# Patient Record
Sex: Female | Born: 1935 | Race: Black or African American | Hispanic: No | Smoking: Former smoker
Health system: Southern US, Community
[De-identification: ages and names within clinical notes are randomized; demographics above are authoritative.]

## PROBLEM LIST (undated history)

## (undated) DIAGNOSIS — F0280 Dementia in other diseases classified elsewhere without behavioral disturbance: Principal | ICD-10-CM

## (undated) DIAGNOSIS — G309 Alzheimer's disease, unspecified: Principal | ICD-10-CM

## (undated) DIAGNOSIS — F0390 Unspecified dementia without behavioral disturbance: Secondary | ICD-10-CM

## (undated) DIAGNOSIS — E7800 Pure hypercholesterolemia, unspecified: Secondary | ICD-10-CM

## (undated) DIAGNOSIS — I2510 Atherosclerotic heart disease of native coronary artery without angina pectoris: Secondary | ICD-10-CM

## (undated) DIAGNOSIS — I10 Essential (primary) hypertension: Secondary | ICD-10-CM

## (undated) HISTORY — DX: Pure hypercholesterolemia, unspecified: E78.00

## (undated) HISTORY — DX: Alzheimer's disease, unspecified: G30.9

## (undated) HISTORY — PX: CHOLECYSTECTOMY: SHX55

## (undated) HISTORY — DX: Dementia in other diseases classified elsewhere without behavioral disturbance: F02.80

---

## 2001-05-15 ENCOUNTER — Ambulatory Visit (HOSPITAL_COMMUNITY): Admission: RE | Admit: 2001-05-15 | Discharge: 2001-05-15 | Payer: Self-pay | Admitting: Family Medicine

## 2001-05-15 ENCOUNTER — Encounter: Payer: Self-pay | Admitting: Family Medicine

## 2001-08-28 ENCOUNTER — Encounter: Payer: Self-pay | Admitting: Family Medicine

## 2001-08-28 ENCOUNTER — Ambulatory Visit (HOSPITAL_COMMUNITY): Admission: RE | Admit: 2001-08-28 | Discharge: 2001-08-28 | Payer: Self-pay | Admitting: Family Medicine

## 2001-09-06 ENCOUNTER — Ambulatory Visit (HOSPITAL_COMMUNITY): Admission: RE | Admit: 2001-09-06 | Discharge: 2001-09-06 | Payer: Self-pay | Admitting: Family Medicine

## 2001-09-06 ENCOUNTER — Encounter: Payer: Self-pay | Admitting: Family Medicine

## 2005-07-05 ENCOUNTER — Emergency Department (HOSPITAL_COMMUNITY): Admission: EM | Admit: 2005-07-05 | Discharge: 2005-07-06 | Payer: Self-pay | Admitting: Emergency Medicine

## 2005-07-06 ENCOUNTER — Ambulatory Visit: Payer: Self-pay | Admitting: Orthopedic Surgery

## 2005-07-15 ENCOUNTER — Ambulatory Visit (HOSPITAL_COMMUNITY): Admission: RE | Admit: 2005-07-15 | Discharge: 2005-07-15 | Payer: Self-pay | Admitting: Orthopedic Surgery

## 2005-07-15 ENCOUNTER — Ambulatory Visit: Payer: Self-pay | Admitting: Orthopedic Surgery

## 2005-07-20 ENCOUNTER — Ambulatory Visit: Payer: Self-pay | Admitting: Orthopedic Surgery

## 2005-07-24 ENCOUNTER — Inpatient Hospital Stay (HOSPITAL_COMMUNITY): Admission: RE | Admit: 2005-07-24 | Discharge: 2005-07-25 | Payer: Self-pay | Admitting: Orthopedic Surgery

## 2006-07-26 ENCOUNTER — Ambulatory Visit (HOSPITAL_COMMUNITY): Admission: RE | Admit: 2006-07-26 | Discharge: 2006-07-26 | Payer: Self-pay | Admitting: Family Medicine

## 2010-09-04 ENCOUNTER — Emergency Department (HOSPITAL_COMMUNITY)
Admission: EM | Admit: 2010-09-04 | Discharge: 2010-09-04 | Disposition: A | Discharge status: Home / Self Care | Payer: Medicare Other | Attending: Emergency Medicine | Admitting: Emergency Medicine

## 2010-09-04 DIAGNOSIS — Z79899 Other long term (current) drug therapy: Secondary | ICD-10-CM | POA: Insufficient documentation

## 2010-09-04 DIAGNOSIS — R509 Fever, unspecified: Secondary | ICD-10-CM | POA: Insufficient documentation

## 2010-09-04 DIAGNOSIS — R109 Unspecified abdominal pain: Secondary | ICD-10-CM | POA: Insufficient documentation

## 2010-09-04 DIAGNOSIS — E789 Disorder of lipoprotein metabolism, unspecified: Secondary | ICD-10-CM | POA: Insufficient documentation

## 2010-09-04 DIAGNOSIS — I10 Essential (primary) hypertension: Secondary | ICD-10-CM | POA: Insufficient documentation

## 2010-09-04 DIAGNOSIS — Z7982 Long term (current) use of aspirin: Secondary | ICD-10-CM | POA: Insufficient documentation

## 2010-09-04 LAB — BASIC METABOLIC PANEL
CO2: 27 mEq/L (ref 19–32)
Calcium: 9.5 mg/dL (ref 8.4–10.5)
Creatinine, Ser: 1.04 mg/dL (ref 0.4–1.2)
GFR calc Af Amer: >60 mL/min (ref >60)
Glucose, Bld: 142 mg/dL — ABNORMAL HIGH (ref 70–99)

## 2010-09-04 LAB — CBC
Hemoglobin: 13.1 g/dL (ref 12.0–15.0)
MCH: 31.3 pg (ref 26.0–34.0)
MCHC: 34.7 g/dL (ref 30.0–36.0)

## 2010-09-04 LAB — DIFFERENTIAL
Basophils Relative: 0 % (ref 0–1)
Eosinophils Relative: 0 % (ref 0–5)
Monocytes Absolute: 0.6 10*3/uL (ref 0.1–1.0)
Monocytes Relative: 6 % (ref 3–12)
Neutro Abs: 7 10*3/uL (ref 1.7–7.7)

## 2010-09-04 LAB — URINALYSIS, ROUTINE W REFLEX MICROSCOPIC
Leukocytes, UA: NEGATIVE
Nitrite: NEGATIVE
Protein, ur: NEGATIVE mg/dL
Urobilinogen, UA: 0.2 mg/dL (ref 0.0–1.0)

## 2010-09-04 LAB — URINE MICROSCOPIC-ADD ON

## 2010-11-24 ENCOUNTER — Other Ambulatory Visit: Payer: Self-pay | Admitting: Neurology

## 2010-11-24 ENCOUNTER — Other Ambulatory Visit (HOSPITAL_COMMUNITY): Payer: Self-pay | Admitting: "Endocrinology

## 2010-11-24 DIAGNOSIS — R7989 Other specified abnormal findings of blood chemistry: Secondary | ICD-10-CM

## 2010-11-24 DIAGNOSIS — F0390 Unspecified dementia without behavioral disturbance: Secondary | ICD-10-CM

## 2010-11-24 DIAGNOSIS — R4182 Altered mental status, unspecified: Secondary | ICD-10-CM

## 2010-11-25 ENCOUNTER — Encounter (HOSPITAL_COMMUNITY)
Admission: RE | Admit: 2010-11-25 | Discharge: 2010-11-25 | Disposition: A | Discharge status: Home / Self Care | Payer: Medicare Other | Source: Ambulatory Visit | Attending: "Endocrinology | Admitting: "Endocrinology

## 2010-11-25 ENCOUNTER — Encounter (HOSPITAL_COMMUNITY): Payer: Self-pay

## 2010-11-25 DIAGNOSIS — R7989 Other specified abnormal findings of blood chemistry: Secondary | ICD-10-CM

## 2010-11-25 DIAGNOSIS — E349 Endocrine disorder, unspecified: Secondary | ICD-10-CM | POA: Insufficient documentation

## 2010-11-25 HISTORY — DX: Essential (primary) hypertension: I10

## 2010-11-25 MED ORDER — SODIUM IODIDE I 131 CAPSULE
10.0000 | Freq: Once | INTRAVENOUS | Status: AC | PRN
  Administered 2010-11-25: 9 via ORAL

## 2010-11-26 ENCOUNTER — Encounter (HOSPITAL_COMMUNITY)
Admission: RE | Admit: 2010-11-26 | Discharge: 2010-11-26 | Disposition: A | Discharge status: Home / Self Care | Payer: Medicare Other | Source: Ambulatory Visit | Attending: "Endocrinology | Admitting: "Endocrinology

## 2010-11-26 MED ORDER — SODIUM PERTECHNETATE TC 99M INJECTION
10.0000 | Freq: Once | INTRAVENOUS | Status: AC | PRN
  Administered 2010-11-26: 9.6 via INTRAVENOUS

## 2010-12-06 ENCOUNTER — Ambulatory Visit (HOSPITAL_COMMUNITY)
Admission: RE | Admit: 2010-12-06 | Discharge: 2010-12-06 | Disposition: A | Discharge status: Home / Self Care | Payer: Medicare Other | Source: Ambulatory Visit | Attending: Neurology | Admitting: Neurology

## 2010-12-06 DIAGNOSIS — R5383 Other fatigue: Secondary | ICD-10-CM | POA: Insufficient documentation

## 2010-12-06 DIAGNOSIS — R4182 Altered mental status, unspecified: Secondary | ICD-10-CM

## 2010-12-06 DIAGNOSIS — R51 Headache: Secondary | ICD-10-CM | POA: Insufficient documentation

## 2010-12-06 DIAGNOSIS — R5381 Other malaise: Secondary | ICD-10-CM | POA: Insufficient documentation

## 2010-12-06 DIAGNOSIS — F0390 Unspecified dementia without behavioral disturbance: Secondary | ICD-10-CM

## 2010-12-11 ENCOUNTER — Emergency Department (HOSPITAL_COMMUNITY): Payer: Medicare Other

## 2010-12-11 ENCOUNTER — Observation Stay (HOSPITAL_COMMUNITY)
Admission: EM | Admit: 2010-12-11 | Discharge: 2010-12-12 | DRG: 645 | Disposition: A | Discharge status: Home / Self Care | Payer: Medicare Other | Attending: Internal Medicine | Admitting: Internal Medicine

## 2010-12-11 DIAGNOSIS — E785 Hyperlipidemia, unspecified: Secondary | ICD-10-CM | POA: Diagnosis present

## 2010-12-11 DIAGNOSIS — E0590 Thyrotoxicosis, unspecified without thyrotoxic crisis or storm: Principal | ICD-10-CM | POA: Diagnosis present

## 2010-12-11 DIAGNOSIS — Z79899 Other long term (current) drug therapy: Secondary | ICD-10-CM | POA: Insufficient documentation

## 2010-12-11 DIAGNOSIS — F0390 Unspecified dementia without behavioral disturbance: Secondary | ICD-10-CM | POA: Diagnosis present

## 2010-12-11 DIAGNOSIS — Z91199 Patient's noncompliance with other medical treatment and regimen due to unspecified reason: Secondary | ICD-10-CM

## 2010-12-11 DIAGNOSIS — Z9119 Patient's noncompliance with other medical treatment and regimen: Secondary | ICD-10-CM

## 2010-12-11 DIAGNOSIS — I498 Other specified cardiac arrhythmias: Secondary | ICD-10-CM | POA: Diagnosis present

## 2010-12-11 DIAGNOSIS — F411 Generalized anxiety disorder: Secondary | ICD-10-CM | POA: Diagnosis present

## 2010-12-11 DIAGNOSIS — E860 Dehydration: Secondary | ICD-10-CM | POA: Diagnosis present

## 2010-12-11 DIAGNOSIS — I10 Essential (primary) hypertension: Secondary | ICD-10-CM | POA: Diagnosis present

## 2010-12-11 LAB — DIFFERENTIAL
Basophils Absolute: 0 10*3/uL (ref 0.0–0.1)
Basophils Relative: 0 % (ref 0–1)
Eosinophils Relative: 1 % (ref 0–5)
Monocytes Absolute: 0.5 10*3/uL (ref 0.1–1.0)
Neutro Abs: 4.9 10*3/uL (ref 1.7–7.7)

## 2010-12-11 LAB — URINALYSIS, ROUTINE W REFLEX MICROSCOPIC
Ketones, ur: NEGATIVE mg/dL
Leukocytes, UA: NEGATIVE
Nitrite: NEGATIVE
Specific Gravity, Urine: <1.005 — ABNORMAL LOW (ref 1.005–1.030)
pH: 6 (ref 5.0–8.0)

## 2010-12-11 LAB — CBC
MCHC: 33.9 g/dL (ref 30.0–36.0)
RDW: 12.1 % (ref 11.5–15.5)

## 2010-12-11 LAB — URINE MICROSCOPIC-ADD ON

## 2010-12-11 LAB — COMPREHENSIVE METABOLIC PANEL
CO2: 27 mEq/L (ref 19–32)
Calcium: 10.6 mg/dL — ABNORMAL HIGH (ref 8.4–10.5)
Creatinine, Ser: 1.17 mg/dL (ref 0.4–1.2)
GFR calc non Af Amer: 45 mL/min — ABNORMAL LOW (ref >60)
Glucose, Bld: 154 mg/dL — ABNORMAL HIGH (ref 70–99)

## 2010-12-11 LAB — CK TOTAL AND CKMB (NOT AT ARMC)
CK, MB: 3.3 ng/mL (ref 0.3–4.0)
Total CK: 134 U/L (ref 7–177)

## 2010-12-11 LAB — CARDIAC PANEL(CRET KIN+CKTOT+MB+TROPI): Relative Index: 2.4 (ref 0.0–2.5)

## 2010-12-12 LAB — CBC
HCT: 36.9 % (ref 36.0–46.0)
Platelets: 220 10*3/uL (ref 150–400)
RDW: 12.1 % (ref 11.5–15.5)
WBC: 7.2 10*3/uL (ref 4.0–10.5)

## 2010-12-12 LAB — COMPREHENSIVE METABOLIC PANEL
ALT: 9 U/L (ref 0–35)
Albumin: 3.5 g/dL (ref 3.5–5.2)
Alkaline Phosphatase: 58 U/L (ref 39–117)
Glucose, Bld: 160 mg/dL — ABNORMAL HIGH (ref 70–99)
Potassium: 3.8 mEq/L (ref 3.5–5.1)
Sodium: 139 mEq/L (ref 135–145)
Total Protein: 7 g/dL (ref 6.0–8.3)

## 2010-12-12 LAB — CARDIAC PANEL(CRET KIN+CKTOT+MB+TROPI)
CK, MB: 3.1 ng/mL (ref 0.3–4.0)
CK, MB: 2.9 ng/mL (ref 0.3–4.0)
Relative Index: 2.7 — ABNORMAL HIGH (ref 0.0–2.5)
Relative Index: 2.4 (ref 0.0–2.5)
Total CK: 116 U/L (ref 7–177)

## 2010-12-12 LAB — DIFFERENTIAL
Basophils Absolute: 0 10*3/uL (ref 0.0–0.1)
Basophils Relative: 0 % (ref 0–1)
Eosinophils Absolute: 0 10*3/uL (ref 0.0–0.7)
Eosinophils Relative: 0 % (ref 0–5)
Lymphocytes Relative: 27 % (ref 12–46)

## 2010-12-15 NOTE — H&P (Signed)
NAMECARLOYN, LAHUE            ACCOUNT NO.:  [id number]  MEDICAL RECORD NO.:  [id number]           PATIENT TYPE:  I  LOCATION:  A303                          FACILITY:  [facility]  PHYSICIAN:  Talmage Nap, MD  DATE OF BIRTH:  May 14, 1930  DATE OF ADMISSION:  12/11/2010 DATE OF DISCHARGE:  LH                             HISTORY & PHYSICAL   PRIMARY CARE PHYSICIAN:  Madelin Rear. Sherwood Gambler, MD at [facility].  UROLOGIST:  Ky Barban, MD  History obtainable from the patient and the patient's spouse.  CHIEF COMPLAINT:  Hotness, especially on the forehead of unspecified duration.  HISTORY OF PRESENT ILLNESS:  The patient is a 77 year old African- American female with history of hypertension, anxiety disorder, questionable hyperparathyroidism, presenting to the emergency room with complaints of hardness in the forehead of unspecified duration and this has been said to be on and off.  The hotness on forehead is said to be associated with headaches, but there is no history of neck stiffness. There is no history of blurred vision.  No nausea.  No vomiting.  No fever.  No chills.  No rigor.  No chest pain or shortness of breath. Symptoms were said to have persisted and got progressively worse in the past 48 hours.  Hence the patient presented to the emergency room to be evaluated.  PAST MEDICAL HISTORY:  Positive for anxiety disorder, hyperlipidemia, hypertension, questionable hyperparathyroidism.  PAST SURGICAL HISTORY:  Open cholecystectomy.  MEDICATIONS:  Preadmission meds include, 1. Benicar, dose unknown. 2. Donepezil 5 mg p.o. at bedtime. 3. Methimazole 5 mg, frequency unknown. 4. Propranolol 20 mg p.o. b.i.d. 5. Zolpidem 5 mg p.r.n.  ALLERGIES:  No known allergies.  SOCIAL HISTORY:  Negative for alcohol or tobacco use.  The patient is retired.  FAMILY HISTORY:  York Spaniel to be positive for hypertension.  REVIEW OF SYSTEMS:  The patient complained of hotness in  the head with headaches.  Denies any history of neck stiffness.  No nausea or vomiting.  No fever.  No chills.  No rigor.  No chest pain or shortness of breath.  No cough.  No abdominal discomfort.  No diarrhea or hematochezia.  No dysuria or hematuria.  No swelling of the lower extremities.  No intolerance to heat or cold.  No neuropsychiatric disorder.  PHYSICAL EXAMINATION:  GENERAL:  Elderly lady, not in any acute respiratory distress, suboptimal hydration. VITAL SIGNS:  Blood pressure is 156/70, pulse is 65, respiratory rate is 16, temperature is 98.6. HEENT:  Pupils are reactive to light and extraocular muscles are intact. NECK:  No jugular venous distention.  No carotid bruit.  No lymphadenopathy. CHEST:  Clear to auscultation. HEART:  Heart sounds are 1 and 2 distant and bradycardic. ABDOMEN:  Soft and nontender.  Liver, spleen, kidney are not palpable. Bowel sounds are positive. EXTREMITIES:  No pedal edema. NEUROLOGIC:  Nonfocal. MUSCULOSKELETAL SYSTEM:  Shows arthritic changes in the knees and feet. NEUROPSYCHIATRIC:  Evaluation is unremarkable. SKIN:  Showed decreased turgor.  LABORATORY DATA:  Urinalysis showed the urine, wbc 3-6 and rbc 3-6 and negative urine nitrite and leukocyte esterase.  Chemistry shows sodium of  134, potassium of 3.6, chloride of 97 with a bicarb of 27, glucose is 154, BUN is 18, creatinine is 1.17.  LFT normal.  Hematological indices showed WBC of 7.8, hemoglobin of 12.5, hematocrit of 36.9, MCV of 90.2, platelet count of 263.  Normal differentials.  First set of cardiac markers troponin-I less than 0.30.  EKG showed a sinus bradycardia with a rate of 51 with nonspecific T-wave abnormalities in anterolateral leads.  IMPRESSION: 1. Hotness (forehead), questionable secondary to thyroid disorder. 2. Asymptomatic bradycardia. 3. Dehydration. 4. Hypertension. 5. Anxiety disorder. 6. Hyperlipidemia.  PLAN:  To admit the patient to telemetry  for continuous monitoring of the slow heart rate.  She will be slowly rehydrated with half-normal saline IV to go at rate of 65 mL an hour.  Blood pressure will be controlled with Benicar 20 mg p.o. daily, propranolol and methimazole have been hold for now.  She will be on Protonix 40 mg p.o. daily for GI prophylaxis and Lovenox 40 mg subcu q. 24 for DVT prophylaxis.  Further labs to be ordered on this patient will include TSH, T3, T4, cardiac enzymes q. 6 x3.  CBC, CMP, and magnesium will be repeated in a.m.  The patient will be followed and evaluated on daily basis.     Talmage Nap, MD    CN/MEDQ  D:  12/11/2010  T:  12/11/2010  Job:  [id number]  Electronically Signed by Talmage Nap  on 12/15/2010 11:41:21 AM

## 2010-12-23 NOTE — Discharge Summary (Signed)
[name], [name]            ACCOUNT NO.:  [id number]  MEDICAL RECORD NO.:  [id number]           PATIENT TYPE:  I  LOCATION:  A303                          FACILITY:  [facility]  PHYSICIAN:  Chamberlain Steinborn L. Lendell Caprice, MDDATE OF BIRTH:  24-Aug-1929  DATE OF ADMISSION:  12/11/2010 DATE OF DISCHARGE:  05/27/2012LH                              DISCHARGE SUMMARY   DISCHARGE DIAGNOSES: 1. Resolved bradycardia. 2. Thyrotoxicosis. 3. Noncompliance. 4. Reported history of dementia. 5. Anxiety. 6. Hypertension. 7. Hyperlipidemia. 8. Dehydration.  DISCHARGE MEDICATIONS: 1. She is encouraged to take her methimazole 5 mg a day which her     family reports she has not been taking. 2. Stop propranolol (though I doubt she has been taking this). 3. Zolpidem 5 mg nightly as needed for sleep. 4. Benicar HCT 40/25 mg a day. 5. Donepezil 5 mg a day. 6. Alprazolam 0.5 mg p.o. q.6 hours p.r.n. anxiety.  CONDITION:  Stable.  ACTIVITY:  Ad lib.  FOLLOWUP:  With Dr. Fransico Him, next week.  CONSULTATIONS:  None.  PROCEDURES:  None.  DIET:  Should be heart-healthy.  LABORATORY DATA:  TSH, free T4 pending.  Cardiac enzymes negative. Complete metabolic panel significant for glucose of 154 on admission, otherwise unremarkable CBC normal.  Urinalysis showed specific gravity less than 1.005, small blood.  Otherwise essentially negative.  DIAGNOSTICS:  Chest x-ray negative.  EKG showed sinus bradycardia with a rate of 51.  HISTORY AND HOSPITAL COURSE:  Please see H and P for details.  [name] is a 77 year old black female who apparently was diagnosed with hyperthyroidism earlier this more month.  Family tells me it was being worked up by Dr. Fransico Him.  She had thyroid uptake scan earlier this month which showed no hyperfunctioning or hypofunctioning nodules.  The patient presented with feeling hot.  The family reports she has a history of "dementia."  During the hospitalization, she took out her IV and  to off her telemetry leads and was wandering.  I suspect her heat intolerance is related to the noncompliance with her methimazole.  Some of her behavioral problems could be related as well.  At this point, she is more difficult to redirect and what she would be at home and she is medically stabilized, so the rest of her workup can be followed up as an outpatient.  I suspect this is simply secondary to noncompliance and I have asked the family members to assist with her medication compliance. I have told her to stop her propranolol for now due to the mild bradycardia.  This will need to be readdressed by Dr. Fransico Him, and I have asked her to schedule an appointment next week.  Apparently, the patient's husband and son help with her medical issues.  She has had no further bradycardia.  She is ruled out for MI.  She feels well otherwise and wants to go home.  Total time on the day of discharge is greater than 30 minutes.     Koltin Wehmeyer L. Lendell Caprice, MD     CLS/MEDQ  D:  12/12/2010  T:  12/13/2010  Job:  [id number]  cc:   Madelin Rear. Sherwood Gambler, MD  Fax: [phone]  Dr. Darliss Cheney  Electronically Signed by Crista Curb MD on 12/23/2010 07:25:47 AM

## 2011-04-11 ENCOUNTER — Other Ambulatory Visit (HOSPITAL_COMMUNITY): Payer: Self-pay | Admitting: Internal Medicine

## 2011-04-11 DIAGNOSIS — Z139 Encounter for screening, unspecified: Secondary | ICD-10-CM

## 2011-04-14 ENCOUNTER — Ambulatory Visit (HOSPITAL_COMMUNITY)
Admission: RE | Admit: 2011-04-14 | Discharge: 2011-04-14 | Disposition: A | Discharge status: Home / Self Care | Payer: Medicare Other | Source: Ambulatory Visit | Attending: Internal Medicine | Admitting: Internal Medicine

## 2011-04-14 DIAGNOSIS — Z139 Encounter for screening, unspecified: Secondary | ICD-10-CM

## 2011-04-14 DIAGNOSIS — Z13820 Encounter for screening for osteoporosis: Secondary | ICD-10-CM | POA: Insufficient documentation

## 2011-04-14 DIAGNOSIS — Z1231 Encounter for screening mammogram for malignant neoplasm of breast: Secondary | ICD-10-CM | POA: Insufficient documentation

## 2011-04-14 DIAGNOSIS — Z780 Asymptomatic menopausal state: Secondary | ICD-10-CM | POA: Insufficient documentation

## 2011-04-14 DIAGNOSIS — M818 Other osteoporosis without current pathological fracture: Secondary | ICD-10-CM | POA: Insufficient documentation

## 2011-06-02 ENCOUNTER — Other Ambulatory Visit (HOSPITAL_COMMUNITY): Payer: Self-pay | Admitting: Internal Medicine

## 2011-06-02 DIAGNOSIS — Z139 Encounter for screening, unspecified: Secondary | ICD-10-CM

## 2011-06-27 ENCOUNTER — Ambulatory Visit: Payer: Medicare Other | Admitting: Gastroenterology

## 2011-06-27 ENCOUNTER — Telehealth: Payer: Self-pay | Admitting: Gastroenterology

## 2011-06-27 NOTE — Telephone Encounter (Signed)
Pt was a no show

## 2011-06-27 NOTE — Telephone Encounter (Signed)
Let referring MD know.

## 2011-08-17 ENCOUNTER — Encounter (INDEPENDENT_AMBULATORY_CARE_PROVIDER_SITE_OTHER): Payer: Self-pay | Admitting: *Deleted

## 2011-08-30 ENCOUNTER — Encounter (INDEPENDENT_AMBULATORY_CARE_PROVIDER_SITE_OTHER): Payer: Self-pay | Admitting: Internal Medicine

## 2011-08-30 ENCOUNTER — Telehealth (INDEPENDENT_AMBULATORY_CARE_PROVIDER_SITE_OTHER): Payer: Self-pay | Admitting: *Deleted

## 2011-08-30 ENCOUNTER — Other Ambulatory Visit (INDEPENDENT_AMBULATORY_CARE_PROVIDER_SITE_OTHER): Payer: Self-pay | Admitting: *Deleted

## 2011-08-30 ENCOUNTER — Ambulatory Visit (INDEPENDENT_AMBULATORY_CARE_PROVIDER_SITE_OTHER): Payer: Medicare Other | Admitting: Internal Medicine

## 2011-08-30 DIAGNOSIS — Z1211 Encounter for screening for malignant neoplasm of colon: Secondary | ICD-10-CM

## 2011-08-30 DIAGNOSIS — E7800 Pure hypercholesterolemia, unspecified: Secondary | ICD-10-CM | POA: Insufficient documentation

## 2011-08-30 DIAGNOSIS — I10 Essential (primary) hypertension: Secondary | ICD-10-CM | POA: Insufficient documentation

## 2011-08-30 DIAGNOSIS — E119 Type 2 diabetes mellitus without complications: Secondary | ICD-10-CM | POA: Insufficient documentation

## 2011-08-30 MED ORDER — PEG-KCL-NACL-NASULF-NA ASC-C 100 G PO SOLR: 1.0000 | Freq: Once | ORAL | Status: DC

## 2011-08-30 NOTE — Telephone Encounter (Signed)
Patient needs movi prep 

## 2011-08-30 NOTE — Patient Instructions (Addendum)
Will schedule a colonoscopy with Dr. Rehman 

## 2011-08-30 NOTE — Progress Notes (Signed)
Subjective:     Patient ID: [name], female   DOB: May 06, 1930, 78 y.o.   MRN: [id number]  HPI [name] is a referral from [facility] because her stomach growls.  She says her abdomen is very loud.  She denies any pain. Appetite is good. She says she has lost weight intentionally. She occasionally has burping.  She has a BM about every 2 days. Stools are normal in size. Sometimes her stools are pencil like.  No melena or bright red rectal bleeding. No pain with her BMs.   Her husband states her last colonoscopy was greater than 10 yrs ago by Dr. Katrinka Blazing and was normal.  Review of Systems see hpi  Current Outpatient Prescriptions  Medication Sig Dispense Refill  . ALPRAZolam (XANAX) 0.5 MG tablet Take 0.5 mg by mouth at bedtime as needed.      Marland Kitchen aspirin 81 MG tablet Take 81 mg by mouth daily.      Marland Kitchen donepezil (ARICEPT) 5 MG tablet Take 5 mg by mouth at bedtime as needed.      . lactulose (CHRONULAC) 10 GM/15ML solution Take 20 g by mouth 3 (three) times daily. As needed      . olmesartan-hydrochlorothiazide (BENICAR HCT) 40-25 MG per tablet Take 1 tablet by mouth daily.      . propranolol (INDERAL) 20 MG tablet Take 20 mg by mouth 3 (three) times daily.      . simvastatin (ZOCOR) 20 MG tablet Take 20 mg by mouth every evening.      . zolpidem (AMBIEN) 5 MG tablet Take 5 mg by mouth at bedtime as needed.       Medications:  Methimazole 5mg  daily Past Medical History  Diagnosis Date  . Hypertension   . High cholesterol   . Diabetes mellitus    Past Surgical History  Procedure Date  . Cholecystectomy    Family Status  Relation Status Death Age  . Mother Deceased     natural  . Father Deceased     unknown  . Sister Deceased   . Brother Other     Patient really cannot give health status on her brothers   History   Social History  . Marital Status: Married    Spouse Name: N/A    Number of Children: N/A  . Years of Education: N/A   Occupational History  . Not on  file.   Social History Main Topics  . Smoking status: Former Games developer  . Smokeless tobacco: Not on file  . Alcohol Use: No  . Drug Use: No  . Sexually Active: Not on file   Other Topics Concern  . Not on file   Social History Narrative  . No narrative on file   No Known Allergies   Objective:   Physical Exam  Filed Vitals:   08/30/11 1035  Height: 5\' 9"  (1.753 m)  Weight: 169 lb 4.8 oz (76.794 kg)   Alert and oriented. Skin warm and dry. Oral mucosa is moist.   . Sclera anicteric, conjunctivae is pink. Thyroid not enlarged. No cervical lymphadenopathy. Lungs clear. Heart regular rate and rhythm.  Abdomen is soft. Bowel sounds are positive. No hepatomegaly. No abdominal masses felt. No tenderness.  No edema to lower extremities. Patient is alert and oriented.      Assessment:       Normal Exam. Colonoscopy greater than 10 yrs ago.  Plan:    Will schedule a colonoscopy with Dr. Karilyn Cota.    The  risks and benefits such as perforation, bleeding, and infection were reviewed with the patient and is agreeable.

## 2011-09-26 ENCOUNTER — Telehealth (INDEPENDENT_AMBULATORY_CARE_PROVIDER_SITE_OTHER): Payer: Self-pay | Admitting: *Deleted

## 2011-09-26 ENCOUNTER — Encounter (HOSPITAL_COMMUNITY): Payer: Self-pay | Admitting: Pharmacy Technician

## 2011-09-26 NOTE — Telephone Encounter (Signed)
Patient states pharmacy didn't have prep kit, please re-send

## 2011-09-27 MED ORDER — PEG-KCL-NACL-NASULF-NA ASC-C 100 G PO SOLR: 1.0000 | Freq: Once | ORAL | Status: DC

## 2011-09-27 MED ORDER — SODIUM CHLORIDE 0.45 % IV SOLN
Freq: Once | INTRAVENOUS | Status: AC
  Administered 2011-09-28: 1000 mL via INTRAVENOUS

## 2011-09-28 ENCOUNTER — Ambulatory Visit (HOSPITAL_COMMUNITY)
Admission: RE | Admit: 2011-09-28 | Discharge: 2011-09-28 | Disposition: A | Discharge status: Home / Self Care | Payer: Medicare Other | Source: Ambulatory Visit | Attending: Internal Medicine | Admitting: Internal Medicine

## 2011-09-28 ENCOUNTER — Encounter (HOSPITAL_COMMUNITY)
Admission: RE | Disposition: A | Discharge status: Home / Self Care | Payer: Self-pay | Source: Ambulatory Visit | Attending: Internal Medicine

## 2011-09-28 ENCOUNTER — Encounter (HOSPITAL_COMMUNITY): Payer: Self-pay | Admitting: *Deleted

## 2011-09-28 DIAGNOSIS — D128 Benign neoplasm of rectum: Secondary | ICD-10-CM

## 2011-09-28 DIAGNOSIS — Z1211 Encounter for screening for malignant neoplasm of colon: Secondary | ICD-10-CM

## 2011-09-28 DIAGNOSIS — Z7982 Long term (current) use of aspirin: Secondary | ICD-10-CM | POA: Insufficient documentation

## 2011-09-28 DIAGNOSIS — Z79899 Other long term (current) drug therapy: Secondary | ICD-10-CM | POA: Insufficient documentation

## 2011-09-28 DIAGNOSIS — I10 Essential (primary) hypertension: Secondary | ICD-10-CM | POA: Insufficient documentation

## 2011-09-28 DIAGNOSIS — D129 Benign neoplasm of anus and anal canal: Secondary | ICD-10-CM

## 2011-09-28 DIAGNOSIS — E119 Type 2 diabetes mellitus without complications: Secondary | ICD-10-CM | POA: Insufficient documentation

## 2011-09-28 DIAGNOSIS — E7800 Pure hypercholesterolemia, unspecified: Secondary | ICD-10-CM | POA: Insufficient documentation

## 2011-09-28 DIAGNOSIS — K644 Residual hemorrhoidal skin tags: Secondary | ICD-10-CM

## 2011-09-28 DIAGNOSIS — K5730 Diverticulosis of large intestine without perforation or abscess without bleeding: Secondary | ICD-10-CM

## 2011-09-28 HISTORY — PX: COLONOSCOPY: SHX5424

## 2011-09-28 SURGERY — COLONOSCOPY
Anesthesia: Moderate Sedation

## 2011-09-28 MED ORDER — NYSTATIN-TRIAMCINOLONE 100000-0.1 UNIT/GM-% EX OINT: TOPICAL_OINTMENT | Freq: Two times a day (BID) | CUTANEOUS | Status: DC

## 2011-09-28 MED ORDER — MEPERIDINE HCL 50 MG/ML IJ SOLN
INTRAMUSCULAR | Status: AC
  Filled 2011-09-28: qty 1

## 2011-09-28 MED ORDER — MIDAZOLAM HCL 5 MG/5ML IJ SOLN
INTRAMUSCULAR | Status: DC | PRN
  Administered 2011-09-28 (×2): 2 mg via INTRAVENOUS
  Administered 2011-09-28: 1 mg via INTRAVENOUS

## 2011-09-28 MED ORDER — STERILE WATER FOR IRRIGATION IR SOLN
Status: DC | PRN
  Administered 2011-09-28: 11:00:00

## 2011-09-28 MED ORDER — MIDAZOLAM HCL 5 MG/5ML IJ SOLN
INTRAMUSCULAR | Status: AC
  Filled 2011-09-28: qty 10

## 2011-09-28 MED ORDER — MEPERIDINE HCL 50 MG/ML IJ SOLN
INTRAMUSCULAR | Status: DC | PRN
  Administered 2011-09-28 (×2): 25 mg via INTRAVENOUS

## 2011-09-28 NOTE — Discharge Instructions (Signed)
Resume usual medications and high fiber diet. Mycolog-II cream to be applied to anal canal twice daily for 10 days and then on as-needed basis. No driving for 24 hours. Physician will contact you with biopsy results.  High Fiber Diet A high fiber diet changes your normal diet to include more whole grains, legumes, fruits, and vegetables. Changes in the diet involve replacing refined carbohydrates with unrefined foods. The calorie level of the diet is essentially unchanged. The Dietary Reference Intake (recommended amount) for adult males is 38 g per day. For adult females, it is 25 g per day. Pregnant and lactating women should consume 28 g of fiber per day. Fiber is the intact part of a plant that is not broken down during digestion. Functional fiber is fiber that has been isolated from the plant to provide a beneficial effect in the body. PURPOSE  Increase stool bulk.   Ease and regulate bowel movements.   Lower cholesterol.  INDICATIONS THAT YOU NEED MORE FIBER  Constipation and hemorrhoids.   Uncomplicated diverticulosis (intestine condition) and irritable bowel syndrome.   Weight management.   As a protective measure against hardening of the arteries (atherosclerosis), diabetes, and cancer.  NOTE OF CAUTION If you have a digestive or bowel problem, ask your caregiver for advice before adding high fiber foods to your diet. Some of the following medical problems are such that a high fiber diet should not be used without consulting your caregiver:  Acute diverticulitis (intestine infection).   Partial small bowel obstructions.   Complicated diverticular disease involving bleeding, rupture (perforation), or abscess (boil, furuncle).   Presence of autonomic neuropathy (nerve damage) or gastric paresis (stomach cannot empty itself).  GUIDELINES FOR INCREASING FIBER  Start adding fiber to the diet slowly. A gradual increase of about 5 more grams (2 slices of whole-wheat bread, 2  servings of most fruits or vegetables, or 1 bowl of high fiber cereal) per day is best. Too rapid an increase in fiber may result in constipation, flatulence, and bloating.   Drink enough water and fluids to keep your urine clear or pale yellow. Water, juice, or caffeine-free drinks are recommended. Not drinking enough fluid may cause constipation.   Eat a variety of high fiber foods rather than one type of fiber.   Try to increase your intake of fiber through using high fiber foods rather than fiber pills or supplements that contain small amounts of fiber.   The goal is to change the types of food eaten. Do not supplement your present diet with high fiber foods, but replace foods in your present diet.  INCLUDE A VARIETY OF FIBER SOURCES  Replace refined and processed grains with whole grains, canned fruits with fresh fruits, and incorporate other fiber sources. White rice, white breads, and most bakery goods contain little or no fiber.   Brown whole-grain rice, buckwheat oats, and many fruits and vegetables are all good sources of fiber. These include: broccoli, Brussels sprouts, cabbage, cauliflower, beets, sweet potatoes, white potatoes (skin on), carrots, tomatoes, eggplant, squash, berries, fresh fruits, and dried fruits.   Cereals appear to be the richest source of fiber. Cereal fiber is found in whole grains and bran. Bran is the fiber-rich outer coat of cereal grain, which is largely removed in refining. In whole-grain cereals, the bran remains. In breakfast cereals, the largest amount of fiber is found in those with "bran" in their names. The fiber content is sometimes indicated on the label.   You may need to include  additional fruits and vegetables each day.   In baking, for 1 cup white flour, you may use the following substitutions:   1 cup whole-wheat flour minus 2 tbs.    cup white flour plus  cup whole-wheat flour.  Document Released: 07/04/2005 Document Revised: 06/23/2011  Document Reviewed: 05/12/2009 [facility] Patient Information 2012 [place], [place].   Colonoscopy Care After Read the instructions outlined below and refer to this sheet in the next few weeks. These discharge instructions provide you with general information on caring for yourself after you leave the hospital. Your doctor may also give you specific instructions. While your treatment has been planned according to the most current medical practices available, unavoidable complications occasionally occur. If you have any problems or questions after discharge, call your doctor. HOME CARE INSTRUCTIONS ACTIVITY:  You may resume your regular activity, but move at a slower pace for the next 24 hours.   Take frequent rest periods for the next 24 hours.   Walking will help get rid of the air and reduce the bloated feeling in your belly (abdomen).   No driving for 24 hours (because of the medicine (anesthesia) used during the test).   You may shower.   Do not sign any important legal documents or operate any machinery for 24 hours (because of the anesthesia used during the test).  NUTRITION:  Drink plenty of fluids.   You may resume your normal diet as instructed by your doctor.   Begin with a light meal and progress to your normal diet. Heavy or fried foods are harder to digest and may make you feel sick to your stomach (nauseated).   Avoid alcoholic beverages for 24 hours or as instructed.  MEDICATIONS:  You may resume your normal medications unless your doctor tells you otherwise.  WHAT TO EXPECT TODAY:  Some feelings of bloating in the abdomen.   Passage of more gas than usual.   Spotting of blood in your stool or on the toilet paper.  IF YOU HAD POLYPS REMOVED DURING THE COLONOSCOPY:  No aspirin products for 7 days or as instructed.   No alcohol for 7 days or as instructed.   Eat a soft diet for the next 24 hours.  FINDING OUT THE RESULTS OF YOUR TEST Not all test results are  available during your visit. If your test results are not back during the visit, make an appointment with your caregiver to find out the results. Do not assume everything is normal if you have not heard from your caregiver or the medical facility. It is important for you to follow up on all of your test results.  SEEK IMMEDIATE MEDICAL CARE IF:  You have more than a spotting of blood in your stool.   Your belly is swollen (abdominal distention).   You are nauseated or vomiting.   You have a fever.   You have abdominal pain or discomfort that is severe or gets worse throughout the day.  Document Released: 02/16/2004 Document Revised: 06/23/2011 Document Reviewed: 02/14/2008 [facility] Patient Information 2012 [place], [place].

## 2011-09-28 NOTE — H&P (Signed)
[name] is an 78 y.o. female.   Chief Complaint: patient is here for colonoscopy. HPI: patient is 78 year old female who is in for screening colonoscopy.Last  xam was over 10 years ago. She complains of abdominal rumbling and constipation. She denies rectal bleeding melena anorexia or weight loss. Family history is negative for colorectal carcinoma. Please see office note from 08/30/2011 for this medications.  Past Medical History  Diagnosis Date  . Hypertension   . High cholesterol   . Diabetes mellitus     Past Surgical History  Procedure Date  . Cholecystectomy     History reviewed. No pertinent family history. Social History:  reports that she has quit smoking. She does not have any smokeless tobacco history on file. She reports that she does not drink alcohol or use illicit drugs.  Allergies: No Known Allergies  Medications Prior to Admission  Medication Dose Route Frequency Provider Last Rate Last Dose  . 0.45 % sodium chloride infusion   Intravenous Once Malissa Hippo, MD 20 mL/hr at 09/28/11 1111 1,000 mL at 09/28/11 1111  . meperidine (DEMEROL) 50 MG/ML injection           . midazolam (VERSED) 5 MG/5ML injection           . simethicone susp in sterile water 1000 mL irrigation    PRN Malissa Hippo, MD       No current outpatient prescriptions on file as of 09/28/2011.    No results found for this or any previous visit (from the past 48 hour(s)). No results found.  Review of Systems  Constitutional: Negative for weight loss.  Gastrointestinal: Negative for abdominal pain, diarrhea, constipation, blood in stool and melena.    Blood pressure 164/84, pulse 105, temperature 97.9 F (36.6 C), temperature source Oral, resp. rate 20, height 5\' 4"  (1.626 m), weight 167 lb (75.751 kg), SpO2 100.00%. Physical Exam  Constitutional: She appears well-developed and well-nourished.  HENT:  Mouth/Throat: Oropharynx is clear and moist.  Eyes: Conjunctivae are normal.  No scleral icterus.  Neck: No thyromegaly present.  Cardiovascular: Normal rate, regular rhythm and normal heart sounds.   No murmur heard. Respiratory: Effort normal and breath sounds normal.  GI: Soft. She exhibits no distension. There is no tenderness.  Musculoskeletal: She exhibits no edema.  Lymphadenopathy:    She has no cervical adenopathy.  Neurological: She is alert.  Skin: Skin is warm and dry.     Assessment/Plan Average risk screening colonoscopy.  Truxton Stupka U 09/28/2011, 11:26 AM

## 2011-09-28 NOTE — Op Note (Addendum)
COLONOSCOPY PROCEDURE REPORT  PATIENT:  [name]  MR#:  [id number] Birthdate:  05/19/1930, 78 y.o., female Endoscopist:  Dr. Malissa Hippo, MD Referred By:  Dr. Madelin Rear. Sherwood Gambler, MD Procedure Date: 09/28/2011  Procedure:   Colonoscopy  Indications: patient is 78 year old African female who is undergoing colonoscopy for screening purposes. He complains of abdominal rumbling and has chronic constipation for which she is on lactulose.  Informed Consent:  Procedure and risks were reviewed with the patient and informed consent was obtained.  Medications:  Demerol 50 mg IV Versed 5 mg IV  Description of procedure:  After a digital rectal exam was performed, that colonoscope was advanced from the anus through the rectum and colon to the area of the cecum, ileocecal valve and appendiceal orifice. The cecum was deeply intubated. These structures were well-seen and photographed for the record. From the level of the cecum and ileocecal valve, the scope was slowly and cautiously withdrawn. The mucosal surfaces were carefully surveyed utilizing scope tip to flexion to facilitate fold flattening as needed. The scope was pulled down into the rectum where a thorough exam including retroflexion was performed.  Findings:   Prep excellent. Few small diverticula and sigmoid colon. 3-4 mm polyp ablated via cold biopsy from rectum. Small hemorrhoids below the dentate line.  Therapeutic/Diagnostic Maneuvers Performed:  See above  Complications:  none  Cecal Withdrawal Time:  11 minutes  Impression:  Examination performed to cecum. Few small diverticula at sigmoid colon. Small rectal polyp ablated via cold biopsy. External hemorrhoids.  Recommendations:  Standard instructions given. Mycolog-II cream. Apply to anal canal twice daily for 10 days and thereafter when necessary. I will contact patient with results of biopsy.  Pasty Manninen U  09/28/2011 12:01 PM  CC: Dr. Cassell Smiles.,  MD, MD & Dr. Bonnetta Barry ref. provider found

## 2011-09-30 ENCOUNTER — Encounter (HOSPITAL_COMMUNITY): Payer: Self-pay | Admitting: Internal Medicine

## 2011-10-10 ENCOUNTER — Encounter (INDEPENDENT_AMBULATORY_CARE_PROVIDER_SITE_OTHER): Payer: Self-pay | Admitting: *Deleted

## 2011-12-07 NOTE — Patient Instructions (Addendum)
Your procedure is scheduled on: 12/13/2011  Report to [facility] at  800      AM.  Call this number if you have problems the morning of surgery: [phone]   Do not eat food or drink liquids :After Midnight.      Take these medicines the morning of surgery with A SIP OF WATER:xanax,aricept,benicar,propranolol   Do not wear jewelry, make-up or nail polish.  Do not wear lotions, powders, or perfumes. You may wear deodorant.  Do not shave 48 hours prior to surgery.  Do not bring valuables to the hospital.  Contacts, dentures or bridgework may not be worn into surgery.  Leave suitcase in the car. After surgery it may be brought to your room.  For patients admitted to the hospital, checkout time is 11:00 AM the day of discharge.   Patients discharged the day of surgery will not be allowed to drive home.  :     Please read over the following fact sheets that you were given: Coughing and Deep Breathing, Surgical Site Infection Prevention, Anesthesia Post-op Instructions and Care and Recovery After Surgery    Cataract A cataract is a clouding of the lens of the eye. When a lens becomes cloudy, vision is reduced based on the degree and nature of the clouding. Many cataracts reduce vision to some degree. Some cataracts make people more near-sighted as they develop. Other cataracts increase glare. Cataracts that are ignored and become worse can sometimes look white. The white color can be seen through the pupil. CAUSES   Aging. However, cataracts may occur at any age, even in newborns.   Certain drugs.   Trauma to the eye.   Certain diseases such as diabetes.   Specific eye diseases such as chronic inflammation inside the eye or a sudden attack of a rare form of glaucoma.   Inherited or acquired medical problems.  SYMPTOMS   Gradual, progressive drop in vision in the affected eye.   Severe, rapid visual loss. This most often happens when trauma is the cause.  DIAGNOSIS  To detect a  cataract, an eye doctor examines the lens. Cataracts are best diagnosed with an exam of the eyes with the pupils enlarged (dilated) by drops.  TREATMENT  For an early cataract, vision may improve by using different eyeglasses or stronger lighting. If that does not help your vision, surgery is the only effective treatment. A cataract needs to be surgically removed when vision loss interferes with your everyday activities, such as driving, reading, or watching TV. A cataract may also have to be removed if it prevents examination or treatment of another eye problem. Surgery removes the cloudy lens and usually replaces it with a substitute lens (intraocular lens, IOL).  At a time when both you and your doctor agree, the cataract will be surgically removed. If you have cataracts in both eyes, only one is usually removed at a time. This allows the operated eye to heal and be out of danger from any possible problems after surgery (such as infection or poor wound healing). In rare cases, a cataract may be doing damage to your eye. In these cases, your caregiver may advise surgical removal right away. The vast majority of people who have cataract surgery have better vision afterward. HOME CARE INSTRUCTIONS  If you are not planning surgery, you may be asked to do the following:  Use different eyeglasses.   Use stronger or brighter lighting.   Ask your eye doctor about reducing your  medicine dose or changing medicines if it is thought that a medicine caused your cataract. Changing medicines does not make the cataract go away on its own.   Become familiar with your surroundings. Poor vision can lead to injury. Avoid bumping into things on the affected side. You are at a higher risk for tripping or falling.   Exercise extreme care when driving or operating machinery.   Wear sunglasses if you are sensitive to bright light or experiencing problems with glare.  SEEK IMMEDIATE MEDICAL CARE IF:   You have a  worsening or sudden vision loss.   You notice redness, swelling, or increasing pain in the eye.   You have a fever.  Document Released: 07/04/2005 Document Revised: 06/23/2011 Document Reviewed: 02/25/2011 [facility] Patient Information 2012 [place], [place].PATIENT INSTRUCTIONS POST-ANESTHESIA  IMMEDIATELY FOLLOWING SURGERY:  Do not drive or operate machinery for the first twenty four hours after surgery.  Do not make any important decisions for twenty four hours after surgery or while taking narcotic pain medications or sedatives.  If you develop intractable nausea and vomiting or a severe headache please notify your doctor immediately.  FOLLOW-UP:  Please make an appointment with your surgeon as instructed. You do not need to follow up with anesthesia unless specifically instructed to do so.  WOUND CARE INSTRUCTIONS (if applicable):  Keep a dry clean dressing on the anesthesia/puncture wound site if there is drainage.  Once the wound has quit draining you may leave it open to air.  Generally you should leave the bandage intact for twenty four hours unless there is drainage.  If the epidural site drains for more than 36-48 hours please call the anesthesia department.  QUESTIONS?:  Please feel free to call your physician or the hospital operator if you have any questions, and they will be happy to assist you.     [facility] Anesthesia Department [address] [place] [phone]

## 2011-12-08 ENCOUNTER — Encounter (HOSPITAL_COMMUNITY)
Admission: RE | Admit: 2011-12-08 | Discharge: 2011-12-08 | Disposition: A | Discharge status: Home / Self Care | Payer: Medicare Other | Source: Ambulatory Visit | Attending: Ophthalmology | Admitting: Ophthalmology

## 2011-12-08 ENCOUNTER — Other Ambulatory Visit: Payer: Self-pay

## 2011-12-08 ENCOUNTER — Encounter (HOSPITAL_COMMUNITY): Payer: Self-pay

## 2011-12-08 LAB — BASIC METABOLIC PANEL
CO2: 28 mEq/L (ref 19–32)
Chloride: 103 mEq/L (ref 96–112)
Glucose, Bld: 126 mg/dL — ABNORMAL HIGH (ref 70–99)
Sodium: 141 mEq/L (ref 135–145)

## 2011-12-08 LAB — HEMOGLOBIN AND HEMATOCRIT, BLOOD
HCT: 37.1 % (ref 36.0–46.0)
Hemoglobin: 12.7 g/dL (ref 12.0–15.0)

## 2011-12-13 ENCOUNTER — Ambulatory Visit (HOSPITAL_COMMUNITY)
Admission: RE | Admit: 2011-12-13 | Discharge: 2011-12-13 | Disposition: A | Discharge status: Home / Self Care | Payer: Medicare Other | Source: Ambulatory Visit | Attending: Ophthalmology | Admitting: Ophthalmology

## 2011-12-13 ENCOUNTER — Encounter (HOSPITAL_COMMUNITY): Payer: Self-pay | Admitting: *Deleted

## 2011-12-13 ENCOUNTER — Encounter (HOSPITAL_COMMUNITY): Payer: Self-pay | Admitting: Anesthesiology

## 2011-12-13 ENCOUNTER — Encounter (HOSPITAL_COMMUNITY)
Admission: RE | Disposition: A | Discharge status: Home / Self Care | Payer: Self-pay | Source: Ambulatory Visit | Attending: Ophthalmology

## 2011-12-13 ENCOUNTER — Ambulatory Visit (HOSPITAL_COMMUNITY): Payer: Medicare Other | Admitting: Anesthesiology

## 2011-12-13 DIAGNOSIS — I10 Essential (primary) hypertension: Secondary | ICD-10-CM | POA: Insufficient documentation

## 2011-12-13 DIAGNOSIS — Z01812 Encounter for preprocedural laboratory examination: Secondary | ICD-10-CM | POA: Insufficient documentation

## 2011-12-13 DIAGNOSIS — H2510 Age-related nuclear cataract, unspecified eye: Secondary | ICD-10-CM | POA: Insufficient documentation

## 2011-12-13 DIAGNOSIS — Z79899 Other long term (current) drug therapy: Secondary | ICD-10-CM | POA: Insufficient documentation

## 2011-12-13 DIAGNOSIS — E119 Type 2 diabetes mellitus without complications: Secondary | ICD-10-CM | POA: Insufficient documentation

## 2011-12-13 DIAGNOSIS — Z01810 Encounter for preprocedural cardiovascular examination: Secondary | ICD-10-CM | POA: Insufficient documentation

## 2011-12-13 DIAGNOSIS — G4733 Obstructive sleep apnea (adult) (pediatric): Secondary | ICD-10-CM | POA: Insufficient documentation

## 2011-12-13 HISTORY — PX: CATARACT EXTRACTION W/PHACO: SHX586

## 2011-12-13 SURGERY — PHACOEMULSIFICATION, CATARACT, WITH IOL INSERTION
Anesthesia: Monitor Anesthesia Care | Site: Eye | Laterality: Right | Wound class: Clean

## 2011-12-13 MED ORDER — LIDOCAINE HCL 3.5 % OP GEL: 1.0000 "application " | Freq: Once | OPHTHALMIC | Status: DC

## 2011-12-13 MED ORDER — BSS IO SOLN
INTRAOCULAR | Status: DC | PRN
  Administered 2011-12-13: 15 mL via INTRAOCULAR

## 2011-12-13 MED ORDER — PHENYLEPHRINE HCL 2.5 % OP SOLN
1.0000 [drp] | OPHTHALMIC | Status: AC
  Administered 2011-12-13 (×3): 1 [drp] via OPHTHALMIC

## 2011-12-13 MED ORDER — CYCLOPENTOLATE-PHENYLEPHRINE 0.2-1 % OP SOLN
1.0000 [drp] | OPHTHALMIC | Status: AC
  Administered 2011-12-13 (×3): 1 [drp] via OPHTHALMIC

## 2011-12-13 MED ORDER — PROVISC 10 MG/ML IO SOLN
INTRAOCULAR | Status: DC | PRN
  Administered 2011-12-13: 8.5 mg via INTRAOCULAR

## 2011-12-13 MED ORDER — MIDAZOLAM HCL 2 MG/2ML IJ SOLN
INTRAMUSCULAR | Status: AC
  Administered 2011-12-13: 2 mg via INTRAVENOUS
  Filled 2011-12-13: qty 2

## 2011-12-13 MED ORDER — FLURBIPROFEN SODIUM 0.03 % OP SOLN
OPHTHALMIC | Status: AC
  Administered 2011-12-13: 1 [drp] via OPHTHALMIC
  Filled 2011-12-13: qty 2.5

## 2011-12-13 MED ORDER — LACTATED RINGERS IV SOLN
INTRAVENOUS | Status: DC
  Administered 2011-12-13: 09:00:00 via INTRAVENOUS

## 2011-12-13 MED ORDER — CYCLOPENTOLATE-PHENYLEPHRINE 0.2-1 % OP SOLN
OPHTHALMIC | Status: AC
  Administered 2011-12-13: 1 [drp] via OPHTHALMIC
  Filled 2011-12-13: qty 2

## 2011-12-13 MED ORDER — TETRACAINE 0.5 % OP SOLN OPTIME - NO CHARGE
OPHTHALMIC | Status: DC | PRN
  Administered 2011-12-13: 2 [drp] via OPHTHALMIC

## 2011-12-13 MED ORDER — TETRACAINE HCL 0.5 % OP SOLN
1.0000 [drp] | OPHTHALMIC | Status: AC
  Administered 2011-12-13 (×3): 1 [drp] via OPHTHALMIC

## 2011-12-13 MED ORDER — FLURBIPROFEN SODIUM 0.03 % OP SOLN
1.0000 [drp] | OPHTHALMIC | Status: DC | PRN
Start: 2011-12-13 — End: 2011-12-13
  Administered 2011-12-13 (×3): 1 [drp] via OPHTHALMIC

## 2011-12-13 MED ORDER — MIDAZOLAM HCL 2 MG/2ML IJ SOLN
1.0000 mg | INTRAMUSCULAR | Status: DC | PRN
  Administered 2011-12-13: 2 mg via INTRAVENOUS

## 2011-12-13 MED ORDER — PHENYLEPHRINE HCL 2.5 % OP SOLN
OPHTHALMIC | Status: AC
  Administered 2011-12-13: 1 [drp] via OPHTHALMIC
  Filled 2011-12-13: qty 2

## 2011-12-13 MED ORDER — EPINEPHRINE HCL 1 MG/ML IJ SOLN
INTRAOCULAR | Status: DC | PRN
  Administered 2011-12-13: 09:00:00

## 2011-12-13 MED ORDER — TETRACAINE HCL 0.5 % OP SOLN
OPHTHALMIC | Status: AC
  Administered 2011-12-13: 1 [drp] via OPHTHALMIC
  Filled 2011-12-13: qty 2

## 2011-12-13 SURGICAL SUPPLY — 23 items
CAPSULAR TENSION RING-AMO (OPHTHALMIC RELATED) IMPLANT
CLOTH BEACON ORANGE TIMEOUT ST (SAFETY) ×1 IMPLANT
EYE SHIELD UNIVERSAL CLEAR (GAUZE/BANDAGES/DRESSINGS) ×2 IMPLANT
GLOVE BIO SURGEON STRL SZ 6.5 (GLOVE) ×1 IMPLANT
GLOVE ECLIPSE 6.5 STRL STRAW (GLOVE) ×1 IMPLANT
GLOVE ECLIPSE 7.0 STRL STRAW (GLOVE) IMPLANT
GLOVE EXAM NITRILE LRG STRL (GLOVE) IMPLANT
GLOVE EXAM NITRILE MD LF STRL (GLOVE) ×1 IMPLANT
GLOVE SKINSENSE NS SZ6.5 (GLOVE)
GLOVE SKINSENSE STRL SZ6.5 (GLOVE) IMPLANT
HEALON 5 0.6 ML (INTRAOCULAR LENS) IMPLANT
KIT VITRECTOMY (OPHTHALMIC RELATED) IMPLANT
PAD ARMBOARD 7.5X6 YLW CONV (MISCELLANEOUS) ×1 IMPLANT
PROC W NO LENS (INTRAOCULAR LENS)
PROC W SPEC LENS (INTRAOCULAR LENS)
PROCESS W NO LENS (INTRAOCULAR LENS) IMPLANT
PROCESS W SPEC LENS (INTRAOCULAR LENS) IMPLANT
RING MALYGIN (MISCELLANEOUS) IMPLANT
SIGHTPATH CAT PROC W REG LENS (Ophthalmic Related) ×2 IMPLANT
TAPE SURG TRANSPORE 1 IN (GAUZE/BANDAGES/DRESSINGS) IMPLANT
TAPE SURGICAL TRANSPORE 1 IN (GAUZE/BANDAGES/DRESSINGS) ×1
VISCOELASTIC ADDITIONAL (OPHTHALMIC RELATED) IMPLANT
WATER STERILE IRR 250ML POUR (IV SOLUTION) ×1 IMPLANT

## 2011-12-13 NOTE — H&P (Signed)
The patient was re examined and there is no change in the patients condition since the original H and P. 

## 2011-12-13 NOTE — Anesthesia Postprocedure Evaluation (Signed)
  Anesthesia Post-op Note  Patient: [name]  Procedure(s) Performed: Procedure(s) (LRB): CATARACT EXTRACTION PHACO AND INTRAOCULAR LENS PLACEMENT (IOC) (Right)  Patient Location: PACU and Short Stay  Anesthesia Type: MAC  Level of Consciousness: awake, alert  and oriented  Airway and Oxygen Therapy: Patient Spontanous Breathing  Post-op Pain: none  Post-op Assessment: Post-op Vital signs reviewed, Patient's Cardiovascular Status Stable, Respiratory Function Stable, Patent Airway and No signs of Nausea or vomiting  Post-op Vital Signs: Reviewed and stable  Complications: No apparent anesthesia complications

## 2011-12-13 NOTE — Anesthesia Preprocedure Evaluation (Signed)
Anesthesia Evaluation  Patient identified by MRN, date of birth, ID band Patient awake    Reviewed: Allergy & Precautions, H&P , NPO status , Patient's Chart, lab work & pertinent test results  Airway Mallampati: II      Dental  (+) Edentulous Upper and Edentulous Lower   Pulmonary sleep apnea ,  breath sounds clear to auscultation        Cardiovascular hypertension, Pt. on medications Rhythm:Regular     Neuro/Psych    GI/Hepatic   Endo/Other  Diabetes mellitus-, Well Controlled, Type 2  Renal/GU      Musculoskeletal   Abdominal   Peds  Hematology   Anesthesia Other Findings   Reproductive/Obstetrics                           Anesthesia Physical Anesthesia Plan  ASA: III  Anesthesia Plan: MAC   Post-op Pain Management:    Induction: Intravenous  Airway Management Planned: Nasal Cannula  Additional Equipment:   Intra-op Plan:   Post-operative Plan:   Informed Consent: I have reviewed the patients History and Physical, chart, labs and discussed the procedure including the risks, benefits and alternatives for the proposed anesthesia with the patient or authorized representative who has indicated his/her understanding and acceptance.     Plan Discussed with:   Anesthesia Plan Comments:         Anesthesia Quick Evaluation  

## 2011-12-13 NOTE — Transfer of Care (Signed)
Immediate Anesthesia Transfer of Care Note  Patient: [name]  Procedure(s) Performed: Procedure(s) (LRB): CATARACT EXTRACTION PHACO AND INTRAOCULAR LENS PLACEMENT (IOC) (Right)  Patient Location: PACU and Short Stay  Anesthesia Type: MAC  Level of Consciousness: awake, alert  and oriented  Airway & Oxygen Therapy: Patient Spontanous Breathing  Post-op Assessment: Report given to PACU RN  Post vital signs: Reviewed and stable  Complications: No apparent anesthesia complications

## 2011-12-13 NOTE — Discharge Instructions (Signed)
RAYNAH GOMES  12/13/2011           [facility] Instructions [address] [place] [address]      1. Avoid closing eyes tightly. One often closes the eye tightly when laughing, talking, sneezing, coughing or if they feel irritated. At these times, you should be careful not to close your eyes tightly.  2. Instill eye drops as instructed. To instill drops in your eye, open it, look up and have someone gently pull the lower lid down and instill a couple of drops inside the lower lid.  3. Do not touch upper lid.  4. Take Advil or Tylenol for pain.  5. You may use either eye for near work, such as reading or sewing and you may watch television.  6. You may have your hair done at the beauty parlor at any time.  7. Wear dark glasses with or without your own glasses if you are in bright light.  8. Call our office at [phone] or [phone] if you have sharp pain in your eye or unusual symptoms.  9. Do not be concerned because vision in the operative eye is not good. It will not be good, no matter how successful the operation, until you get a special lens for it. Your old glasses will not be suited to the new eye that was operated on and you will not be ready for a new lens for about a month.  10. Follow up at the [facility] office today @ 2:30-3:00pm.    I have received a copy of the above instructions and will follow them.

## 2011-12-13 NOTE — Op Note (Signed)
Patient brought to the operating room and prepped and draped in the usual manner.  Lid speculum inserted in right eye.  Stab incision made at the twelve o'clock position.  Provisc instilled in the anterior chamber.   A 2.4 mm. Stab incision was made temporally.  An anterior capsulotomy was done with a bent 25 gauge needle.  The nucleus was hydrodissected.  The Phaco tip was inserted in the anterior chamber and the nucleus was emulsified.  CDE was 12.67.  The cortical material was then removed with the I and A tip.  Posterior capsule was the polished.  The anterior chamber was deepened with Provisc.  A 22.5 Diopter Rayner 570C IOL was then inserted in the capsular bag.  Provisc was then removed with the I and A tip.  The wound was then hydrated.  Patient sent to the Recovery Room in good condition with follow up in my office.

## 2011-12-15 ENCOUNTER — Encounter (HOSPITAL_COMMUNITY): Payer: Self-pay | Admitting: Ophthalmology

## 2012-02-16 ENCOUNTER — Encounter (HOSPITAL_COMMUNITY): Payer: Self-pay

## 2012-02-21 ENCOUNTER — Encounter (HOSPITAL_COMMUNITY): Payer: Self-pay

## 2012-02-21 ENCOUNTER — Encounter (HOSPITAL_COMMUNITY): Admission: RE | Admit: 2012-02-21 | Discharge: 2012-02-21 | Payer: Medicare Other | Source: Ambulatory Visit

## 2012-02-21 MED ORDER — ONDANSETRON HCL 4 MG/2ML IJ SOLN: 4.0000 mg | Freq: Once | INTRAMUSCULAR | Status: DC | PRN

## 2012-02-21 MED ORDER — FENTANYL CITRATE 0.05 MG/ML IJ SOLN: 25.0000 ug | INTRAMUSCULAR | Status: DC | PRN

## 2012-02-21 NOTE — Pre-Procedure Instructions (Signed)
Dr Jayme Cloud aware of labs from 12/08/2011. Does not want labs repeated.

## 2012-02-27 MED ORDER — LIDOCAINE HCL 3.5 % OP GEL: 1.0000 "application " | Freq: Once | OPHTHALMIC | Status: DC

## 2012-02-27 MED ORDER — FLURBIPROFEN SODIUM 0.03 % OP SOLN
OPHTHALMIC | Status: AC
  Filled 2012-02-27: qty 2.5

## 2012-02-27 MED ORDER — CYCLOPENTOLATE HCL 1 % OP SOLN
OPHTHALMIC | Status: AC
  Filled 2012-02-27: qty 2

## 2012-02-27 MED ORDER — PHENYLEPHRINE HCL 2.5 % OP SOLN
OPHTHALMIC | Status: AC
  Filled 2012-02-27: qty 2

## 2012-02-27 MED ORDER — TETRACAINE HCL 0.5 % OP SOLN
OPHTHALMIC | Status: AC
  Filled 2012-02-27: qty 2

## 2012-02-28 ENCOUNTER — Encounter (HOSPITAL_COMMUNITY)
Admission: RE | Disposition: A | Discharge status: Home / Self Care | Payer: Self-pay | Source: Ambulatory Visit | Attending: Ophthalmology

## 2012-02-28 ENCOUNTER — Encounter (HOSPITAL_COMMUNITY): Payer: Self-pay | Admitting: Anesthesiology

## 2012-02-28 ENCOUNTER — Encounter (HOSPITAL_COMMUNITY): Payer: Self-pay | Admitting: Pharmacy Technician

## 2012-02-28 ENCOUNTER — Encounter (HOSPITAL_COMMUNITY): Payer: Self-pay | Admitting: *Deleted

## 2012-02-28 ENCOUNTER — Ambulatory Visit (HOSPITAL_COMMUNITY)
Admission: RE | Admit: 2012-02-28 | Discharge: 2012-02-28 | Disposition: A | Discharge status: Home / Self Care | Payer: Medicare Other | Source: Ambulatory Visit | Attending: Ophthalmology | Admitting: Ophthalmology

## 2012-02-28 DIAGNOSIS — I10 Essential (primary) hypertension: Secondary | ICD-10-CM | POA: Insufficient documentation

## 2012-02-28 DIAGNOSIS — H2510 Age-related nuclear cataract, unspecified eye: Secondary | ICD-10-CM | POA: Insufficient documentation

## 2012-02-28 DIAGNOSIS — E119 Type 2 diabetes mellitus without complications: Secondary | ICD-10-CM | POA: Insufficient documentation

## 2012-02-28 DIAGNOSIS — Z01812 Encounter for preprocedural laboratory examination: Secondary | ICD-10-CM | POA: Insufficient documentation

## 2012-02-28 LAB — GLUCOSE, CAPILLARY: Glucose-Capillary: 106 mg/dL — ABNORMAL HIGH (ref 70–99)

## 2012-02-28 SURGERY — PHACOEMULSIFICATION, CATARACT, WITH IOL INSERTION
Anesthesia: Monitor Anesthesia Care

## 2012-02-28 MED ORDER — MIDAZOLAM HCL 2 MG/2ML IJ SOLN: 1.0000 mg | INTRAMUSCULAR | Status: DC | PRN

## 2012-02-28 MED ORDER — CYCLOPENTOLATE HCL 1 % OP SOLN
1.0000 [drp] | OPHTHALMIC | Status: AC
  Administered 2012-02-28 (×3): 1 [drp] via OPHTHALMIC

## 2012-02-28 MED ORDER — LACTATED RINGERS IV SOLN
INTRAVENOUS | Status: DC
Start: 2012-02-28 — End: 2012-02-28
  Administered 2012-02-28: 1000 mL via INTRAVENOUS

## 2012-02-28 MED ORDER — TETRACAINE HCL 0.5 % OP SOLN
1.0000 [drp] | OPHTHALMIC | Status: AC
  Administered 2012-02-28 (×3): 1 [drp] via OPHTHALMIC

## 2012-02-28 MED ORDER — MIDAZOLAM HCL 2 MG/2ML IJ SOLN
INTRAMUSCULAR | Status: AC
  Filled 2012-02-28: qty 2

## 2012-02-28 MED ORDER — EPINEPHRINE HCL 1 MG/ML IJ SOLN
INTRAMUSCULAR | Status: AC
  Filled 2012-02-28: qty 1

## 2012-02-28 MED ORDER — FLURBIPROFEN SODIUM 0.03 % OP SOLN
1.0000 [drp] | OPHTHALMIC | Status: AC
  Administered 2012-02-28 (×3): 1 [drp] via OPHTHALMIC

## 2012-02-28 MED ORDER — PHENYLEPHRINE HCL 2.5 % OP SOLN
1.0000 [drp] | OPHTHALMIC | Status: AC
  Administered 2012-02-28 (×3): 1 [drp] via OPHTHALMIC

## 2012-02-28 SURGICAL SUPPLY — 20 items
CAPSULAR TENSION RING-AMO (OPHTHALMIC RELATED) IMPLANT
CLOTH BEACON ORANGE TIMEOUT ST (SAFETY) IMPLANT
EYE SHIELD UNIVERSAL CLEAR (GAUZE/BANDAGES/DRESSINGS) IMPLANT
GLOVE BIO SURGEON STRL SZ 6.5 (GLOVE) IMPLANT
GLOVE ECLIPSE 6.5 STRL STRAW (GLOVE) IMPLANT
GLOVE ECLIPSE 7.0 STRL STRAW (GLOVE) IMPLANT
GLOVE EXAM NITRILE LRG STRL (GLOVE) IMPLANT
GLOVE EXAM NITRILE MD LF STRL (GLOVE) IMPLANT
GLOVE SKINSENSE NS SZ6.5 (GLOVE)
GLOVE SKINSENSE STRL SZ6.5 (GLOVE) IMPLANT
HEALON 5 0.6 ML (INTRAOCULAR LENS) IMPLANT
KIT VITRECTOMY (OPHTHALMIC RELATED) IMPLANT
PAD ARMBOARD 7.5X6 YLW CONV (MISCELLANEOUS) IMPLANT
PROC W NO LENS (INTRAOCULAR LENS)
PROC W SPEC LENS (INTRAOCULAR LENS)
PROCESS W NO LENS (INTRAOCULAR LENS) IMPLANT
PROCESS W SPEC LENS (INTRAOCULAR LENS) IMPLANT
RING MALYGIN (MISCELLANEOUS) IMPLANT
VISCOELASTIC ADDITIONAL (OPHTHALMIC RELATED) IMPLANT
WATER STERILE IRR 250ML POUR (IV SOLUTION) IMPLANT

## 2012-02-28 NOTE — H&P (Signed)
The patient was re examined and there is no change in the patients condition since the original H and P. 

## 2012-02-28 NOTE — Anesthesia Preprocedure Evaluation (Deleted)
Anesthesia Evaluation  Patient identified by MRN, date of birth, ID band Patient awake    Reviewed: Allergy & Precautions, H&P , NPO status , Patient's Chart, lab work & pertinent test results  Airway Mallampati: II      Dental  (+) Edentulous Upper and Edentulous Lower   Pulmonary sleep apnea ,  breath sounds clear to auscultation        Cardiovascular hypertension, Pt. on medications Rhythm:Regular     Neuro/Psych    GI/Hepatic   Endo/Other  Diabetes mellitus-, Well Controlled, Type 2  Renal/GU      Musculoskeletal   Abdominal   Peds  Hematology   Anesthesia Other Findings   Reproductive/Obstetrics                           Anesthesia Physical Anesthesia Plan  ASA: III  Anesthesia Plan: MAC   Post-op Pain Management:    Induction: Intravenous  Airway Management Planned: Nasal Cannula  Additional Equipment:   Intra-op Plan:   Post-operative Plan:   Informed Consent: I have reviewed the patients History and Physical, chart, labs and discussed the procedure including the risks, benefits and alternatives for the proposed anesthesia with the patient or authorized representative who has indicated his/her understanding and acceptance.     Plan Discussed with:   Anesthesia Plan Comments:         Anesthesia Quick Evaluation  

## 2012-02-28 NOTE — Brief Op Note (Signed)
Surgery canceled due to surgical malfunction with the surgical microscope   Dr Nile Riggs in to discuss this with pt   Pt to be rescheduled next Tuesday here at [facility]

## 2012-02-29 MED ORDER — ONDANSETRON HCL 4 MG/2ML IJ SOLN: 4.0000 mg | Freq: Once | INTRAMUSCULAR | Status: AC | PRN

## 2012-02-29 MED ORDER — FENTANYL CITRATE 0.05 MG/ML IJ SOLN: 25.0000 ug | INTRAMUSCULAR | Status: DC | PRN

## 2012-03-01 ENCOUNTER — Encounter (HOSPITAL_COMMUNITY)
Admission: RE | Admit: 2012-03-01 | Discharge: 2012-03-01 | Payer: Medicare Other | Source: Ambulatory Visit | Attending: Ophthalmology | Admitting: Ophthalmology

## 2012-03-06 ENCOUNTER — Ambulatory Visit (HOSPITAL_COMMUNITY)
Admission: RE | Admit: 2012-03-06 | Discharge: 2012-03-06 | Disposition: A | Discharge status: Home / Self Care | Payer: Medicare Other | Source: Ambulatory Visit | Attending: Ophthalmology | Admitting: Ophthalmology

## 2012-03-06 ENCOUNTER — Encounter (HOSPITAL_COMMUNITY): Payer: Self-pay | Admitting: Anesthesiology

## 2012-03-06 ENCOUNTER — Ambulatory Visit (HOSPITAL_COMMUNITY): Payer: Medicare Other | Admitting: Anesthesiology

## 2012-03-06 ENCOUNTER — Encounter (HOSPITAL_COMMUNITY): Payer: Self-pay | Admitting: *Deleted

## 2012-03-06 ENCOUNTER — Encounter (HOSPITAL_COMMUNITY)
Admission: RE | Disposition: A | Discharge status: Home / Self Care | Payer: Self-pay | Source: Ambulatory Visit | Attending: Ophthalmology

## 2012-03-06 DIAGNOSIS — I10 Essential (primary) hypertension: Secondary | ICD-10-CM | POA: Insufficient documentation

## 2012-03-06 DIAGNOSIS — Z01812 Encounter for preprocedural laboratory examination: Secondary | ICD-10-CM | POA: Insufficient documentation

## 2012-03-06 DIAGNOSIS — H2510 Age-related nuclear cataract, unspecified eye: Secondary | ICD-10-CM | POA: Insufficient documentation

## 2012-03-06 DIAGNOSIS — E119 Type 2 diabetes mellitus without complications: Secondary | ICD-10-CM | POA: Insufficient documentation

## 2012-03-06 DIAGNOSIS — Z79899 Other long term (current) drug therapy: Secondary | ICD-10-CM | POA: Insufficient documentation

## 2012-03-06 HISTORY — PX: CATARACT EXTRACTION W/PHACO: SHX586

## 2012-03-06 SURGERY — PHACOEMULSIFICATION, CATARACT, WITH IOL INSERTION
Anesthesia: Monitor Anesthesia Care | Site: Eye | Laterality: Left | Wound class: Clean

## 2012-03-06 MED ORDER — EPINEPHRINE HCL 1 MG/ML IJ SOLN
INTRAMUSCULAR | Status: AC
  Filled 2012-03-06: qty 1

## 2012-03-06 MED ORDER — FENTANYL CITRATE 0.05 MG/ML IJ SOLN: 25.0000 ug | INTRAMUSCULAR | Status: DC | PRN

## 2012-03-06 MED ORDER — PHENYLEPHRINE HCL 2.5 % OP SOLN
1.0000 [drp] | OPHTHALMIC | Status: AC
  Administered 2012-03-06 (×3): 1 [drp] via OPHTHALMIC

## 2012-03-06 MED ORDER — MIDAZOLAM HCL 2 MG/2ML IJ SOLN
1.0000 mg | INTRAMUSCULAR | Status: DC | PRN
  Administered 2012-03-06: 2 mg via INTRAVENOUS

## 2012-03-06 MED ORDER — FLURBIPROFEN SODIUM 0.03 % OP SOLN
1.0000 [drp] | OPHTHALMIC | Status: AC
  Administered 2012-03-06 (×3): 1 [drp] via OPHTHALMIC

## 2012-03-06 MED ORDER — BSS IO SOLN
INTRAOCULAR | Status: DC | PRN
  Administered 2012-03-06: 15 mL via INTRAOCULAR

## 2012-03-06 MED ORDER — MIDAZOLAM HCL 2 MG/2ML IJ SOLN
INTRAMUSCULAR | Status: AC
  Filled 2012-03-06: qty 2

## 2012-03-06 MED ORDER — CYCLOPENTOLATE HCL 1 % OP SOLN
1.0000 [drp] | OPHTHALMIC | Status: AC
  Administered 2012-03-06 (×3): 1 [drp] via OPHTHALMIC

## 2012-03-06 MED ORDER — ONDANSETRON HCL 4 MG/2ML IJ SOLN: 4.0000 mg | Freq: Once | INTRAMUSCULAR | Status: AC | PRN

## 2012-03-06 MED ORDER — EPINEPHRINE HCL 1 MG/ML IJ SOLN
INTRAOCULAR | Status: DC | PRN
  Administered 2012-03-06: 08:00:00

## 2012-03-06 MED ORDER — PROVISC 10 MG/ML IO SOLN
INTRAOCULAR | Status: DC | PRN
  Administered 2012-03-06: 8.5 mg via INTRAOCULAR

## 2012-03-06 MED ORDER — TETRACAINE HCL 0.5 % OP SOLN
1.0000 [drp] | OPHTHALMIC | Status: AC
  Administered 2012-03-06 (×3): 1 [drp] via OPHTHALMIC

## 2012-03-06 MED ORDER — LACTATED RINGERS IV SOLN
INTRAVENOUS | Status: DC
  Administered 2012-03-06: 08:00:00 via INTRAVENOUS

## 2012-03-06 SURGICAL SUPPLY — 23 items
CAPSULAR TENSION RING-AMO (OPHTHALMIC RELATED) IMPLANT
CLOTH BEACON ORANGE TIMEOUT ST (SAFETY) ×1 IMPLANT
EYE SHIELD UNIVERSAL CLEAR (GAUZE/BANDAGES/DRESSINGS) ×2 IMPLANT
GLOVE BIO SURGEON STRL SZ 6.5 (GLOVE) ×1 IMPLANT
GLOVE ECLIPSE 6.5 STRL STRAW (GLOVE) IMPLANT
GLOVE ECLIPSE 7.0 STRL STRAW (GLOVE) IMPLANT
GLOVE EXAM NITRILE LRG STRL (GLOVE) IMPLANT
GLOVE EXAM NITRILE MD LF STRL (GLOVE) ×1 IMPLANT
GLOVE SKINSENSE NS SZ6.5 (GLOVE)
GLOVE SKINSENSE STRL SZ6.5 (GLOVE) IMPLANT
HEALON 5 0.6 ML (INTRAOCULAR LENS) IMPLANT
KIT VITRECTOMY (OPHTHALMIC RELATED) IMPLANT
PAD ARMBOARD 7.5X6 YLW CONV (MISCELLANEOUS) ×1 IMPLANT
PROC W NO LENS (INTRAOCULAR LENS)
PROC W SPEC LENS (INTRAOCULAR LENS)
PROCESS W NO LENS (INTRAOCULAR LENS) IMPLANT
PROCESS W SPEC LENS (INTRAOCULAR LENS) IMPLANT
RING MALYGIN (MISCELLANEOUS) IMPLANT
SIGHTPATH CAT PROC W REG LENS (Ophthalmic Related) ×2 IMPLANT
TAPE SURG TRANSPORE 1 IN (GAUZE/BANDAGES/DRESSINGS) IMPLANT
TAPE SURGICAL TRANSPORE 1 IN (GAUZE/BANDAGES/DRESSINGS) ×1
VISCOELASTIC ADDITIONAL (OPHTHALMIC RELATED) IMPLANT
WATER STERILE IRR 250ML POUR (IV SOLUTION) ×1 IMPLANT

## 2012-03-06 NOTE — Anesthesia Procedure Notes (Signed)
Procedure Name: MAC Date/Time: 03/06/2012 8:05 AM Performed by: Franco Nones Pre-anesthesia Checklist: Patient identified, Emergency Drugs available, Suction available, Timeout performed and Patient being monitored Patient Re-evaluated:Patient Re-evaluated prior to inductionOxygen Delivery Method: Nasal Cannula

## 2012-03-06 NOTE — Anesthesia Postprocedure Evaluation (Signed)
  Anesthesia Post-op Note  Patient: [name]  Procedure(s) Performed: Procedure(s) (LRB): CATARACT EXTRACTION PHACO AND INTRAOCULAR LENS PLACEMENT (IOC) (Left)  Patient Location:  Short Stay  Anesthesia Type: MAC  Level of Consciousness: awake  Airway and Oxygen Therapy: Patient Spontanous Breathing  Post-op Pain: none  Post-op Assessment: Post-op Vital signs reviewed, Patient's Cardiovascular Status Stable, Respiratory Function Stable, Patent Airway, No signs of Nausea or vomiting and Pain level controlled  Post-op Vital Signs: Reviewed and stable  Complications: No apparent anesthesia complications

## 2012-03-06 NOTE — Op Note (Signed)
Patient brought to the operating room and prepped and draped in the usual manner.  Lid speculum inserted in left eye.  Stab incision made at the twelve o'clock position.  Provisc instilled in the anterior chamber.   A 2.4 mm. Stab incision was made temporally.  An anterior capsulotomy was done with a bent 25 gauge needle.  The nucleus was hydrodissected.  The Phaco tip was inserted in the anterior chamber and the nucleus was emulsified.  CDE was 13.12.  The cortical material was then removed with the I and A tip.  Posterior capsule was the polished.  The anterior chamber was deepened with Provisc.  A 24.0 Diopter Rayner 570C IOL was then inserted in the capsular bag.  Provisc was then removed with the I and A tip.  The wound was then hydrated.  Patient sent to the Recovery Room in good condition with follow up in my office.

## 2012-03-06 NOTE — H&P (Signed)
The patient was re examined and there is no change in the patients condition since the original H and P. 

## 2012-03-06 NOTE — Transfer of Care (Signed)
Immediate Anesthesia Transfer of Care Note  Patient: [name]  Procedure(s) Performed: Procedure(s) (LRB): CATARACT EXTRACTION PHACO AND INTRAOCULAR LENS PLACEMENT (IOC) (Left)  Patient Location: Shortstay  Anesthesia Type: MAC  Level of Consciousness: awake  Airway & Oxygen Therapy: Patient Spontanous Breathing   Post-op Assessment: Report given to PACU RN, Post -op Vital signs reviewed and stable and Patient moving all extremities  Post vital signs: Reviewed and stable  Complications: No apparent anesthesia complications

## 2012-03-06 NOTE — Anesthesia Preprocedure Evaluation (Signed)
Anesthesia Evaluation  Patient identified by MRN, date of birth, ID band Patient awake    Reviewed: Allergy & Precautions, H&P , NPO status , Patient's Chart, lab work & pertinent test results  Airway Mallampati: II      Dental  (+) Edentulous Upper and Edentulous Lower   Pulmonary sleep apnea ,  breath sounds clear to auscultation        Cardiovascular hypertension, Pt. on medications Rhythm:Regular     Neuro/Psych    GI/Hepatic   Endo/Other  Diabetes mellitus-, Well Controlled, Type 2  Renal/GU      Musculoskeletal   Abdominal   Peds  Hematology   Anesthesia Other Findings   Reproductive/Obstetrics                           Anesthesia Physical Anesthesia Plan  ASA: III  Anesthesia Plan: MAC   Post-op Pain Management:    Induction: Intravenous  Airway Management Planned: Nasal Cannula  Additional Equipment:   Intra-op Plan:   Post-operative Plan:   Informed Consent: I have reviewed the patients History and Physical, chart, labs and discussed the procedure including the risks, benefits and alternatives for the proposed anesthesia with the patient or authorized representative who has indicated his/her understanding and acceptance.     Plan Discussed with:   Anesthesia Plan Comments:         Anesthesia Quick Evaluation

## 2012-03-09 ENCOUNTER — Encounter (HOSPITAL_COMMUNITY): Payer: Self-pay | Admitting: Ophthalmology

## 2012-05-24 ENCOUNTER — Encounter (HOSPITAL_COMMUNITY): Payer: Self-pay | Admitting: *Deleted

## 2012-05-24 ENCOUNTER — Observation Stay (HOSPITAL_COMMUNITY)
Admission: EM | Admit: 2012-05-24 | Discharge: 2012-05-25 | Disposition: A | Discharge status: Home / Self Care | Payer: Medicare Other | Attending: Internal Medicine | Admitting: Internal Medicine

## 2012-05-24 ENCOUNTER — Emergency Department (HOSPITAL_COMMUNITY): Payer: Medicare Other

## 2012-05-24 DIAGNOSIS — G4730 Sleep apnea, unspecified: Secondary | ICD-10-CM | POA: Insufficient documentation

## 2012-05-24 DIAGNOSIS — R079 Chest pain, unspecified: Principal | ICD-10-CM | POA: Insufficient documentation

## 2012-05-24 DIAGNOSIS — E7800 Pure hypercholesterolemia, unspecified: Secondary | ICD-10-CM | POA: Insufficient documentation

## 2012-05-24 DIAGNOSIS — E119 Type 2 diabetes mellitus without complications: Secondary | ICD-10-CM | POA: Insufficient documentation

## 2012-05-24 DIAGNOSIS — I10 Essential (primary) hypertension: Secondary | ICD-10-CM | POA: Insufficient documentation

## 2012-05-24 LAB — CBC WITH DIFFERENTIAL/PLATELET
Eosinophils Absolute: 0 10*3/uL (ref 0.0–0.7)
Hemoglobin: 13.1 g/dL (ref 12.0–15.0)
Lymphocytes Relative: 27 % (ref 12–46)
Lymphs Abs: 2.1 10*3/uL (ref 0.7–4.0)
MCH: 30.6 pg (ref 26.0–34.0)
Monocytes Relative: 7 % (ref 3–12)
Neutro Abs: 5.1 10*3/uL (ref 1.7–7.7)
Neutrophils Relative %: 66 % (ref 43–77)
Platelets: 175 10*3/uL (ref 150–400)
RBC: 4.28 MIL/uL (ref 3.87–5.11)
WBC: 7.7 10*3/uL (ref 4.0–10.5)

## 2012-05-24 LAB — BASIC METABOLIC PANEL
BUN: 19 mg/dL (ref 6–23)
Chloride: 102 mEq/L (ref 96–112)
GFR calc non Af Amer: 46 mL/min — ABNORMAL LOW (ref >90)
Glucose, Bld: 93 mg/dL (ref 70–99)
Potassium: 3.2 mEq/L — ABNORMAL LOW (ref 3.5–5.1)
Sodium: 141 mEq/L (ref 135–145)

## 2012-05-24 LAB — TROPONIN I
Troponin I: <0.3 ng/mL (ref <0.30)
Troponin I: <0.3 ng/mL (ref <0.30)

## 2012-05-24 LAB — GLUCOSE, CAPILLARY

## 2012-05-24 MED ORDER — INSULIN ASPART 100 UNIT/ML ~~LOC~~ SOLN: 0.0000 [IU] | Freq: Three times a day (TID) | SUBCUTANEOUS | Status: DC

## 2012-05-24 MED ORDER — HYDROCHLOROTHIAZIDE 25 MG PO TABS
25.0000 mg | ORAL_TABLET | Freq: Every day | ORAL | Status: DC
  Administered 2012-05-24 – 2012-05-25 (×2): 25 mg via ORAL
  Filled 2012-05-24 (×2): qty 1

## 2012-05-24 MED ORDER — ALPRAZOLAM 0.5 MG PO TABS
0.5000 mg | ORAL_TABLET | Freq: Two times a day (BID) | ORAL | Status: DC | PRN
  Administered 2012-05-24: 0.5 mg via ORAL
  Filled 2012-05-24: qty 1

## 2012-05-24 MED ORDER — MEMANTINE HCL 10 MG PO TABS
10.0000 mg | ORAL_TABLET | Freq: Two times a day (BID) | ORAL | Status: DC
  Administered 2012-05-24 – 2012-05-25 (×2): 10 mg via ORAL
  Filled 2012-05-24 (×2): qty 1

## 2012-05-24 MED ORDER — SIMVASTATIN 20 MG PO TABS
20.0000 mg | ORAL_TABLET | Freq: Every evening | ORAL | Status: DC
  Administered 2012-05-24: 20 mg via ORAL
  Filled 2012-05-24: qty 1

## 2012-05-24 MED ORDER — OLMESARTAN MEDOXOMIL-HCTZ 40-25 MG PO TABS: 1.0000 | ORAL_TABLET | Freq: Every day | ORAL | Status: DC

## 2012-05-24 MED ORDER — ZOLPIDEM TARTRATE 5 MG PO TABS: 10.0000 mg | ORAL_TABLET | Freq: Every evening | ORAL | Status: DC | PRN

## 2012-05-24 MED ORDER — ASPIRIN EC 81 MG PO TBEC
81.0000 mg | DELAYED_RELEASE_TABLET | Freq: Every day | ORAL | Status: DC
  Administered 2012-05-24 – 2012-05-25 (×2): 81 mg via ORAL
  Filled 2012-05-24 (×2): qty 1

## 2012-05-24 MED ORDER — ONDANSETRON HCL 4 MG PO TABS: 4.0000 mg | ORAL_TABLET | Freq: Four times a day (QID) | ORAL | Status: DC | PRN

## 2012-05-24 MED ORDER — ZOLPIDEM TARTRATE 5 MG PO TABS: 5.0000 mg | ORAL_TABLET | Freq: Every evening | ORAL | Status: DC | PRN

## 2012-05-24 MED ORDER — HEPARIN SODIUM (PORCINE) 5000 UNIT/ML IJ SOLN
5000.0000 [IU] | Freq: Three times a day (TID) | INTRAMUSCULAR | Status: DC
  Administered 2012-05-24 – 2012-05-25 (×2): 5000 [IU] via SUBCUTANEOUS
  Filled 2012-05-24 (×2): qty 1

## 2012-05-24 MED ORDER — SODIUM CHLORIDE 0.9 % IJ SOLN
3.0000 mL | Freq: Two times a day (BID) | INTRAMUSCULAR | Status: DC
  Administered 2012-05-24 – 2012-05-25 (×2): 3 mL via INTRAVENOUS
  Filled 2012-05-24 (×2): qty 3

## 2012-05-24 MED ORDER — OLMESARTAN MEDOXOMIL 20 MG PO TABS
40.0000 mg | ORAL_TABLET | Freq: Every day | ORAL | Status: DC
  Administered 2012-05-24 – 2012-05-25 (×2): 40 mg via ORAL
  Filled 2012-05-24 (×5): qty 2

## 2012-05-24 MED ORDER — INSULIN ASPART 100 UNIT/ML ~~LOC~~ SOLN: 0.0000 [IU] | Freq: Every day | SUBCUTANEOUS | Status: DC

## 2012-05-24 MED ORDER — ONDANSETRON HCL 4 MG/2ML IJ SOLN: 4.0000 mg | Freq: Four times a day (QID) | INTRAMUSCULAR | Status: DC | PRN

## 2012-05-24 MED ORDER — DONEPEZIL HCL 5 MG PO TABS
10.0000 mg | ORAL_TABLET | Freq: Every day | ORAL | Status: DC
  Administered 2012-05-24: 10 mg via ORAL
  Filled 2012-05-24: qty 2

## 2012-05-24 NOTE — H&P (Signed)
[facility] History and Physical  SAGEN VOILS ZOX:[id number] DOB: 1929/10/15 DOA: 05/24/2012  PCP: Cassell Smiles., MD    Chief Complaint: Chest tightness  HPI: [name] is a 78 y.o. female presents with the above symptoms which actually been present for approximately year on and off. Typically she gets chest tightness late at night or early in the mornings and they may last evening hours so. However, the chest tightness is are not associated with dyspnea, sweating or nausea. She does not have any palpitations. She went to see her primary care physician this morning and mentioned that this history, whereupon he sent her to the emergency room. She is diabetic, hypertensive. She is a nonsmoker. She is now being admitted for further investigation of this chest tightness.   Review of Systems:  Apart from history of present illness, other systems negative.   Past Medical History  Diagnosis Date  . Hypertension   . High cholesterol   . Diabetes mellitus   . Sleep apnea    Past Surgical History  Procedure Date  . Cholecystectomy   . Colonoscopy 09/28/2011    Procedure: COLONOSCOPY;  Surgeon: Malissa Hippo, MD;  Location: [facility];  Service: Endoscopy;  Laterality: N/A;  1200  . Cataract extraction w/phaco 12/13/2011    Procedure: CATARACT EXTRACTION PHACO AND INTRAOCULAR LENS PLACEMENT (IOC);  Surgeon: Loraine Leriche T. Nile Riggs, MD;  Location: [facility] ORS;  Service: Ophthalmology;  Laterality: Right;  CDE 12.67  . Cataract extraction w/phaco 03/06/2012    Procedure: CATARACT EXTRACTION PHACO AND INTRAOCULAR LENS PLACEMENT (IOC);  Surgeon: Loraine Leriche T. Nile Riggs, MD;  Location: [facility] ORS;  Service: Ophthalmology;  Laterality: Left;  CDE:13.12   Social History:   she is married and lives with her husband. She does not smoke cigarettes. She does not think alcohol. She is fairly independent.    No Known Allergies  History reviewed. No pertinent family history. Not relevant.    Prior to  Admission medications   Medication Sig Start Date End Date Taking? Authorizing Provider  ALPRAZolam Prudy Feeler) 0.5 MG tablet Take 0.5 mg by mouth 2 (two) times daily as needed. For anxiety   Yes Historical Provider, MD  aspirin EC 81 MG tablet Take 81 mg by mouth daily.   Yes Historical Provider, MD  donepezil (ARICEPT) 10 MG tablet Take 10 mg by mouth at bedtime.   Yes Historical Provider, MD  memantine (NAMENDA) 10 MG tablet Take 10 mg by mouth 2 (two) times daily.   Yes Historical Provider, MD  olmesartan-hydrochlorothiazide (BENICAR HCT) 40-25 MG per tablet Take 1 tablet by mouth daily.    Yes Historical Provider, MD  simvastatin (ZOCOR) 20 MG tablet Take 20 mg by mouth every evening.   Yes Historical Provider, MD  zolpidem (AMBIEN) 10 MG tablet Take 10 mg by mouth at bedtime as needed.   Yes Historical Provider, MD   Physical Exam: Filed Vitals:   05/24/12 1206 05/24/12 1357 05/24/12 1436  BP: 154/65 127/61 133/52  Pulse: 81 72 68  Temp: 98.8 F (37.1 C)    TempSrc: Oral    Resp: 18 18 16   Weight: 75.297 kg (166 lb)    SpO2: 97% 97% 98%     General:   she looks systemically well and not obviously in pain.   Eyes:  jaundice. No pallor.   ENT: Within normal limits.   Neck: No lymphadenopathy. No thyromegaly.  Cardiovascular: Heart sounds are present and normal without gallop rhythm. No murmurs.  Respiratory:  lung fields are clear.   Abdomen: Soft, nontender, no hepatosplenomegaly.  Skin:  no rashes.   Musculoskeletal: No joint abnormalities except for age-related osteoarthritis.   Psychiatric:  appropriate affect.   Neurologic: Alert and orientated without any focal neurological signs.   Labs on Admission:  Basic Metabolic Panel:  Lab 05/24/12 1610  NA 141  K 3.2*  CL 102  CO2 26  GLUCOSE 93  BUN 19  CREATININE 1.11*  CALCIUM 9.7  MG --  PHOS --      CBC:  Lab 05/24/12 1310  WBC 7.7  NEUTROABS 5.1  HGB 13.1  HCT 38.7  MCV 90.4  PLT 175    Cardiac Enzymes:  Lab 05/24/12 1310  CKTOTAL --  CKMB --  CKMBINDEX --  TROPONINI <0.30         Radiological Exams on Admission: Dg Chest 2 View  05/24/2012  *RADIOLOGY REPORT*  Clinical Data: Chest pain  CHEST - 2 VIEW  Comparison: 12/11/10  Findings:  Cardiomediastinal silhouette is stable.  Mild hyperinflation again noted.  No acute infiltrate or pleural effusion.  No pulmonary edema.  Stable osteopenia and degenerative changes thoracic spine.  IMPRESSION: No active disease.  Hyperinflation again noted.   Original Report Authenticated By: Natasha Mead, M.D.     EKG: Independently reviewed. Normal sinus rhythm, no ST T-wave changes  acute. Nonspecific T-wave changes.   Assessment/Plan   1.  Chest tightness/pain of unclear etiology. 2. Hypertension. 3. Type 2 diabetes mellitus.   Plan: 1. Admit to telemetry. 2. Serial cardiac enzymes. 3. Cardiology consultation. She may need outpatient versus inpatient stress test.   Code Status:  full code.    Family Communication:  discuss plan with patient at the bedside.   Disposition Plan:  home when medically stable.    Time spent: 30 minutes.   Wilson Singer [facility] Pager [phone].   If 7PM-7AM, please contact night-coverage [email or web address] Password [email or web address] 05/24/2012, 2:51 PM

## 2012-05-24 NOTE — ED Provider Notes (Signed)
History   This chart was scribed for Virginia Octave, MD by [name] . The patient was seen in room APA04/APA04. Patient's care was started at 1231.   CSN: [id number]  Arrival date & time 05/24/12  1155   First MD Initiated Contact with Patient 05/24/12 1231      Chief Complaint  Patient presents with  . Chest Pain    The history is provided by the patient. No language interpreter was used.  [name] is a 78 y.o. female who presents to the Emergency Department complaining of intermittent, chest pain described as tightness for several "years". She states it last a few minutes at a time and occurs with waking up. She states she just informed her PCP of the chest pain and was sent to ED for further evaluation. She denies any SOB, diaphoresis, nausea, emesis, abdominal pain or changes in appetite. She denies any h/o cardiac stents, MI, or stress test. She has a h/o HTN, DM and high cholesterol. She is a former smoker.   PCP: Dr. Sherwood Gambler  Past Medical History  Diagnosis Date  . Hypertension   . High cholesterol   . Diabetes mellitus   . Sleep apnea     Past Surgical History  Procedure Date  . Cholecystectomy   . Colonoscopy 09/28/2011    Procedure: COLONOSCOPY;  Surgeon: Malissa Hippo, MD;  Location: [facility];  Service: Endoscopy;  Laterality: N/A;  1200  . Cataract extraction w/phaco 12/13/2011    Procedure: CATARACT EXTRACTION PHACO AND INTRAOCULAR LENS PLACEMENT (IOC);  Surgeon: Loraine Leriche T. Nile Riggs, MD;  Location: [facility];  Service: Ophthalmology;  Laterality: Right;  CDE 12.67  . Cataract extraction w/phaco 03/06/2012    Procedure: CATARACT EXTRACTION PHACO AND INTRAOCULAR LENS PLACEMENT (IOC);  Surgeon: Loraine Leriche T. Nile Riggs, MD;  Location: [facility];  Service: Ophthalmology;  Laterality: Left;  CDE:13.12    History reviewed. No pertinent family history.  History  Substance Use Topics  . Smoking status: Former Smoker -- 0.2 packs/day for 10 years    Quit date:  07/18/2007  . Smokeless tobacco: Not on file  . Alcohol Use: No    OB History    Grav Para Term Preterm Abortions TAB SAB Ect Mult Living                  Review of Systems A complete 10 system review of systems was obtained and all systems are negative except as noted in the HPI and PMH.   Allergies  Review of patient's allergies indicates no known allergies.  Home Medications   Current Outpatient Rx  Name  Route  Sig  Dispense  Refill  . ALPRAZOLAM 0.5 MG PO TABS   Oral   Take 0.5 mg by mouth 2 (two) times daily as needed. For anxiety         . ASPIRIN EC 81 MG PO TBEC   Oral   Take 81 mg by mouth daily.         . DONEPEZIL HCL 10 MG PO TABS   Oral   Take 10 mg by mouth at bedtime.         Marland Kitchen METHIMAZOLE 5 MG PO TABS   Oral   Take 5 mg by mouth every morning.         Marland Kitchen OLMESARTAN MEDOXOMIL-HCTZ 40-25 MG PO TABS   Oral   Take 1 tablet by mouth daily.          Marland Kitchen SIMVASTATIN  20 MG PO TABS   Oral   Take 20 mg by mouth every evening.         Marland Kitchen ZOLPIDEM TARTRATE 5 MG PO TABS   Oral   Take 5 mg by mouth at bedtime. For sleep           BP 154/65  Pulse 81  Temp 98.8 F (37.1 C) (Oral)  Resp 18  Wt 166 lb (75.297 kg)  SpO2 97%  Physical Exam  Nursing note and vitals reviewed. Constitutional: She is oriented to person, place, and time. She appears well-developed and well-nourished. No distress.  HENT:  Head: Normocephalic and atraumatic.  Right Ear: External ear normal.  Left Ear: External ear normal.  Nose: Nose normal.  Mouth/Throat: Oropharynx is clear and moist. No oropharyngeal exudate.  Eyes: Conjunctivae normal and EOM are normal. Pupils are equal, round, and reactive to light.  Neck: Normal range of motion. Neck supple. No tracheal deviation present.  Cardiovascular: Normal rate, regular rhythm and normal heart sounds.   No murmur heard. Pulmonary/Chest: Effort normal and breath sounds normal. No respiratory distress. She has no  wheezes. She has no rales.  Abdominal: Soft. Bowel sounds are normal. She exhibits no distension. There is no tenderness.  Musculoskeletal: Normal range of motion. She exhibits no edema.       No calf asymmetry or tenderness.   Neurological: She is alert and oriented to person, place, and time. No cranial nerve deficit.  Skin: Skin is warm and dry.  Psychiatric: She has a normal mood and affect. Her behavior is normal.    ED Course  Procedures (including critical care time)  DIAGNOSTIC STUDIES: Oxygen Saturation is 97% on room air, adequate by my interpretation.    COORDINATION OF CARE:  12:40-Discussed planned course of treatment with the patient including a chest x-ray and blood work, who is agreeable at this time. Will consult with Dr. Sherwood Gambler.    Labs Reviewed  BASIC METABOLIC PANEL - Abnormal; Notable for the following:    Potassium 3.2 (*)     Creatinine, Ser 1.11 (*)     GFR calc non Af Amer 46 (*)     GFR calc Af Amer 53 (*)     All other components within normal limits  CBC WITH DIFFERENTIAL  TROPONIN I   Dg Chest 2 View  05/24/2012  *RADIOLOGY REPORT*  Clinical Data: Chest pain  CHEST - 2 VIEW  Comparison: 12/11/10  Findings:  Cardiomediastinal silhouette is stable.  Mild hyperinflation again noted.  No acute infiltrate or pleural effusion.  No pulmonary edema.  Stable osteopenia and degenerative changes thoracic spine.  IMPRESSION: No active disease.  Hyperinflation again noted.   Original Report Authenticated By: Natasha Mead, M.D.      No diagnosis found.    MDM  History limited by poor historian. Patient reports chest tightness in the substernal with shortness of breath that she's had for years. Denies any cardiac history is never had a stress test. Sent by her doctor today.  Troponin negative.  Given risk factors, age, EKG changes, will admit for rule out.   Date: 05/24/2012  Rate: 79  Rhythm: normal sinus rhythm and premature ventricular contractions (PVC)   QRS Axis: normal  Intervals: normal  ST/T Wave abnormalities: normal  Conduction Disutrbances:none  Narrative Interpretation: Inferior lateral T wave inversions that progressed  Old EKG Reviewed: changes noted   I personally performed the services described in this documentation, which was scribed in my presence.  The recorded information has been reviewed and considered.       Virginia Octave, MD 05/24/12 [phone]

## 2012-05-24 NOTE — ED Notes (Signed)
Chest "tightness: for a long time",with sob,  Sent here by her md.

## 2012-05-25 ENCOUNTER — Encounter (HOSPITAL_COMMUNITY): Payer: Self-pay | Admitting: Internal Medicine

## 2012-05-25 DIAGNOSIS — I379 Nonrheumatic pulmonary valve disorder, unspecified: Secondary | ICD-10-CM

## 2012-05-25 LAB — CBC
HCT: 32.9 % — ABNORMAL LOW (ref 36.0–46.0)
MCHC: 34 g/dL (ref 30.0–36.0)
MCV: 88.9 fL (ref 78.0–100.0)
RDW: 12.4 % (ref 11.5–15.5)

## 2012-05-25 LAB — COMPREHENSIVE METABOLIC PANEL
ALT: 8 U/L (ref 0–35)
Alkaline Phosphatase: 61 U/L (ref 39–117)
Chloride: 102 mEq/L (ref 96–112)
GFR calc Af Amer: 53 mL/min — ABNORMAL LOW (ref >90)
Glucose, Bld: 101 mg/dL — ABNORMAL HIGH (ref 70–99)
Potassium: 3.5 mEq/L (ref 3.5–5.1)
Sodium: 138 mEq/L (ref 135–145)
Total Protein: 6.5 g/dL (ref 6.0–8.3)

## 2012-05-25 LAB — GLUCOSE, CAPILLARY

## 2012-05-25 MED ORDER — POTASSIUM CHLORIDE CRYS ER 20 MEQ PO TBCR
20.0000 meq | EXTENDED_RELEASE_TABLET | Freq: Once | ORAL | Status: AC
  Administered 2012-05-25: 20 meq via ORAL
  Filled 2012-05-25: qty 1

## 2012-05-25 MED ORDER — HYDROCHLOROTHIAZIDE 12.5 MG PO CAPS: 12.5000 mg | ORAL_CAPSULE | Freq: Every day | ORAL | Status: DC

## 2012-05-25 NOTE — Discharge Summary (Signed)
Physician Discharge Summary  Patient ID: [name] MRN: [id number] DOB/AGE: [birth year] 76 y.o.  Admit date: 05/24/2012 Discharge date: 05/25/2012  Discharge Diagnoses:  Principal Problem:  *Chest pain Active Problems:  Hypertension  High cholesterol    Medication List     As of 05/25/2012  1:02 PM    TAKE these medications         ALPRAZolam 0.5 MG tablet   Commonly known as: XANAX   Take 0.5 mg by mouth 2 (two) times daily as needed. For anxiety      aspirin EC 81 MG tablet   Take 81 mg by mouth daily.      donepezil 10 MG tablet   Commonly known as: ARICEPT   Take 10 mg by mouth at bedtime.      memantine 10 MG tablet   Commonly known as: NAMENDA   Take 10 mg by mouth 2 (two) times daily.      olmesartan-hydrochlorothiazide 40-25 MG per tablet   Commonly known as: BENICAR HCT   Take 1 tablet by mouth daily.      simvastatin 20 MG tablet   Commonly known as: ZOCOR   Take 20 mg by mouth every evening.      zolpidem 10 MG tablet   Commonly known as: AMBIEN   Take 10 mg by mouth at bedtime as needed.            Discharge Orders    Future Orders Please Complete By Expires   Diet - low sodium heart healthy      Activity as tolerated - No restrictions         Follow-up Information    Follow up with Nona Dell, MD. In 2 weeks.   Contact information:   [address] [place] [address] [phone]         Disposition: 01-Home or Self Care  Discharged Condition:   Consults: Treatment Team:  Kathlen Brunswick, MD  Labs:   Results for orders placed during the hospital encounter of 05/24/12 (from the past 48 hour(s))  CBC WITH DIFFERENTIAL     Status: Normal   Collection Time   05/24/12  1:10 PM      Component Value Range Comment   WBC 7.7  4.0 - 10.5 K/uL    RBC 4.28  3.87 - 5.11 MIL/uL    Hemoglobin 13.1  12.0 - 15.0 g/dL    HCT 41.3  24.4 - 01.0 %    MCV 90.4  78.0 - 100.0 fL    MCH 30.6  26.0 - 34.0 pg    MCHC 33.9   30.0 - 36.0 g/dL    RDW 27.2  53.6 - 64.4 %    Platelets 175  150 - 400 K/uL    Neutrophils Relative 66  43 - 77 %    Neutro Abs 5.1  1.7 - 7.7 K/uL    Lymphocytes Relative 27  12 - 46 %    Lymphs Abs 2.1  0.7 - 4.0 K/uL    Monocytes Relative 7  3 - 12 %    Monocytes Absolute 0.5  0.1 - 1.0 K/uL    Eosinophils Relative 0  0 - 5 %    Eosinophils Absolute 0.0  0.0 - 0.7 K/uL    Basophils Relative 0  0 - 1 %    Basophils Absolute 0.0  0.0 - 0.1 K/uL   BASIC METABOLIC PANEL     Status: Abnormal   Collection  Time   05/24/12  1:10 PM      Component Value Range Comment   Sodium 141  135 - 145 mEq/L    Potassium 3.2 (*) 3.5 - 5.1 mEq/L    Chloride 102  96 - 112 mEq/L    CO2 26  19 - 32 mEq/L    Glucose, Bld 93  70 - 99 mg/dL    BUN 19  6 - 23 mg/dL    Creatinine, Ser 9.81 (*) 0.50 - 1.10 mg/dL    Calcium 9.7  8.4 - 19.1 mg/dL    GFR calc non Af Amer 46 (*) >90 mL/min    GFR calc Af Amer 53 (*) >90 mL/min   TROPONIN I     Status: Normal   Collection Time   05/24/12  1:10 PM      Component Value Range Comment   Troponin I <0.30  <0.30 ng/mL   TROPONIN I     Status: Normal   Collection Time   05/24/12  3:36 PM      Component Value Range Comment   Troponin I <0.30  <0.30 ng/mL   TSH     Status: Normal   Collection Time   05/24/12  3:36 PM      Component Value Range Comment   TSH 0.369  0.350 - 4.500 uIU/mL   GLUCOSE, CAPILLARY     Status: Abnormal   Collection Time   05/24/12  5:05 PM      Component Value Range Comment   Glucose-Capillary 68 (*) 70 - 99 mg/dL    Comment 1 Documented in Chart      Comment 2 Notify RN     TROPONIN I     Status: Normal   Collection Time   05/24/12  8:42 PM      Component Value Range Comment   Troponin I <0.30  <0.30 ng/mL   GLUCOSE, CAPILLARY     Status: Abnormal   Collection Time   05/24/12  8:46 PM      Component Value Range Comment   Glucose-Capillary 120 (*) 70 - 99 mg/dL   TROPONIN I     Status: Normal   Collection Time   05/25/12  2:53 AM        Component Value Range Comment   Troponin I <0.30  <0.30 ng/mL   COMPREHENSIVE METABOLIC PANEL     Status: Abnormal   Collection Time   05/25/12  2:55 AM      Component Value Range Comment   Sodium 138  135 - 145 mEq/L    Potassium 3.5  3.5 - 5.1 mEq/L    Chloride 102  96 - 112 mEq/L    CO2 26  19 - 32 mEq/L    Glucose, Bld 101 (*) 70 - 99 mg/dL    BUN 20  6 - 23 mg/dL    Creatinine, Ser 4.78  0.50 - 1.10 mg/dL    Calcium 9.4  8.4 - 29.5 mg/dL    Total Protein 6.5  6.0 - 8.3 g/dL    Albumin 3.3 (*) 3.5 - 5.2 g/dL    AST 18  0 - 37 U/L    ALT 8  0 - 35 U/L    Alkaline Phosphatase 61  39 - 117 U/L    Total Bilirubin 0.4  0.3 - 1.2 mg/dL    GFR calc non Af Amer 46 (*) >90 mL/min    GFR calc Af Amer 53 (*) >90 mL/min  CBC     Status: Abnormal   Collection Time   05/25/12  2:55 AM      Component Value Range Comment   WBC 8.1  4.0 - 10.5 K/uL    RBC 3.70 (*) 3.87 - 5.11 MIL/uL    Hemoglobin 11.2 (*) 12.0 - 15.0 g/dL    HCT 96.0 (*) 45.4 - 46.0 %    MCV 88.9  78.0 - 100.0 fL    MCH 30.3  26.0 - 34.0 pg    MCHC 34.0  30.0 - 36.0 g/dL    RDW 09.8  11.9 - 14.7 %    Platelets 168  150 - 400 K/uL   GLUCOSE, CAPILLARY     Status: Abnormal   Collection Time   05/25/12  7:34 AM      Component Value Range Comment   Glucose-Capillary 107 (*) 70 - 99 mg/dL    Comment 1 Notify RN      Comment 2 Documented in Chart     GLUCOSE, CAPILLARY     Status: Normal   Collection Time   05/25/12 11:27 AM      Component Value Range Comment   Glucose-Capillary 98  70 - 99 mg/dL    Comment 1 Notify RN      Comment 2 Documented in Chart      Diagnostics:  Dg Chest 2 View  05/24/2012  *RADIOLOGY REPORT*  Clinical Data: Chest pain  CHEST - 2 VIEW  Comparison: 12/11/10  Findings:  Cardiomediastinal silhouette is stable.  Mild hyperinflation again noted.  No acute infiltrate or pleural effusion.  No pulmonary edema.  Stable osteopenia and degenerative changes thoracic spine.  IMPRESSION: No active  disease.  Hyperinflation again noted.   Original Report Authenticated By: Natasha Mead, M.D.    Echocardiogram: Left ventricle: The cavity size was normal. There was mild focal basal hypertrophy of the septum. Systolic function was vigorous. The estimated ejection fraction was in the range of 70% to 75%. Wall motion was normal; there were no regional wall motion abnormalities. Doppler parameters are consistent with abnormal left ventricular relaxation (grade 1 diastolic dysfunction). - Aortic valve: Mildly calcified annulus. Trileaflet. Trivial regurgitation. - Mitral valve: Calcified annulus. Mild regurgitation. - Left atrium: The atrium was mildly dilated. - Tricuspid valve: Trivial regurgitation. - Pulmonic valve: Mild regurgitation. - Pulmonary arteries: PA peak pressure: 30mm Hg (S). - Pericardium, extracardiac: There was no pericardial effusion.  EKG:  Sinus rhythm with frequent Premature ventricular complexes Otherwise normal ECG  Full Code   Hospital Course: See H&P for complete admission details. The patient is an 78 year old black female who presents with vague chest pain. She is a difficult historian. She was admitted to telemetry overnight. Ruled out for MI. Cardiology was consulted and recommended a 2-D echocardiogram. It showed no wall motion abnormalities. Ejection fraction normal. Cardiology will evaluate the patient as an outpatient to decide whether outpatient stress testing is warranted. She has had no further chest pain. Her family members wonder whether anxiety may have caused her symptoms. She is been cleared by cardiology for discharge.  Discharge Exam:  Blood pressure 126/54, pulse 74, temperature 97.9 F (36.6 C), temperature source Oral, resp. rate 18, height 5\' 2"  (1.575 m), weight 75.297 kg (166 lb), SpO2 96.00%.  General: Comfortable. Cooperative. Forgetful. Lungs clear to auscultation bilaterally without wheeze rhonchi or rales Cardiovascular regular rate  rhythm without murmurs gallops rubs Abdomen soft nontender nondistended Extremities No clubbing, cyanosis or edema  Signed: Giovannie Scerbo L 05/25/2012,  1:02 PM

## 2012-05-25 NOTE — Progress Notes (Signed)
Inpatient Diabetes Program Recommendations  AACE/ADA: New Consensus Statement on Inpatient Glycemic Control (2013)  Target Ranges:  Prepandial:   less than 140 mg/dL      Peak postprandial:   less than 180 mg/dL (1-2 hours)      Critically ill patients:  140 - 180 mg/dL    Inpatient Diabetes Program Recommendations HgbA1C: Please consider ordering an A1C since patient has a history of DM but not on any medication regimen at home.  Note: Will continue to follow. Thanks, Orlando Penner, RN, BSN, CCRN Diabetes Coordinator Inpatient Diabetes Program [phone]

## 2012-05-25 NOTE — Consult Note (Signed)
CARDIOLOGY CONSULT NOTE  Patient ID: [name] MRN: [id number] DOB/AGE: [birth year] 78 y.o.  Admit date: 05/24/2012 Referring Physician: [facility] Primary PhysicianFUSCO,LAWRENCE J., MD Consulting Cardiologist: Dr. Diona Clayton Reason for Consultation:Recurrent chest pian with CVRF Principal Problem:  *Chest pain Active Problems:  Hypertension  Diabetes mellitus  HPI: [name] is an 78 year old patient who has had no prior cardiac history, who presented to the ER after being seen by her primary care physician Dr. Sherwood Clayton. She had been complaining of intermittent chest discomfort in his office. The patient describes the pain in vague terms, "a funny feeling in my chest, uncomfortable, it just does not feel right." This has been intermittent for the last several months without associated shortness of breath dizziness nausea vomiting or diaphoresis. She has noted no change in her energy level. EKG was completed and Dr. Sharyon Clayton office and found to be abnormal with T-wave inversions noted.     The patient has a history of hypertension, hypercholesterolemia, and diabetes. On arrival to the emergency the patient's blood pressure was 154/65 heart rate 81 respirations 18 she was afebrile. Troponin was found to be negative, but potassium was low at 3.2 creatinine 1.11. GFR 53. She was not found to be anemic, neg, UTI, with chest x-ray revealing no active disease, with hyperinflation noted. She is on, Benicar HCTZ  40./25 mg daily at home. There is no potassium replacement.      She is now without discomfort, but states since eating breakfast she feels a fullness in her chest.  She is otherwise stable.       Review of systems complete and found to be negative unless listed above   Past Medical History  Diagnosis Date  . Hypertension   . High cholesterol   . Diabetes mellitus   . Sleep apnea     History reviewed. No pertinent family history.  History   Social History  . Marital Status:  Married    Spouse Name: N/A    Number of Children: N/A  . Years of Education: N/A   Occupational History  . Not on file.   Social History Main Topics  . Smoking status: Former Smoker -- 0.2 packs/day for 10 years    Quit date: 07/18/2007  . Smokeless tobacco: Not on file  . Alcohol Use: No  . Drug Use: No  . Sexually Active: Not on file   Other Topics Concern  . Not on file   Social History Narrative  . No narrative on file    Past Surgical History  Procedure Date  . Cholecystectomy   . Colonoscopy 09/28/2011    Procedure: COLONOSCOPY;  Surgeon: Virginia Hippo, MD;  Location: [facility];  Service: Endoscopy;  Laterality: N/A;  1200  . Cataract extraction w/phaco 12/13/2011    Procedure: CATARACT EXTRACTION PHACO AND INTRAOCULAR LENS PLACEMENT (IOC);  Surgeon: Virginia Leriche T. Nile Riggs, MD;  Location: [facility];  Service: Ophthalmology;  Laterality: Right;  CDE 12.67  . Cataract extraction w/phaco 03/06/2012    Procedure: CATARACT EXTRACTION PHACO AND INTRAOCULAR LENS PLACEMENT (IOC);  Surgeon: Virginia Leriche T. Nile Riggs, MD;  Location: [facility];  Service: Ophthalmology;  Laterality: Left;  CDE:13.12     Prescriptions prior to admission  Medication Sig Dispense Refill  . ALPRAZolam (XANAX) 0.5 MG tablet Take 0.5 mg by mouth 2 (two) times daily as needed. For anxiety      . aspirin EC 81 MG tablet Take 81 mg by mouth daily.      Marland Kitchen donepezil (  ARICEPT) 10 MG tablet Take 10 mg by mouth at bedtime.      . memantine (NAMENDA) 10 MG tablet Take 10 mg by mouth 2 (two) times daily.      Marland Kitchen olmesartan-hydrochlorothiazide (BENICAR HCT) 40-25 MG per tablet Take 1 tablet by mouth daily.       . simvastatin (ZOCOR) 20 MG tablet Take 20 mg by mouth every evening.      . zolpidem (AMBIEN) 10 MG tablet Take 10 mg by mouth at bedtime as needed.        Physical Exam: Blood pressure 96/56, pulse 68, temperature 97.9 F (36.6 C), temperature source Oral, resp. rate 16, height 5\' 2"  (1.575 m), weight 166 lb (75.297  kg), SpO2 98.00%.   General: Well developed, well nourished, in no acute distress, some memory deficiency. Head: Eyes PERRLA, No xanthomas.   Normal cephalic and atramatic  Lungs: Clear bilaterally to auscultation and percussion. Heart: HRRR with occasional irregular extra systole. S1 S2, without MRG.  Pulses are 2+ & equal.            No carotid bruit. No JVD.  No abdominal bruits. No femoral bruits. Abdomen: Bowel sounds are positive, abdomen soft and non-tender without masses or                  Hernia's noted. Msk:  Kyphosis normal, normal gait. Normal strength and tone for age. Extremities: No clubbing, cyanosis or edema.  DP +1 Neuro: Alert and oriented X 3. Psych:  Good affect, responds appropriately   Labs:   Lab Results  Component Value Date   WBC 8.1 05/25/2012   HGB 11.2* 05/25/2012   HCT 32.9* 05/25/2012   MCV 88.9 05/25/2012   PLT 168 05/25/2012    Lab 05/25/12 0255  NA 138  K 3.5  CL 102  CO2 26  BUN 20  CREATININE 1.10  CALCIUM 9.4  PROT 6.5  BILITOT 0.4  ALKPHOS 61  ALT 8  AST 18  GLUCOSE 101*   Lab Results  Component Value Date   CKTOTAL 120 12/12/2010   CKMB 2.9 12/12/2010   TROPONINI <0.30 05/25/2012        Radiology: Dg Chest 2 View  05/24/2012  *RADIOLOGY REPORT*  Clinical Data: Chest pain  CHEST - 2 VIEW  Comparison: 12/11/10  Findings:  Cardiomediastinal silhouette is stable.  Mild hyperinflation again noted.  No acute infiltrate or pleural effusion.  No pulmonary edema.  Stable osteopenia and degenerative changes thoracic spine.  IMPRESSION: No active disease.  Hyperinflation again noted.   Original Report Authenticated By: [name], M.D.    EKG:NSR with PVC's, T-wave inversion in the anterior leads, with nonspecific changes laterally. Improved from initial EKG on admission with significant T-wave inversion noted.  ASSESSMENT AND PLAN:   1. Atypical Chest Pain: The patient presents with vague symptoms of chest discomfort, but no radiation, it  does not occur with or without exertion, without associated shortness of breath dizziness or diaphoresis. It is noted that she was mildly hypokalemic on admission. She has had some improvement of this since her stay with increased potassium from 3.2-3.5. We will give her one dose of by mouth potassium today. Echocardiogram has been ordered. Troponins are negative. If echo is without abnormalities and her discomfort improves she should be able to go home with a followup appointment with cardiology as an outpatient. Can further discuss need to have a stress test completed at that time.  2. Hypertension: She has long-standing  history of hypertension. She is on an ARB with HCTZ at home. HCTZ could be contributing to her hypokalemia. Creatinine is 1.11. Would consider adding potassium replacement.  3. Diabetes: Followed by PCP. Consider Hgb A1C if appropriate.  4. Hypercholesterolemia: Currently on a statin.  She is followed by PCP for ongoing assessment.  5. Mild Dementia: She has some short term memory deficits with the need to repeat explanation of tests to her during initial interview. She is on Aricept. Watch HR on this as can contribute to bradycardia.  Signed: Bettey Mare. Lyman Bishop NP [facility] 05/25/2012, 8:40 AM Co-Sign MD  Attending note:  Patient seen and examined. Reviewed records. She reports a vague history of recurrent chest discomfort. She describes a sharp sensation on "both sides" of her chest, occurs sporadically without known precipitant. She states that she takes "a pill" at home and it makes her symptoms better eventually. Her medication list is reviewed above, includes Xanax, no analgesics, otherwise antihypertensives and a statin. She states that she feels better today.  ECGs reviewed showing sinus rhythm with occasional PVCs, also nonspecific ST-T wave changes, most notable in the anterolateral leads recently. Cardiac markers are reassuring with completely normal  troponin I levels. Chest x-ray shows hyperinflation with no acute findings.  On examination she is comfortable this morning, afebrile, blood pressure 128/54, heart rate 74 and sinus rhythm. Lungs are clear. Cardiac exam with regular rate and rhythm, occasional ectopic beat, soft systolic murmur without rub  Echocardiogram has been ordered to assess LV function and exclude wall motion abnormalities. Her chest pain is atypical with nonspecific abnormal ECG and associated cardiac risk factors. If LV function is normal, she can likely be considered for outpatient stress testing, specifically a Lexiscan Myoview, and an office followup for review.  Jonelle Sidle, M.D., F.A.C.C.

## 2012-05-25 NOTE — Progress Notes (Signed)
*  PRELIMINARY RESULTS* Echocardiogram 2D Echocardiogram has been performed.  Conrad Shamrock 05/25/2012, 10:40 AM

## 2012-05-25 NOTE — Progress Notes (Signed)
05/25/12 1401 Patient being discharged home today. Reviewed discharge instructions with patient, grand-daughter at bedside. Given copy of instructions, medication list (marked with which medications she needs this evening and which she has already taken today), and f/u appointment set up with cardiology. Both verbalized understanding. IV site d/c'd and within normal limits. Denies pain at this time. Pt in stable condition awaiting w/c for discharge. Earnstine Regal, RN

## 2012-05-25 NOTE — Progress Notes (Signed)
UR Chart Review Completed  

## 2012-06-08 ENCOUNTER — Encounter: Payer: Medicare Other | Admitting: Adult Health

## 2013-02-26 ENCOUNTER — Other Ambulatory Visit: Payer: Self-pay | Admitting: Neurology

## 2013-02-26 ENCOUNTER — Ambulatory Visit (HOSPITAL_COMMUNITY)
Admission: RE | Admit: 2013-02-26 | Discharge: 2013-02-26 | Disposition: A | Discharge status: Home / Self Care | Payer: Medicare Other | Source: Ambulatory Visit | Attending: Neurology | Admitting: Neurology

## 2013-02-26 DIAGNOSIS — M79609 Pain in unspecified limb: Secondary | ICD-10-CM

## 2013-02-26 DIAGNOSIS — M899 Disorder of bone, unspecified: Secondary | ICD-10-CM | POA: Insufficient documentation

## 2013-02-26 DIAGNOSIS — R296 Repeated falls: Secondary | ICD-10-CM | POA: Insufficient documentation

## 2013-10-07 ENCOUNTER — Emergency Department (HOSPITAL_COMMUNITY)
Admission: EM | Admit: 2013-10-07 | Discharge: 2013-10-07 | Disposition: A | Discharge status: Home / Self Care | Payer: Medicare Other | Attending: Emergency Medicine | Admitting: Emergency Medicine

## 2013-10-07 ENCOUNTER — Encounter (HOSPITAL_COMMUNITY): Payer: Self-pay | Admitting: Emergency Medicine

## 2013-10-07 ENCOUNTER — Emergency Department (HOSPITAL_COMMUNITY): Payer: Medicare Other

## 2013-10-07 DIAGNOSIS — F0390 Unspecified dementia without behavioral disturbance: Secondary | ICD-10-CM | POA: Insufficient documentation

## 2013-10-07 DIAGNOSIS — R443 Hallucinations, unspecified: Secondary | ICD-10-CM | POA: Insufficient documentation

## 2013-10-07 DIAGNOSIS — I10 Essential (primary) hypertension: Secondary | ICD-10-CM | POA: Insufficient documentation

## 2013-10-07 DIAGNOSIS — Z7982 Long term (current) use of aspirin: Secondary | ICD-10-CM | POA: Insufficient documentation

## 2013-10-07 DIAGNOSIS — Z79899 Other long term (current) drug therapy: Secondary | ICD-10-CM | POA: Insufficient documentation

## 2013-10-07 DIAGNOSIS — I2510 Atherosclerotic heart disease of native coronary artery without angina pectoris: Secondary | ICD-10-CM | POA: Insufficient documentation

## 2013-10-07 DIAGNOSIS — E7800 Pure hypercholesterolemia, unspecified: Secondary | ICD-10-CM | POA: Insufficient documentation

## 2013-10-07 DIAGNOSIS — E876 Hypokalemia: Secondary | ICD-10-CM | POA: Insufficient documentation

## 2013-10-07 DIAGNOSIS — Z87891 Personal history of nicotine dependence: Secondary | ICD-10-CM | POA: Insufficient documentation

## 2013-10-07 DIAGNOSIS — E860 Dehydration: Secondary | ICD-10-CM | POA: Insufficient documentation

## 2013-10-07 HISTORY — DX: Unspecified dementia, unspecified severity, without behavioral disturbance, psychotic disturbance, mood disturbance, and anxiety: F03.90

## 2013-10-07 HISTORY — DX: Atherosclerotic heart disease of native coronary artery without angina pectoris: I25.10

## 2013-10-07 LAB — CBC WITH DIFFERENTIAL/PLATELET
Basophils Absolute: 0 10*3/uL (ref 0.0–0.1)
Basophils Relative: 0 % (ref 0–1)
Eosinophils Absolute: 0.1 10*3/uL (ref 0.0–0.7)
Eosinophils Relative: 1 % (ref 0–5)
HEMATOCRIT: 36.3 % (ref 36.0–46.0)
HEMOGLOBIN: 12.4 g/dL (ref 12.0–15.0)
Lymphocytes Relative: 35 % (ref 12–46)
Lymphs Abs: 3 10*3/uL (ref 0.7–4.0)
MCH: 31 pg (ref 26.0–34.0)
MCHC: 34.2 g/dL (ref 30.0–36.0)
MCV: 90.8 fL (ref 78.0–100.0)
MONO ABS: 0.7 10*3/uL (ref 0.1–1.0)
Monocytes Relative: 8 % (ref 3–12)
NEUTROS ABS: 4.8 10*3/uL (ref 1.7–7.7)
Neutrophils Relative %: 56 % (ref 43–77)
Platelets: 229 10*3/uL (ref 150–400)
RBC: 4 MIL/uL (ref 3.87–5.11)
RDW: 12.8 % (ref 11.5–15.5)
WBC: 8.6 10*3/uL (ref 4.0–10.5)

## 2013-10-07 LAB — BASIC METABOLIC PANEL
BUN: 27 mg/dL — ABNORMAL HIGH (ref 6–23)
CO2: 31 mEq/L (ref 19–32)
CREATININE: 1.36 mg/dL — AB (ref 0.50–1.10)
Calcium: 10.1 mg/dL (ref 8.4–10.5)
Chloride: 96 mEq/L (ref 96–112)
GFR calc Af Amer: 41 mL/min — ABNORMAL LOW (ref >90)
GFR calc non Af Amer: 35 mL/min — ABNORMAL LOW (ref >90)
Glucose, Bld: 104 mg/dL — ABNORMAL HIGH (ref 70–99)
POTASSIUM: 3.3 meq/L — AB (ref 3.7–5.3)
Sodium: 139 mEq/L (ref 137–147)

## 2013-10-07 LAB — URINALYSIS, ROUTINE W REFLEX MICROSCOPIC
BILIRUBIN URINE: NEGATIVE
Glucose, UA: NEGATIVE mg/dL
Ketones, ur: NEGATIVE mg/dL
LEUKOCYTES UA: NEGATIVE
Nitrite: NEGATIVE
PH: 6 (ref 5.0–8.0)
Protein, ur: NEGATIVE mg/dL
Specific Gravity, Urine: <1.005 — ABNORMAL LOW (ref 1.005–1.030)
Urobilinogen, UA: 0.2 mg/dL (ref 0.0–1.0)

## 2013-10-07 LAB — URINE MICROSCOPIC-ADD ON

## 2013-10-07 MED ORDER — POTASSIUM CHLORIDE CRYS ER 20 MEQ PO TBCR
40.0000 meq | EXTENDED_RELEASE_TABLET | Freq: Once | ORAL | Status: AC
  Administered 2013-10-07: 40 meq via ORAL
  Filled 2013-10-07: qty 2

## 2013-10-07 NOTE — ED Notes (Signed)
Patient ambulated in hall without difficulty.

## 2013-10-07 NOTE — ED Provider Notes (Signed)
CSN: [id number]     Arrival date & time 10/07/13  0201 History   First MD Initiated Contact with Patient 10/07/13 0209     Chief Complaint  Patient presents with  . Altered Mental Status      Patient is a 80 y.o. female presenting with altered mental status. The history is provided by the patient and a relative. The history is limited by the condition of the patient (DEMENTIA).  Altered Mental Status Presenting symptoms: disorientation   Severity:  Mild Timing:  Intermittent Progression:  Unchanged Chronicity:  Recurrent Context: dementia   Associated symptoms: hallucinations   Associated symptoms: no abdominal pain, no difficulty breathing, no fever, no headaches, no vomiting and no weakness   PT PRESENTS FROM HOME FOR HALLUCINATIONS SHE LIVES WITH HUSBAND BUT IS CLOSELY FOLLOWED BY CHILDREN TONIGHT, PT REPORTED SEEING YOUNG KIDS IN HER ROOM AND WERE "TEARING IT UP"   PER SON AND DAUGHTER, PT HAS H/O DEMENTIA AND HAS HAD HALLUCINATIONS BEFORE NO NEW MEDS.  NO FALLS REPORTED NO FEVER/VOMITING PT DENIES ANY COMPLAINTS - SHE DENIES HA/CP/SOB/ABDOMINAL PAIN.  NO VOMITING IS REPORTED  PT ADMITS THAT SHE DID SEE CHILDREN IN HER ROOM TONIGHT  Past Medical History  Diagnosis Date  . Hypertension   . High cholesterol   . Dementia   . Coronary artery disease    Past Surgical History  Procedure Laterality Date  . Cholecystectomy    . Colonoscopy  09/28/2011    Procedure: COLONOSCOPY;  Surgeon: [name], MD;  Location: [facility];  Service: Endoscopy;  Laterality: N/A;  1200  . Cataract extraction w/phaco  12/13/2011    Procedure: CATARACT EXTRACTION PHACO AND INTRAOCULAR LENS PLACEMENT (IOC);  Surgeon: [name], MD;  Location: [facility];  Service: Ophthalmology;  Laterality: Right;  CDE 12.67  . Cataract extraction w/phaco  03/06/2012    Procedure: CATARACT EXTRACTION PHACO AND INTRAOCULAR LENS PLACEMENT (IOC);  Surgeon: [name], MD;  Location: [facility];  Service:  Ophthalmology;  Laterality: Left;  CDE:13.12   No family history on file. History  Substance Use Topics  . Smoking status: Former Smoker -- 0.25 packs/day for 10 years    Quit date: 07/18/2007  . Smokeless tobacco: Not on file  . Alcohol Use: No   OB History   Grav Para Term Preterm Abortions TAB SAB Ect Mult Living                 Review of Systems  Unable to perform ROS: Dementia  Constitutional: Negative for fever.  Respiratory: Negative for shortness of breath.   Cardiovascular: Negative for chest pain.  Gastrointestinal: Negative for vomiting and abdominal pain.  Neurological: Negative for weakness and headaches.  Psychiatric/Behavioral: Positive for hallucinations.      Allergies  Review of patient's allergies indicates no known allergies.  Home Medications   Current Outpatient Rx  Name  Route  Sig  Dispense  Refill  . ALPRAZolam (XANAX) 0.5 MG tablet   Oral   Take 0.5 mg by mouth 2 (two) times daily as needed. For anxiety         . aspirin EC 81 MG tablet   Oral   Take 81 mg by mouth daily.         . cetirizine (ZYRTEC) 10 MG tablet   Oral   Take 10 mg by mouth daily.         Marland Kitchen donepezil (ARICEPT) 10 MG tablet   Oral   Take  10 mg by mouth at bedtime.         . furosemide (LASIX) 20 MG tablet   Oral   Take 20 mg by mouth.         . gabapentin (NEURONTIN) 300 MG capsule   Oral   Take 300 mg by mouth 2 (two) times daily.         . hydrochlorothiazide (HYDRODIURIL) 25 MG tablet   Oral   Take 25 mg by mouth daily.         Marland Kitchen losartan (COZAAR) 100 MG tablet   Oral   Take 100 mg by mouth daily.         . memantine (NAMENDA) 10 MG tablet   Oral   Take 10 mg by mouth 2 (two) times daily.         . potassium chloride (K-DUR) 10 MEQ tablet   Oral   Take 10 mEq by mouth daily.         . simvastatin (ZOCOR) 20 MG tablet   Oral   Take 20 mg by mouth every evening.         . zolpidem (AMBIEN) 10 MG tablet   Oral   Take 10 mg  by mouth at bedtime as needed.         Marland Kitchen olmesartan-hydrochlorothiazide (BENICAR HCT) 40-25 MG per tablet   Oral   Take 1 tablet by mouth daily.           BP 131/44  Temp(Src) 97.9 F (36.6 C) (Oral)  Resp 20  SpO2 95% Physical Exam CONSTITUTIONAL: Well developed/well nourished HEAD: Normocephalic/atraumatic EYES: EOMI/PERRL ENMT: Mucous membranes moist NECK: supple no meningeal signs SPINE:entire spine nontender CV: S1/S2 noted, no murmurs/rubs/gallops noted LUNGS: Lungs are clear to auscultation bilaterally, no apparent distress ABDOMEN: soft, nontender, no rebound or guarding GU:no cva tenderness NEURO: Pt is awake/alert, moves all extremitiesx4. She is calm and cooperative No facial droop.  No arm or leg drift. She is awake and alert, but is unable to recall the date and unable to recall her birthday EXTREMITIES: pulses normal, full ROM SKIN: warm, color normal PSYCH: no abnormalities of mood noted  ED Course  Procedures  2:50 AM Pt here with family for hallucinations at home.  Per family, pt has had this before due to her dementia She has not had any recent neuroimaging Will perform ct head as well as labs including urinalysis to evaluate for UTI Family agreeable Pt calm/cooperative at this time She has no signs of acute CVA at this time 3:49 AM Discussed initial test results Pt stable, denies complaints   Pt stable BP 124/61  Pulse 58  Temp(Src) 97.9 F (36.6 C) (Oral)  Resp 16  SpO2 100% She is ambulatory without ataxia She is not aggressive.   She appears safe for d/c home - she has good followup, great family support.  Family reports she has had these symptoms previously, suspicion for acute delirium is low I advised close f/u with her neurologist Family agreeable with plan  Labs Review Labs Reviewed  BASIC METABOLIC PANEL - Abnormal; Notable for the following:    Potassium 3.3 (*)    Glucose, Bld 104 (*)    BUN 27 (*)    Creatinine, Ser  1.36 (*)    GFR calc non Af Amer 35 (*)    GFR calc Af Amer 41 (*)    All other components within normal limits  URINALYSIS, ROUTINE W REFLEX MICROSCOPIC - Abnormal; Notable for  the following:    Specific Gravity, Urine <1.005 (*)    Hgb urine dipstick TRACE (*)    All other components within normal limits  CBC WITH DIFFERENTIAL  URINE MICROSCOPIC-ADD ON   Imaging Review Ct Head Wo Contrast  10/07/2013   CLINICAL DATA:  Hypertension.  Altered mental status.  EXAM: CT HEAD WITHOUT CONTRAST  TECHNIQUE: Contiguous axial images were obtained from the base of the skull through the vertex without intravenous contrast.  COMPARISON:  MRI of the brain 12/06/2010.  FINDINGS: Mild cerebral atrophy. Patchy areas of decreased attenuation are noted throughout the deep and periventricular white matter of the cerebral hemispheres bilaterally, compatible with mild chronic microvascular ischemic disease. No acute intracranial abnormalities. Specifically, no evidence of acute intracranial hemorrhage, no definite findings of acute/subacute cerebral ischemia, no mass, mass effect, hydrocephalus or abnormal intra or extra-axial fluid collections. Visualized paranasal sinuses and mastoids are well pneumatized. No acute displaced skull fractures are identified.  IMPRESSION: 1. No acute intracranial abnormalities. 2. Mild cerebral atrophy with mild chronic microvascular ischemic changes in the cerebral white matter.   Electronically Signed   By: Vinnie Langton M.D.   On: 10/07/2013 03:30     Date: 10/07/2013 0252am  Rate: 51  Rhythm: sinus bradycardia  QRS Axis: normal  Intervals: PR shortened  ST/T Wave abnormalities: nonspecific ST changes and U wave noted  Conduction Disutrbances:first-degree A-V block   Narrative Interpretation: artifact noted  Old EKG Reviewed: pt with biphasic p waves, seen in prior.  appears to have U waves that are new in V2/V3    MDM   Final diagnoses:  Dementia  Dehydration   Hypokalemia    Nursing notes including past medical history and social history reviewed and considered in documentation Labs/vital reviewed and considered     Sharyon Cable, MD 10/07/13 [phone]

## 2013-10-07 NOTE — ED Notes (Signed)
Pt with known dementia, tonight was telling family members that there were "kids in her room" and that "they were tearing it up"   Pt is awake and alert, appears nad, no complaints

## 2014-07-31 ENCOUNTER — Encounter (HOSPITAL_COMMUNITY): Payer: Self-pay | Admitting: Ophthalmology

## 2014-09-11 ENCOUNTER — Ambulatory Visit (INDEPENDENT_AMBULATORY_CARE_PROVIDER_SITE_OTHER): Payer: Medicare Other | Admitting: Neurology

## 2014-09-11 ENCOUNTER — Encounter: Payer: Self-pay | Admitting: Neurology

## 2014-09-11 VITALS — BP 102/59 | HR 64 | Wt 188.8 lb

## 2014-09-11 DIAGNOSIS — E538 Deficiency of other specified B group vitamins: Secondary | ICD-10-CM

## 2014-09-11 DIAGNOSIS — G309 Alzheimer's disease, unspecified: Secondary | ICD-10-CM

## 2014-09-11 DIAGNOSIS — F0280 Dementia in other diseases classified elsewhere without behavioral disturbance: Secondary | ICD-10-CM

## 2014-09-11 HISTORY — DX: Dementia in other diseases classified elsewhere, unspecified severity, without behavioral disturbance, psychotic disturbance, mood disturbance, and anxiety: F02.80

## 2014-09-11 NOTE — Progress Notes (Signed)
Reason for visit: Dementia  [name] is a 81 y.o. female  History of present illness:  [name] is an 81 year old right-handed black female with Alzheimer's disease. She has had memory issues for least 7 to 8 years, originally diagnosed in 2006, according to family members. She lives at home with her husband, and family members help out at times. The patient is able to bathe herself, dress herself, and feed herself. She requires assistance with keeping up with medications, she does not cook, do the finances, or operate a motor vehicle. The patient sleeps well at night, and she has a good energy level. She denies any focal numbness or weakness of the face, arms, or legs. She does have some mild balance issues and she will fall on occasion. She denies any issues controlling the bowels or the bladder. A CT scan of the brain was done approximately one year ago, and this did not show any evidence of hydrocephalus or significant cerebrovascular disease. The patient is on Namenda and Aricept, and she is tolerating the medications well. The family has asked for second opinion regarding her memory issues. A brother and a sister also had memory issues.  Past Medical History  Diagnosis Date  . Hypertension   . High cholesterol   . Dementia   . Coronary artery disease   . Alzheimer's disease 09/11/2014    Past Surgical History  Procedure Laterality Date  . Cholecystectomy    . Colonoscopy  09/28/2011    Procedure: COLONOSCOPY;  Surgeon: [name], MD;  [facility]: [facility];  Service: Endoscopy;  Laterality: N/A;  1200  . Cataract extraction w/phaco  12/13/2011    Procedure: CATARACT EXTRACTION PHACO AND INTRAOCULAR LENS PLACEMENT (IOC);  Surgeon: [name], MD;  Location: [facility];  Service: Ophthalmology;  Laterality: Right;  CDE 12.67  . Cataract extraction w/phaco  03/06/2012    Procedure: CATARACT EXTRACTION PHACO AND INTRAOCULAR LENS PLACEMENT (IOC);  Surgeon: Elta Guadeloupe T.  Gershon Crane, MD;  Location: [facility];  Service: Ophthalmology;  Laterality: Left;  CDE:13.12    Family History  Problem Relation Age of Onset  . Dementia Sister   . Dementia Brother     Social history:  reports that she quit smoking about 7 years ago. She has never used smokeless tobacco. She reports that she does not drink alcohol or use illicit drugs.  Medications:  Prior to Admission medications   Medication Sig Start Date End Date Taking? Authorizing Provider  ALPRAZolam [name]) 0.5 MG tablet Take 0.5 mg by mouth 2 (two) times daily as needed. For anxiety   Yes Historical Provider, MD  aspirin EC 81 MG tablet Take 81 mg by mouth daily.   Yes Historical Provider, MD  donepezil (ARICEPT) 10 MG tablet Take 10 mg by mouth at bedtime.   Yes Historical Provider, MD  furosemide (LASIX) 20 MG tablet Take 20 mg by mouth.   Yes Historical Provider, MD  gabapentin (NEURONTIN) 300 MG capsule Take 300 mg by mouth 2 (two) times daily.   Yes Historical Provider, MD  hydrochlorothiazide (HYDRODIURIL) 25 MG tablet Take 25 mg by mouth daily.   Yes Historical Provider, MD  losartan (COZAAR) 100 MG tablet Take 100 mg by mouth daily.   Yes Historical Provider, MD  memantine (NAMENDA) 10 MG tablet Take 10 mg by mouth 2 (two) times daily.   Yes Historical Provider, MD  potassium chloride (K-DUR) 10 MEQ tablet Take 10 mEq by mouth daily.  Yes Historical Provider, MD  simvastatin (ZOCOR) 20 MG tablet Take 20 mg by mouth every evening.   Yes Historical Provider, MD  traZODone (DESYREL) 50 MG tablet Take 50 mg by mouth at bedtime. 08/15/14  Yes Historical Provider, MD      No Known Allergies  ROS:  Out of a complete 14 system review of symptoms, the patient complains only of the following symptoms, and all other reviewed systems are negative.  Weight gain Feeling hot, cold Memory loss, confusion Hallucinations  Blood pressure 102/59, pulse 64, weight 188 lb 12.8 oz (85.639 kg).  Physical Exam  General:  The patient is alert and cooperative at the time of the examination. The patient is minimally obese.  Eyes: Pupils are equal, round, and reactive to light. Discs are flat bilaterally.  Neck: The neck is supple, no carotid bruits are noted.  Respiratory: The respiratory examination is clear.  Cardiovascular: The cardiovascular examination reveals a regular rate and rhythm, no obvious murmurs or rubs are noted.  Skin: Extremities are without significant edema.  Neurologic Exam  Mental status: The Mini-Mental Status Examination done today shows a total score of 13/30.  Cranial nerves: Facial symmetry is present. There is good sensation of the face to pinprick and soft touch bilaterally. The strength of the facial muscles and the muscles to head turning and shoulder shrug are normal bilaterally. Speech is well enunciated, no aphasia or dysarthria is noted. Extraocular movements are full. Visual fields are full. The tongue is midline, and the patient has symmetric elevation of the soft palate. No obvious hearing deficits are noted.  Motor: The motor testing reveals 5 over 5 strength of all 4 extremities. Good symmetric motor tone is noted throughout.  Sensory: Sensory testing is intact to pinprick, soft touch, and vibration sensation on all 4 extremities. No evidence of extinction is noted.  Coordination: Cerebellar testing reveals good finger-nose-finger and heel-to-shin bilaterally. Significant apraxia of all 4 extremities is noted.  Gait and station: Gait is wide-based, slightly unsteady. Tandem gait was not tested. Romberg is negative. No drift is seen.  Reflexes: Deep tendon reflexes are symmetric, but are depressed bilaterally. Toes are downgoing bilaterally.   CT head 10/07/13:  IMPRESSION: 1. No acute intracranial abnormalities. 2. Mild cerebral atrophy with mild chronic microvascular ischemic changes in the cerebral white matter.  * CT images were reviewed online, and I agree  with the written report.    Assessment/Plan:  1. Progressive dementia, Alzheimer's disease  The patient is on Aricept and Namenda. I have very little else to offer the patient. The patient may try coconut oil or one of the commercial preparations such as Axona or Vayacog. The patient will follow-up in 6-8 months, as the family wishes her to follow up through this office. We will check some blood work today.  Jill Alexanders MD 09/11/2014 8:39 PM  [facility] [address] [place], [place] [address]  Phone [phone] Fax [phone]

## 2014-09-11 NOTE — Patient Instructions (Signed)
Alzheimer Disease Alzheimer disease is a mental disorder. It causes memory loss and loss of other mental functions, such as learning, thinking, problem solving, communicating, and completing tasks. The mental losses interfere with the ability to perform daily activities at work, at home, or in social situations. Alzheimer disease usually starts in a person's late 60s or early 70s but can start earlier in life (familial form). The mental changes caused by this disease are permanent and worsen over time. As the illness progresses, the ability to do even the simplest things is lost. Survival with Alzheimer disease ranges from several years to as long as 20 years. CAUSES Alzheimer disease is caused by abnormally high levels of a protein (beta-amyloid) in the brain. This protein forms very small deposits within and around the brain's nerve cells. These deposits prevent the nerve cells from working properly. Experts are not certain what causes the beta-amyloid deposits in this disease. RISK FACTORS The following major risk factors have been identified:  Increasing age.  Certain genetic variations, such as Down syndrome (trisomy 21). SYMPTOMS In the early stages of Alzheimer disease, you are still able to perform daily activities but need greater effort, more time, or memory aids. Early symptoms include:  Mild memory loss of recent events, names, or phone numbers.  Loss of objects.  Minor loss of vocabulary.  Difficulty with complex tasks, such as paying bills or driving in unfamiliar locations. Other mental functions deteriorate as the disease worsens. These changes slowly go from mild to severe. Symptoms at this stage include:  Difficulty remembering. You may not be able to recall personal information such as your address and telephone number. You may become confused about the date, the season of the year, or your location.  Difficulty maintaining attention. You may forget what you wanted to say  during conversations and repeat what you have already said.  Difficulty learning new information or tasks. You may not remember what you read or the name of a new friend you met.  Difficulty counting or doing math. You may have difficulty with complex math problems. You may make mistakes in paying bills or managing your checkbook.  Poor reasoning and judgment. You may make poor decisions or not dress right for the weather.  Difficulty communicating. You may have regular difficulty remembering words, naming objects, expressing yourself clearly, or writing sentences that make sense.  Difficulty performing familiar daily activities. You may get lost driving in familiar locations or need help eating, bathing, dressing, grooming, or using the toilet. You may have difficulty maintaining bladder or bowel control.  Difficulty recognizing familiar faces. You may confuse family members or close friends with one another. You may not recognize a close relative or may mistake strangers for family. Alzheimer disease also may cause changes in personality and behavior. These changes include:   Loss of interest or motivation.  Social withdrawal.  Anxiety.  Difficulty sleeping.  Uncharacteristic anger or combativeness.  A false belief that someone is trying to harm you (paranoia).  Seeing things that are not real (hallucinations).  Agitation. Confusion and disruptive behavior are often worse at night and may be triggered by changes in the environment or acute medical issues. DIAGNOSIS  Alzheimer disease is diagnosed through an assessment by your health care provider. During this assessment, your health care provider will do the following:  Ask you and your family, friends, or caregivers questions about your symptoms, their frequency, their duration and progression, and the effect they are having on your life.    Ask questions about your personal and family medical history and use of alcohol or drugs,  including prescription medicine.  Perform a physical exam and order blood tests and brain imaging exams. Your health care provider may refer you to a specialist for detailed evaluation of your mental functions (neuropsychological testing).  Many different brain disorders, medical conditions, and certain substances can cause symptoms that resemble Alzheimer disease symptoms. These must be ruled out before this disease can be diagnosed. If Alzheimer disease is diagnosed, it will be considered either "possible" or "probable" Alzheimer disease. "Possible" Alzheimer disease means that your symptoms are typical of the disease and no other disorder is causing them. "Probable" Alzheimer disease means that you also have a family history of the disease or genetic test results that support the diagnosis. Certain tests, mostly used in research studies, are highly specific for Alzheimer disease.  TREATMENT  There is currently no cure for this disease. The goals of treatment are to:  Slow down the progression of the disease.  Preserve mental function as long as possible.  Manage behavioral symptoms.  Make life easier for the person with Alzheimer disease and his or her caregivers. The following treatment options are available:  Medicine. Certain medicines may help slow memory loss by changing the level of certain chemicals in the brain. Medicine may also help with behavioral symptoms.  Talk therapy. Talk therapy provides education, support, and memory aids for people with this disease. It is most effective in the early stages of the illness.  Caregiving. Caregivers may be family members, friends, or trained medical professionals. They help the person with Alzheimer disease with daily life activities. Caregiving may take place at home or at a nursing facility.  Family support groups. These provide education, emotional support, and information about community resources to family members who are taking care of  the person with this disease. Document Released: 03/15/2004 Document Revised: 11/18/2013 Document Reviewed: 11/09/2012 Exit[employer] Patient Information 2015 [employer]. This information is not intended to replace advice given to you by your health care provider. Make sure you discuss any questions you have with your health care provider.  

## 2014-09-15 LAB — VITAMIN B12: Vitamin B-12: 556 pg/mL (ref 211–946)

## 2014-09-15 LAB — RPR: RPR Ser Ql: NONREACTIVE

## 2014-09-15 LAB — METHYLMALONIC ACID, SERUM: METHYLMALONIC ACID: 222 nmol/L (ref 0–378)

## 2014-09-15 NOTE — Progress Notes (Signed)
Quick Note:  Spoke to patient's sister in law and relayed blood work results as unremarkable, per Dr. Jannifer Franklin. ______

## 2015-04-13 ENCOUNTER — Emergency Department (HOSPITAL_COMMUNITY)
Admission: EM | Admit: 2015-04-13 | Discharge: 2015-04-13 | Disposition: A | Discharge status: Home / Self Care | Payer: Medicare Other | Attending: Emergency Medicine | Admitting: Emergency Medicine

## 2015-04-13 ENCOUNTER — Encounter (HOSPITAL_COMMUNITY): Payer: Self-pay | Admitting: Emergency Medicine

## 2015-04-13 ENCOUNTER — Emergency Department (HOSPITAL_COMMUNITY): Payer: Medicare Other

## 2015-04-13 DIAGNOSIS — W108XXA Fall (on) (from) other stairs and steps, initial encounter: Secondary | ICD-10-CM | POA: Diagnosis not present

## 2015-04-13 DIAGNOSIS — Y9289 Other specified places as the place of occurrence of the external cause: Secondary | ICD-10-CM | POA: Diagnosis not present

## 2015-04-13 DIAGNOSIS — I10 Essential (primary) hypertension: Secondary | ICD-10-CM | POA: Insufficient documentation

## 2015-04-13 DIAGNOSIS — Z87891 Personal history of nicotine dependence: Secondary | ICD-10-CM | POA: Diagnosis not present

## 2015-04-13 DIAGNOSIS — S52571A Other intraarticular fracture of lower end of right radius, initial encounter for closed fracture: Secondary | ICD-10-CM | POA: Insufficient documentation

## 2015-04-13 DIAGNOSIS — Z7982 Long term (current) use of aspirin: Secondary | ICD-10-CM | POA: Diagnosis not present

## 2015-04-13 DIAGNOSIS — S52501A Unspecified fracture of the lower end of right radius, initial encounter for closed fracture: Secondary | ICD-10-CM

## 2015-04-13 DIAGNOSIS — E780 Pure hypercholesterolemia: Secondary | ICD-10-CM | POA: Diagnosis not present

## 2015-04-13 DIAGNOSIS — I2510 Atherosclerotic heart disease of native coronary artery without angina pectoris: Secondary | ICD-10-CM | POA: Insufficient documentation

## 2015-04-13 DIAGNOSIS — S6991XA Unspecified injury of right wrist, hand and finger(s), initial encounter: Secondary | ICD-10-CM | POA: Diagnosis present

## 2015-04-13 DIAGNOSIS — F0390 Unspecified dementia without behavioral disturbance: Secondary | ICD-10-CM | POA: Diagnosis not present

## 2015-04-13 DIAGNOSIS — Y998 Other external cause status: Secondary | ICD-10-CM | POA: Insufficient documentation

## 2015-04-13 DIAGNOSIS — S52691A Other fracture of lower end of right ulna, initial encounter for closed fracture: Secondary | ICD-10-CM | POA: Insufficient documentation

## 2015-04-13 DIAGNOSIS — S52601A Unspecified fracture of lower end of right ulna, initial encounter for closed fracture: Secondary | ICD-10-CM

## 2015-04-13 DIAGNOSIS — Y9389 Activity, other specified: Secondary | ICD-10-CM | POA: Insufficient documentation

## 2015-04-13 DIAGNOSIS — Z79899 Other long term (current) drug therapy: Secondary | ICD-10-CM | POA: Insufficient documentation

## 2015-04-13 MED ORDER — TRAMADOL-ACETAMINOPHEN 37.5-325 MG PO TABS: 1.0000 | ORAL_TABLET | Freq: Four times a day (QID) | ORAL | Status: DC | PRN

## 2015-04-13 NOTE — ED Notes (Signed)
PT and family member state pt lost her balance yesterday and caught her fall on her right hand and wrist. PT c/o pain to right wrist and hand with swelling noted.

## 2015-04-13 NOTE — ED Provider Notes (Signed)
CSN: [id number]     Arrival date & time 04/13/15  1540 History   First MD Initiated Contact with Patient 04/13/15 1706     Chief Complaint  Patient presents with  . Fall     (Consider location/radiation/quality/duration/timing/severity/associated sxs/prior Treatment) HPI     [name] is a 81 y.o. female who is here for injury to right wrist, last night. She was well healed. Some steps, tripped and fell catching herself with her right hand. The only injury was to the right wrist. Patient was able to eat today, ambulate and is acting her usual self other than the right wrist pain. There are no other known modifying factors.       Past Medical History  Diagnosis Date  . Hypertension   . High cholesterol   . Dementia   . Coronary artery disease   . Alzheimer's disease 09/11/2014   Past Surgical History  Procedure Laterality Date  . Cholecystectomy    . Colonoscopy  09/28/2011    Procedure: COLONOSCOPY;  Surgeon: [name], MD;  [facility];  Service: Endoscopy;  Laterality: N/A;  1200  . Cataract extraction w/phaco  12/13/2011    Procedure: CATARACT EXTRACTION PHACO AND INTRAOCULAR LENS PLACEMENT (IOC);  Surgeon: [name], MD;  Location: [facility];  Service: Ophthalmology;  Laterality: Right;  CDE 12.67  . Cataract extraction w/phaco  03/06/2012    Procedure: CATARACT EXTRACTION PHACO AND INTRAOCULAR LENS PLACEMENT (IOC);  Surgeon: [name], MD;  Location: [facility];  Service: Ophthalmology;  Laterality: Left;  CDE:13.12   Family History  Problem Relation Age of Onset  . Dementia Sister   . Dementia Brother    Social History  Substance Use Topics  . Smoking status: Former Smoker -- 0.25 packs/day for 10 years    Quit date: 07/18/2007  . Smokeless tobacco: Never Used  . Alcohol Use: No   OB History    No data available     Review of Systems  All other systems reviewed and are negative.     Allergies  Review of patient's allergies  indicates no known allergies.  Home Medications   Prior to Admission medications   Medication Sig Start Date End Date Taking? Authorizing Provider  ALPRAZolam [name]) 0.5 MG tablet Take 0.5 mg by mouth 3 (three) times daily. For anxiety   Yes Historical Provider, MD  aspirin EC 81 MG tablet Take 81 mg by mouth daily.   Yes Historical Provider, MD  donepezil (ARICEPT) 10 MG tablet Take 10 mg by mouth at bedtime.   Yes Historical Provider, MD  furosemide (LASIX) 20 MG tablet Take 20 mg by mouth daily.    Yes Historical Provider, MD  gabapentin (NEURONTIN) 300 MG capsule Take 300 mg by mouth 2 (two) times daily.   Yes Historical Provider, MD  hydrochlorothiazide (HYDRODIURIL) 25 MG tablet Take 25 mg by mouth daily.   Yes Historical Provider, MD  losartan (COZAAR) 100 MG tablet Take 100 mg by mouth daily.   Yes Historical Provider, MD  memantine (NAMENDA) 10 MG tablet Take 10 mg by mouth 2 (two) times daily.   Yes Historical Provider, MD  Multiple Vitamin (MULTIVITAMIN WITH MINERALS) TABS tablet Take 1 tablet by mouth daily.   Yes Historical Provider, MD  potassium chloride SA (K-DUR,KLOR-CON) 20 MEQ tablet Take 20 mEq by mouth daily.   Yes Historical Provider, MD  simvastatin (ZOCOR) 20 MG tablet Take 20 mg by mouth every evening.  Yes Historical Provider, MD  traZODone (DESYREL) 50 MG tablet Take 50 mg by mouth at bedtime. 08/15/14  Yes Historical Provider, MD   BP 113/40 mmHg  Pulse 62  Temp(Src) 98.3 F (36.8 C) (Oral)  Resp 18  Ht 5\' 4"  (1.626 m)  Wt 170 lb (77.111 kg)  BMI 29.17 kg/m2  SpO2 100% Physical Exam  Constitutional: She is oriented to person, place, and time. She appears well-developed and well-nourished.  HENT:  Head: Normocephalic and atraumatic.  Right Ear: External ear normal.  Left Ear: External ear normal.  Eyes: Conjunctivae and EOM are normal. Pupils are equal, round, and reactive to light.  Neck: Normal range of motion and phonation normal. Neck supple.   Cardiovascular: Normal rate and regular rhythm.   Pulmonary/Chest: Effort normal and breath sounds normal. She exhibits no tenderness and no bony tenderness.  Musculoskeletal:  Tender and swollen right rest, without significant deformity. No tenderness or swelling of the right shoulder or elbow. Neurovascular intact distally in the right hand, and fingers.  Neurological: She is alert and oriented to person, place, and time. No cranial nerve deficit or sensory deficit. She exhibits normal muscle tone. Coordination normal.  Skin: Skin is warm, dry and intact.  Psychiatric: She has a normal mood and affect. Her behavior is normal. Judgment and thought content normal.  Nursing note and vitals reviewed.   ED Course  Procedures (including critical care time) Medications - No data to display  Patient Vitals for the past 24 hrs:  BP Temp Temp src Pulse Resp SpO2 Height Weight  04/13/15 1756 112/58 mmHg - - (!) 59 16 100 % - -  04/13/15 1543 (!) 113/40 mmHg 98.3 F (36.8 C) Oral 62 18 100 % 5\' 4"  (1.626 m) 170 lb (77.111 kg)   18:05- case discussed with orthopedics, hand surgery, Dr. Burney Gauze, will see the patient in the office in 1 or 3 days.  6:24 PM Reevaluation with update and discussion. After initial assessment and treatment, an updated evaluation reveals splint ordered, family updated on findings. All questions answered.. [place] Review Labs Reviewed - No data to display  Imaging Review Dg Wrist Complete Right  04/13/2015   CLINICAL DATA:  Fall on right hand 1 day prior.  EXAM: RIGHT WRIST - COMPLETE 3+ VIEW  COMPARISON:  None.  FINDINGS: There is a comminuted intraarticular right distal radius fracture with 5 mm dorsal displacement of the dominant distal fracture fragment and mild impaction of the fracture fragments. There is a comminuted intra-articular right distal ulnar fracture with mild impaction, mild apex ulnar angulation and no appreciable displacement. No  additional fracture is seen in the right wrist. No dislocation or suspicious focal osseous lesion is seen in the right wrist. Moderate osteoarthritis is present at the scaphoid- trapezium and first carpometacarpal joints. There is prominent soft tissue swelling throughout the right wrist surrounding the fracture sites.  IMPRESSION: 1. Mildly displaced comminuted impacted intra-articular right distal radius fracture. 2. Impacted, angulated and nondisplaced intra-articular right distal ulna fracture. 3. 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   Electronically Signed   By: Ilona Sorrel M.D.   On: 04/13/2015 16:43   Dg Hand Complete Right  04/13/2015   CLINICAL DATA:  Right hand, wrist pain, swelling.  Fall yesterday.  EXAM: RIGHT HAND - COMPLETE 3+ VIEW  COMPARISON:  None.  FINDINGS: There is a comminuted fracture through the distal right radius, better visualized on the wrist series. Mildly displaced fracture  fragments. Probable ulnar metaphyseal fracture.  Degenerative changes in the IP joints diffusely. No additional acute bony abnormality.  IMPRESSION: Distal right radial and ulnar fractures, better seen on today's wrist series. No additional acute bony abnormality.   Electronically Signed   By: Rolm Baptise M.D.   On: 04/13/2015 16:41   I have personally reviewed and evaluated these images and lab results as part of my medical decision-making.   EKG Interpretation None      MDM   Final diagnoses:  Fracture of radius and ulna, distal, right, closed, initial encounter    Mechanical fall with isolated injury to right wrist. Patient has comminution of the distal radius and all the right, and will need close follow-up with possible reduction, done as an outpatient by the hand surgeon.  Nursing Notes Reviewed/ Care Coordinated Applicable Imaging Reviewed Interpretation of Laboratory Data incorporated into ED treatment  The patient appears reasonably screened and/or stabilized for  discharge and I doubt any other medical condition or other [facility] requiring further screening, evaluation, or treatment in the ED at this time prior to discharge.  Plan: Home Medications- Ultracet; Home Treatments- elevate right wrist; return here if the recommended treatment, does not improve the symptoms; Recommended follow up- Dr. Burney Gauze in 1-3 days     Daleen Bo, MD 04/13/15 [phone]

## 2015-04-13 NOTE — Discharge Instructions (Signed)
Wear the arm sling for comfort. Use ice on the sore spot 3 or 4 times a day. Call the hand surgeon in the morning for an appointment to be seen in 1 or 3 days   Wrist Fracture A wrist fracture is a break or crack in one of the bones of your wrist. Your wrist is made up of eight small bones at the palm of your hand (carpal bones) and two long bones that make up your forearm (radius and ulna).  CAUSES   A direct blow to the wrist.  Falling on an outstretched hand.  Trauma, such as a car accident or a fall. RISK FACTORS Risk factors for wrist fracture include:   Participating in contact and high-risk sports, such as skiing, biking, and ice skating.  Taking steroid medicines.  Smoking.  Being female.  Being Caucasian.  Drinking more than three alcoholic beverages per day.  Having low or lowered bone density (osteoporosis or osteopenia).  Age. Older adults have decreased bone density.  Women who have had menopause.  History of previous fractures. SIGNS AND SYMPTOMS Symptoms of wrist fractures include tenderness, bruising, and inflammation. Additionally, the wrist may hang in an odd position or appear deformed.  DIAGNOSIS Diagnosis may include:  Physical exam.  X-ray. TREATMENT Treatment depends on many factors, including the nature and location of the fracture, your age, and your activity level. Treatment for wrist fracture can be nonsurgical or surgical.  Nonsurgical Treatment A plaster cast or splint may be applied to your wrist if the bone is in a good position. If the fracture is not in good position, it may be necessary for your health care provider to realign it before applying a splint or cast. Usually, a cast or splint will be worn for several weeks.  Surgical Treatment Sometimes the position of the bone is so far out of place that surgery is required to apply a device to hold it together as it heals. Depending on the fracture, there are a number of options for  holding the bone in place while it heals, such as a cast and metal pins.  HOME CARE INSTRUCTIONS  Keep your injured wrist elevated and move your fingers as much as possible.  Do not put pressure on any part of your cast or splint. It may break.   Use a plastic bag to protect your cast or splint from water while bathing or showering. Do not lower your cast or splint into water.  Take medicines only as directed by your health care provider.  Keep your cast or splint clean and dry. If it becomes wet, damaged, or suddenly feels too tight, contact your health care provider right away.  Do not use any tobacco products including cigarettes, chewing tobacco, or electronic cigarettes. Tobacco can delay bone healing. If you need help quitting, ask your health care provider.  Keep all follow-up visits as directed by your health care provider. This is important.  Ask your health care provider if you should take supplements of calcium and vitamins C and D to promote bone healing. SEEK MEDICAL CARE IF:   Your cast or splint is damaged, breaks, or gets wet.  You have a fever.  You have chills.  You have continued severe pain or more swelling than you did before the cast was put on. SEEK IMMEDIATE MEDICAL CARE IF:   Your hand or fingernails on the injured arm turn blue or gray, or feel cold or numb.  You have decreased feeling in  the fingers of your injured arm. MAKE SURE YOU:  Understand these instructions.  Will watch your condition.  Will get help right away if you are not doing well or get worse. Document Released: 04/13/2005 Document Revised: 11/18/2013 Document Reviewed: 07/22/2011 [facility] Patient Information 2015 [place], [place]. This information is not intended to replace advice given to you by your health care provider. Make sure you discuss any questions you have with your health care provider.  Splint Care Splints protect and rest injuries. Splints can be made of plaster,  fiberglass, or metal. They are used to treat broken bones, sprains, tendonitis, and other injuries. HOME CARE  Keep the injured area raised (elevated) while sitting or lying down. Keep the injured body part just above the level of the heart. This will decrease puffiness (swelling) and pain.  If an elastic bandage was used to hold the splint, it can be loosened. Only loosen it to make room for puffiness and to ease pain.  Keep the splint clean and dry.  Do not scratch the skin under the splint with sharp or pointed objects.  Follow up with your doctor as told. GET HELP RIGHT AWAY IF:   There is more pain or pressure around the injury.  There is numbness, tingling, or pain in the toes or fingers past the injury.  The fingers or toes become cold or blue.  The splint becomes too soft or breaks before the injury is healed. MAKE SURE YOU:   Understand these instructions.  Will watch this condition.  Will get help right away if you are not doing well or get worse. Document Released: 04/12/2008 Document Revised: 09/26/2011 Document Reviewed: 04/12/2008 [facility] Patient Information 2015 [place], [place]. This information is not intended to replace advice given to you by your health care provider. Make sure you discuss any questions you have with your health care provider.

## 2015-04-14 ENCOUNTER — Ambulatory Visit (INDEPENDENT_AMBULATORY_CARE_PROVIDER_SITE_OTHER): Payer: Medicare Other | Admitting: Adult Health

## 2015-04-14 ENCOUNTER — Encounter: Payer: Self-pay | Admitting: Adult Health

## 2015-04-14 ENCOUNTER — Ambulatory Visit: Payer: Self-pay | Admitting: Neurology

## 2015-04-14 ENCOUNTER — Ambulatory Visit: Payer: Medicare Other | Admitting: Neurology

## 2015-04-14 VITALS — BP 100/60 | HR 58 | Ht 62.0 in | Wt 185.0 lb

## 2015-04-14 DIAGNOSIS — G309 Alzheimer's disease, unspecified: Secondary | ICD-10-CM | POA: Diagnosis not present

## 2015-04-14 DIAGNOSIS — F0280 Dementia in other diseases classified elsewhere without behavioral disturbance: Secondary | ICD-10-CM

## 2015-04-14 MED ORDER — TRAZODONE HCL 50 MG PO TABS: 50.0000 mg | ORAL_TABLET | Freq: Every day | ORAL | Status: DC

## 2015-04-14 MED ORDER — MEMANTINE HCL 10 MG PO TABS: 10.0000 mg | ORAL_TABLET | Freq: Two times a day (BID) | ORAL | Status: DC

## 2015-04-14 MED ORDER — DONEPEZIL HCL 10 MG PO TABS: 10.0000 mg | ORAL_TABLET | Freq: Every day | ORAL | Status: DC

## 2015-04-14 NOTE — Patient Instructions (Signed)
Memory score is stable.  Continue aricept, namenda and trazodone If your symptoms worsen or you develop new symptoms please let us know.

## 2015-04-14 NOTE — Progress Notes (Signed)
PATIENT: [name] DOB: February 15, 1930  REASON FOR VISIT: follow up- Alzheimer's disease HISTORY FROM: patient  HISTORY OF PRESENT ILLNESS: [name] is an 81 year old female with a history of Alzheimer's disease. She returns today for follow-up. She is currently taking Aricept and Namenda and tolerating both medications well. She lives at home with her son. She is able to complete all ADLs independently. She no longer operates a motor vehicle. She does not prepare her own meals. Her children help her with this. The patient recently had a fall and broke her right arm. She will be having surgery this Friday. She currently uses trazodone to help her sleep at night. She returns today for an evaluation.  HISTORY 09/11/14: [name] is an 81 year old right-handed black female with Alzheimer's disease. She has had memory issues for least 7 to 8 years, originally diagnosed in 2006, according to family members. She lives at home with her husband, and family members help out at times. The patient is able to bathe herself, dress herself, and feed herself. She requires assistance with keeping up with medications, she does not cook, do the finances, or operate a motor vehicle. The patient sleeps well at night, and she has a good energy level. She denies any focal numbness or weakness of the face, arms, or legs. She does have some mild balance issues and she will fall on occasion. She denies any issues controlling the bowels or the bladder. A CT scan of the brain was done approximately one year ago, and this did not show any evidence of hydrocephalus or significant cerebrovascular disease. The patient is on Namenda and Aricept, and she is tolerating the medications well. The family has asked for second opinion regarding her memory issues. A brother and a sister also had memory issues.  REVIEW OF SYSTEMS: Out of a complete 14 system review of symptoms, the patient complains only of the following  symptoms, and all other reviewed systems are negative.  Memory loss  ALLERGIES: No Known Allergies  HOME MEDICATIONS: Outpatient Prescriptions Prior to Visit  Medication Sig Dispense Refill  . ALPRAZolam (XANAX) 0.5 MG tablet Take 0.5 mg by mouth 3 (three) times daily. For anxiety    . aspirin EC 81 MG tablet Take 81 mg by mouth daily.    Marland Kitchen donepezil (ARICEPT) 10 MG tablet Take 10 mg by mouth at bedtime.    . furosemide (LASIX) 20 MG tablet Take 20 mg by mouth daily.     Marland Kitchen gabapentin (NEURONTIN) 300 MG capsule Take 300 mg by mouth 2 (two) times daily.    . hydrochlorothiazide (HYDRODIURIL) 25 MG tablet Take 25 mg by mouth daily.    Marland Kitchen losartan (COZAAR) 100 MG tablet Take 100 mg by mouth daily.    . memantine (NAMENDA) 10 MG tablet Take 10 mg by mouth 2 (two) times daily.    . Multiple Vitamin (MULTIVITAMIN WITH MINERALS) TABS tablet Take 1 tablet by mouth daily.    . potassium chloride SA (K-DUR,KLOR-CON) 20 MEQ tablet Take 20 mEq by mouth daily.    . simvastatin (ZOCOR) 20 MG tablet Take 20 mg by mouth every evening.    . traMADol-acetaminophen (ULTRACET) 37.5-325 MG per tablet Take 1 tablet by mouth every 6 (six) hours as needed. 30 tablet 0  . traZODone (DESYREL) 50 MG tablet Take 50 mg by mouth at bedtime.  1   No facility-administered medications prior to visit.    PAST MEDICAL HISTORY: Past Medical History  Diagnosis Date  .  Hypertension   . High cholesterol   . Dementia   . Coronary artery disease   . Alzheimer's disease 09/11/2014    PAST SURGICAL HISTORY: Past Surgical History  Procedure Laterality Date  . Cholecystectomy    . Colonoscopy  09/28/2011    Procedure: COLONOSCOPY;  Surgeon: [name], MD;  [facility];  Service: Endoscopy;  Laterality: N/A;  1200  . Cataract extraction w/phaco  12/13/2011    Procedure: CATARACT EXTRACTION PHACO AND INTRAOCULAR LENS PLACEMENT (IOC);  Surgeon: [name], MD;  Location: [facility];  Service:  Ophthalmology;  Laterality: Right;  CDE 12.67  . Cataract extraction w/phaco  03/06/2012    Procedure: CATARACT EXTRACTION PHACO AND INTRAOCULAR LENS PLACEMENT (IOC);  Surgeon: [name], MD;  Location: [facility];  Service: Ophthalmology;  Laterality: Left;  CDE:13.12    FAMILY HISTORY: Family History  Problem Relation Age of Onset  . Dementia Sister   . Dementia Brother     SOCIAL HISTORY: Social History   Social History  . Marital Status: Married    Spouse Name: N/A  . Number of Children: 6  . Years of Education: N/A   Occupational History  . Not on file.   Social History Main Topics  . Smoking status: Former Smoker -- 0.25 packs/day for 10 years    Quit date: 07/18/2007  . Smokeless tobacco: Never Used  . Alcohol Use: No  . Drug Use: No  . Sexual Activity: Not on file   Other Topics Concern  . Not on file   Social History Narrative   Patient lives at home with her son Cathleen Corti. Her son and [name] lives out of town.   And her sister in law is with her today ([name].) [place].    Retired.   Education 6th grade.   Right handed.   Caffeine sometimes.       PHYSICAL EXAM  Filed Vitals:   04/14/15 1328  BP: 100/60  Pulse: 58  Height: 5\' 2"  (1.575 m)  Weight: 185 lb (83.915 kg)   Body mass index is 33.83 kg/(m^2).   MMSE - Mini Mental State Exam 04/14/2015 09/11/2014  Orientation to time 3 0  Orientation to Place 3 4  Registration 0 2  Attention/ Calculation 0 0  Recall 3 0  Language- name 2 objects 1 1  Language- repeat 1 1  Language- follow 3 step command 1 3  Language- read & follow direction 1 1  Write a sentence 0 1  Copy design 0 0  Total score 13 13    Generalized: Well developed, in no acute distress   Neurological examination  Mentation: Alert. Follows all commands speech and language fluent Cranial nerve II-XII: Pupils were equal round reactive to light. Extraocular movements were full,  visual field were full on confrontational test. Facial sensation and strength were normal. Uvula tongue midline. Head turning and shoulder shrug  were normal and symmetric. Motor: The motor testing reveals 5 over 5 strength in the left upper extremity and bilateral lower extremity. Right upper extremity is in a sling due to fracture. Good symmetric motor tone is noted throughout.  Sensory: Sensory testing is intact to soft touch on all 4 extremities. No evidence of extinction is noted.  Coordination: Cerebellar testing reveals good finger-nose-finger on the left unable to test right due to fracture. and heel-to-shin bilaterally.  Gait and station: Patient's gait is slightly unsteady. Tandem gait  not attempted Reflexes: Deep tendon reflexes are symmetric and normal bilaterally.   DIAGNOSTIC DATA (LABS, IMAGING, TESTING) - I reviewed patient records, labs, notes, testing and imaging myself where available.      ASSESSMENT AND PLAN 81 y.o. year old female  has a past medical history of Hypertension; High cholesterol; Dementia; Coronary artery disease; and Alzheimer's disease (09/11/2014). here with:  1. Alzheimer's disease  Overall the patient is doing well. Her memory score is stable today she will continue on Aricept and Namenda. I will also refill her trazodone. Patient and family advised that if her symptoms worsen or she develops new symptoms she should let me know. She will follow-up in 6 months or sooner if needed.   [name], MSN, NP-C 04/14/2015, 1:48 PM [facility] [address] [place], [place] [address] [phone]

## 2015-04-14 NOTE — Progress Notes (Signed)
I have read the note, and I agree with the clinical assessment and plan.  [name],[name]   

## 2015-04-18 ENCOUNTER — Emergency Department (HOSPITAL_COMMUNITY)
Admission: EM | Admit: 2015-04-18 | Discharge: 2015-04-18 | Disposition: A | Discharge status: Home / Self Care | Payer: Medicare Other | Attending: Emergency Medicine | Admitting: Emergency Medicine

## 2015-04-18 ENCOUNTER — Encounter (HOSPITAL_COMMUNITY): Payer: Self-pay | Admitting: *Deleted

## 2015-04-18 DIAGNOSIS — Z7982 Long term (current) use of aspirin: Secondary | ICD-10-CM | POA: Diagnosis not present

## 2015-04-18 DIAGNOSIS — I2510 Atherosclerotic heart disease of native coronary artery without angina pectoris: Secondary | ICD-10-CM | POA: Insufficient documentation

## 2015-04-18 DIAGNOSIS — Z79899 Other long term (current) drug therapy: Secondary | ICD-10-CM | POA: Diagnosis not present

## 2015-04-18 DIAGNOSIS — I10 Essential (primary) hypertension: Secondary | ICD-10-CM | POA: Diagnosis not present

## 2015-04-18 DIAGNOSIS — M25531 Pain in right wrist: Secondary | ICD-10-CM

## 2015-04-18 DIAGNOSIS — G309 Alzheimer's disease, unspecified: Secondary | ICD-10-CM | POA: Diagnosis not present

## 2015-04-18 DIAGNOSIS — F0280 Dementia in other diseases classified elsewhere without behavioral disturbance: Secondary | ICD-10-CM | POA: Diagnosis not present

## 2015-04-18 DIAGNOSIS — E7800 Pure hypercholesterolemia, unspecified: Secondary | ICD-10-CM | POA: Diagnosis not present

## 2015-04-18 DIAGNOSIS — Z87891 Personal history of nicotine dependence: Secondary | ICD-10-CM | POA: Insufficient documentation

## 2015-04-18 NOTE — ED Provider Notes (Signed)
CSN: [id number]     Arrival date & time 04/18/15  1034 History   By signing my name below, I, Meriel Pica, attest that this documentation has been prepared under the direction and in the presence of Milton Ferguson, MD. Electronically Signed: Meriel Pica, ED Scribe. 04/18/2015. 11:34 AM.   Chief Complaint  Patient presents with  . Follow-up   Patient is a 81 y.o. female presenting with arm injury. The history is provided by the patient and a relative. No language interpreter was used.  Arm Injury Location:  Wrist Time since incident:  5 days Injury: yes   Mechanism of injury: fall   Wrist location:  R wrist Pain details:    Radiates to:  Does not radiate   Severity:  Moderate   Timing:  Constant Chronicity:  New Associated symptoms: no back pain and no fatigue    HPI Comments: [name] is a 81 y.o. female, with a PMhx of HTN, HLD, dementia, and CAD, who presents to the Emergency Department complaining of right wrist swelling s/p surgery 1 day ago. The pt fell 5 days ago sustaining an injury to right arm. She was evaluated in the ED and diagnosed with a closed fracture of right radius and ulna. Per son, the pt had surgery 1 day ago to repair the fracture and was discharged this morning when she removed the splint to her right wrist.   Past Medical History  Diagnosis Date  . Hypertension   . High cholesterol   . Dementia   . Coronary artery disease   . Alzheimer's disease 09/11/2014   Past Surgical History  Procedure Laterality Date  . Cholecystectomy    . Colonoscopy  09/28/2011    Procedure: COLONOSCOPY;  Surgeon: [name], MD;  Location: [facility];  Service: Endoscopy;  Laterality: N/A;  1200  . Cataract extraction w/phaco  12/13/2011    Procedure: CATARACT EXTRACTION PHACO AND INTRAOCULAR LENS PLACEMENT (IOC);  Surgeon: [name], MD;  Location: [facility];  Service: Ophthalmology;  Laterality: Right;  CDE 12.67  . Cataract extraction w/phaco   03/06/2012    Procedure: CATARACT EXTRACTION PHACO AND INTRAOCULAR LENS PLACEMENT (IOC);  Surgeon: [name], MD;  Location: [facility];  Service: Ophthalmology;  Laterality: Left;  CDE:13.12   Family History  Problem Relation Age of Onset  . Dementia Sister   . Dementia Brother    Social History  Substance Use Topics  . Smoking status: Former Smoker -- 0.25 packs/day for 10 years    Quit date: 07/18/2007  . Smokeless tobacco: Never Used  . Alcohol Use: No   OB History    No data available     Review of Systems  Constitutional: Negative for appetite change and fatigue.  HENT: Negative for congestion, ear discharge and sinus pressure.   Eyes: Negative for discharge.  Respiratory: Negative for cough.   Cardiovascular: Negative for chest pain.  Gastrointestinal: Negative for abdominal pain and diarrhea.  Genitourinary: Negative for frequency and hematuria.  Musculoskeletal: Positive for joint swelling ( right wrist). Negative for back pain.  Skin: Positive for wound ( surgical incision to ventral aspect of right wrist). Negative for rash.  Neurological: Negative for seizures and headaches.  Psychiatric/Behavioral: Negative for hallucinations.   Allergies  Review of patient's allergies indicates no known allergies.  Home Medications   Prior to Admission medications   Medication Sig Start Date End Date Taking? Authorizing Provider  ALPRAZolam [name]) 0.5 MG tablet Take 0.5  mg by mouth 3 (three) times daily. For anxiety    Historical Provider, MD  aspirin EC 81 MG tablet Take 81 mg by mouth daily.    Historical Provider, MD  donepezil (ARICEPT) 10 MG tablet Take 1 tablet (10 mg total) by mouth at bedtime. 04/14/15   [name], NP  furosemide (LASIX) 20 MG tablet Take 20 mg by mouth daily.     Historical Provider, MD  gabapentin (NEURONTIN) 300 MG capsule Take 300 mg by mouth 2 (two) times daily.    Historical Provider, MD  hydrochlorothiazide (HYDRODIURIL) 25 MG tablet Take  25 mg by mouth daily.    Historical Provider, MD  losartan (COZAAR) 100 MG tablet Take 100 mg by mouth daily.    Historical Provider, MD  memantine (NAMENDA) 10 MG tablet Take 1 tablet (10 mg total) by mouth 2 (two) times daily. 04/14/15   [name], NP  Multiple Vitamin (MULTIVITAMIN WITH MINERALS) TABS tablet Take 1 tablet by mouth daily.    Historical Provider, MD  potassium chloride SA (K-DUR,KLOR-CON) 20 MEQ tablet Take 20 mEq by mouth daily.    Historical Provider, MD  simvastatin (ZOCOR) 20 MG tablet Take 20 mg by mouth every evening.    Historical Provider, MD  traMADol-acetaminophen (ULTRACET) 37.5-325 MG per tablet Take 1 tablet by mouth every 6 (six) hours as needed. 04/13/15   Daleen Bo, MD  traZODone (DESYREL) 50 MG tablet Take 1 tablet (50 mg total) by mouth at bedtime. 04/14/15   [name], NP   BP 139/72 mmHg  Pulse 75  Temp(Src) 99.3 F (37.4 C) (Oral)  Resp 16  Wt 185 lb (83.915 kg)  SpO2 98% Physical Exam  Constitutional: She is oriented to person, place, and time. She appears well-developed.  HENT:  Head: Normocephalic.  Eyes: Conjunctivae and EOM are normal. No scleral icterus.  Neck: Neck supple. No tracheal deviation present.  Pulmonary/Chest: No stridor.  Musculoskeletal: Normal range of motion. She exhibits edema.  Incision to ventral surface of right wrist with swelling to right wrist; 2+ radial pulse.   Neurological: She is oriented to person, place, and time. She exhibits normal muscle tone. Coordination normal.  Skin: Skin is warm. No rash noted. No erythema.  Psychiatric: She has a normal mood and affect. Her behavior is normal.    ED Course  Procedures  DIAGNOSTIC STUDIES: Oxygen Saturation is 98% on RA, normal by my interpretation.    COORDINATION OF CARE: 11:33 AM Discussed treatment plan with pt at bedside and pt agreed to plan.   MDM   Final diagnoses:  None   Patient had recent wrist surgery was discharged from hospital today.  She pulled off her splint. Incision looks well. We will put gauze on the wrist. She will also get a Velcro wrist splint. Her sons state that if the patient removed the splint again they will replace it. The chart was scribed for me under my direct supervision.  I personally performed the history, physical, and medical decision making and all procedures in the evaluation of this patient.Milton Ferguson, MD 04/18/15 [phone]

## 2015-04-18 NOTE — ED Notes (Signed)
Pt had right arm surgery yesterday in [place] (by [employer]). Pt got home from recovery this am and took her bandages including her hard cast off. Arm is swollen.

## 2015-09-14 ENCOUNTER — Encounter (HOSPITAL_COMMUNITY): Payer: Self-pay | Admitting: *Deleted

## 2015-09-14 ENCOUNTER — Emergency Department (HOSPITAL_COMMUNITY)
Admission: EM | Admit: 2015-09-14 | Discharge: 2015-09-14 | Disposition: A | Discharge status: Home / Self Care | Payer: Medicare Other | Attending: Emergency Medicine | Admitting: Emergency Medicine

## 2015-09-14 DIAGNOSIS — I2510 Atherosclerotic heart disease of native coronary artery without angina pectoris: Secondary | ICD-10-CM | POA: Insufficient documentation

## 2015-09-14 DIAGNOSIS — I951 Orthostatic hypotension: Secondary | ICD-10-CM | POA: Insufficient documentation

## 2015-09-14 DIAGNOSIS — G309 Alzheimer's disease, unspecified: Secondary | ICD-10-CM | POA: Diagnosis not present

## 2015-09-14 DIAGNOSIS — Z87891 Personal history of nicotine dependence: Secondary | ICD-10-CM | POA: Insufficient documentation

## 2015-09-14 DIAGNOSIS — Z8639 Personal history of other endocrine, nutritional and metabolic disease: Secondary | ICD-10-CM | POA: Diagnosis not present

## 2015-09-14 DIAGNOSIS — Z79899 Other long term (current) drug therapy: Secondary | ICD-10-CM | POA: Insufficient documentation

## 2015-09-14 DIAGNOSIS — F0280 Dementia in other diseases classified elsewhere without behavioral disturbance: Secondary | ICD-10-CM | POA: Insufficient documentation

## 2015-09-14 DIAGNOSIS — E860 Dehydration: Secondary | ICD-10-CM | POA: Insufficient documentation

## 2015-09-14 LAB — URINE MICROSCOPIC-ADD ON

## 2015-09-14 LAB — URINALYSIS, ROUTINE W REFLEX MICROSCOPIC
BILIRUBIN URINE: NEGATIVE
Glucose, UA: NEGATIVE mg/dL
NITRITE: NEGATIVE
PH: 7 (ref 5.0–8.0)
Protein, ur: NEGATIVE mg/dL
SPECIFIC GRAVITY, URINE: 1.02 (ref 1.005–1.030)

## 2015-09-14 LAB — CBC
HEMATOCRIT: 41.3 % (ref 36.0–46.0)
HEMOGLOBIN: 13.7 g/dL (ref 12.0–15.0)
MCH: 30.6 pg (ref 26.0–34.0)
MCHC: 33.2 g/dL (ref 30.0–36.0)
MCV: 92.2 fL (ref 78.0–100.0)
Platelets: 221 10*3/uL (ref 150–400)
RBC: 4.48 MIL/uL (ref 3.87–5.11)
RDW: 12.8 % (ref 11.5–15.5)
WBC: 8 10*3/uL (ref 4.0–10.5)

## 2015-09-14 LAB — COMPREHENSIVE METABOLIC PANEL
ALBUMIN: 3.6 g/dL (ref 3.5–5.0)
ALT: 19 U/L (ref 14–54)
ANION GAP: 8 (ref 5–15)
AST: 23 U/L (ref 15–41)
Alkaline Phosphatase: 67 U/L (ref 38–126)
BILIRUBIN TOTAL: 0.1 mg/dL — AB (ref 0.3–1.2)
BUN: 15 mg/dL (ref 6–20)
CO2: 29 mmol/L (ref 22–32)
Calcium: 9.3 mg/dL (ref 8.9–10.3)
Chloride: 103 mmol/L (ref 101–111)
Creatinine, Ser: 1.07 mg/dL — ABNORMAL HIGH (ref 0.44–1.00)
GFR calc non Af Amer: 47 mL/min — ABNORMAL LOW (ref >60)
GFR, EST AFRICAN AMERICAN: 54 mL/min — AB (ref >60)
GLUCOSE: 119 mg/dL — AB (ref 65–99)
POTASSIUM: 3.5 mmol/L (ref 3.5–5.1)
SODIUM: 140 mmol/L (ref 135–145)
TOTAL PROTEIN: 7.1 g/dL (ref 6.5–8.1)

## 2015-09-14 LAB — TROPONIN I

## 2015-09-14 LAB — LIPASE, BLOOD: Lipase: 33 U/L (ref 11–51)

## 2015-09-14 MED ORDER — SODIUM CHLORIDE 0.9 % IV BOLUS (SEPSIS)
1000.0000 mL | Freq: Once | INTRAVENOUS | Status: AC
  Administered 2015-09-14: 1000 mL via INTRAVENOUS

## 2015-09-14 NOTE — ED Provider Notes (Signed)
CSN: [id number]     Arrival date & time 09/14/15  1140 History   First MD Initiated Contact with Patient 09/14/15 1352     Chief Complaint  Patient presents with  . Dehydration     (Consider location/radiation/quality/duration/timing/severity/associated sxs/prior Treatment) HPI Comments: Level V caveat for dementia. Patient brought in by daughter-in-law believes she is dehydrated. States she's been eating fine but not drinking water or any fluids over the past 3 days. She had 2 episodes of emesis 3 nights ago but none since. Daughter-in-law states patient has a dry mouth, dark urine and poor intake of fluids. She denies any pain. She denies any nausea. She denies any diarrhea or constipation. Daughter states she is eating food fine. No chest pain or shortness of breath. No fever. No cough or runny nose. She denies any dizziness or lightheadedness. She is at her baseline level of confusion. Denies any history of heart problems or lung problems. No history of CHF.  The history is provided by the patient. The history is limited by the condition of the patient.    Past Medical History  Diagnosis Date  . Hypertension   . High cholesterol   . Dementia   . Coronary artery disease   . Alzheimer's disease 09/11/2014   Past Surgical History  Procedure Laterality Date  . Cholecystectomy    . Colonoscopy  09/28/2011    Procedure: COLONOSCOPY;  Surgeon: [name], MD;  Location: [facility];  Service: Endoscopy;  Laterality: N/A;  1200  . Cataract extraction w/phaco  12/13/2011    Procedure: CATARACT EXTRACTION PHACO AND INTRAOCULAR LENS PLACEMENT (IOC);  Surgeon: [name], MD;  Location: [facility];  Service: Ophthalmology;  Laterality: Right;  CDE 12.67  . Cataract extraction w/phaco  03/06/2012    Procedure: CATARACT EXTRACTION PHACO AND INTRAOCULAR LENS PLACEMENT (IOC);  Surgeon: [name], MD;  Location: [facility];  Service: Ophthalmology;  Laterality: Left;  CDE:13.12   Family  History  Problem Relation Age of Onset  . Dementia Sister   . Dementia Brother    Social History  Substance Use Topics  . Smoking status: Former Smoker -- 0.25 packs/day for 10 years    Quit date: 07/18/2007  . Smokeless tobacco: Never Used  . Alcohol Use: No   OB History    No data available     Review of Systems  Unable to perform ROS: Dementia      Allergies  Review of patient's allergies indicates no known allergies.  Home Medications   Prior to Admission medications   Medication Sig Start Date End Date Taking? Authorizing Provider  ALPRAZolam [name]) 0.5 MG tablet Take 0.5 mg by mouth 3 (three) times daily. For anxiety   Yes Historical Provider, MD  donepezil (ARICEPT) 10 MG tablet Take 1 tablet (10 mg total) by mouth at bedtime. 04/14/15  Yes [name], NP  furosemide (LASIX) 20 MG tablet Take 20 mg by mouth daily.    Yes Historical Provider, MD  hydrochlorothiazide (HYDRODIURIL) 25 MG tablet Take 25 mg by mouth daily.   Yes Historical Provider, MD  losartan (COZAAR) 100 MG tablet Take 100 mg by mouth daily.   Yes Historical Provider, MD  memantine (NAMENDA) 10 MG tablet Take 1 tablet (10 mg total) by mouth 2 (two) times daily. 04/14/15  Yes [name], NP  Multiple Vitamin (MULTIVITAMIN WITH MINERALS) TABS tablet Take 1 tablet by mouth daily.   Yes Historical Provider, MD  potassium chloride SA (K-DUR,KLOR-CON)  20 MEQ tablet Take 20 mEq by mouth daily.   Yes Historical Provider, MD  traMADol (ULTRAM) 50 MG tablet Take 50 mg by mouth every 6 (six) hours as needed.   Yes Historical Provider, MD   BP 122/70 mmHg  Pulse 62  Temp(Src) 98.1 F (36.7 C) (Oral)  Resp 16  Ht 5\' 7"  (1.702 m)  Wt 185 lb (83.915 kg)  BMI 28.97 kg/m2  SpO2 99% Physical Exam  Constitutional: She appears well-developed and well-nourished. No distress.  HENT:  Head: Normocephalic and atraumatic.  Mouth/Throat: Oropharynx is clear and moist. No oropharyngeal exudate.  Mildly dry  mucus membranes  Eyes: Conjunctivae and EOM are normal. Pupils are equal, round, and reactive to light.  Neck: Normal range of motion. Neck supple.  No meningismus.  Cardiovascular: Normal rate, regular rhythm, normal heart sounds and intact distal pulses.   No murmur heard. Pulmonary/Chest: Effort normal and breath sounds normal. No respiratory distress.  Abdominal: Soft. There is no tenderness. There is no rebound and no guarding.  Musculoskeletal: Normal range of motion. She exhibits no edema or tenderness.  Neurological: She is alert. No cranial nerve deficit. She exhibits normal muscle tone. Coordination normal.  Oriented x2. No ataxia on finger to nose bilaterally. No pronator drift. 5/5 strength throughout. CN 2-12 intact.Equal grip strength. Sensation intact.   Skin: Skin is warm.  Psychiatric: She has a normal mood and affect. Her behavior is normal.  Nursing note and vitals reviewed.   ED Course  Procedures (including critical care time) Labs Review Labs Reviewed  COMPREHENSIVE METABOLIC PANEL - Abnormal; Notable for the following:    Glucose, Bld 119 (*)    Creatinine, Ser 1.07 (*)    Total Bilirubin 0.1 (*)    GFR calc non Af Amer 47 (*)    GFR calc Af Amer 54 (*)    All other components within normal limits  URINALYSIS, ROUTINE W REFLEX MICROSCOPIC (NOT AT [facility]) - Abnormal; Notable for the following:    Hgb urine dipstick TRACE (*)    Ketones, ur TRACE (*)    Leukocytes, UA TRACE (*)    All other components within normal limits  URINE MICROSCOPIC-ADD ON - Abnormal; Notable for the following:    Squamous Epithelial / LPF TOO NUMEROUS TO COUNT (*)    Bacteria, UA MANY (*)    All other components within normal limits  URINE CULTURE  LIPASE, BLOOD  CBC  TROPONIN I    Imaging Review No results found. I have personally reviewed and evaluated these images and lab results as part of my medical decision-making.   EKG Interpretation   Date/Time:  Monday September 14 2015 15:46:13 EST Ventricular Rate:  59 PR Interval:  229 QRS Duration: 76 QT Interval:  546 QTC Calculation: 541 R Axis:   -48 Text Interpretation:  Sinus rhythm Prolonged PR interval Left anterior  fascicular block Borderline T abnormalities, anterior leads Prolonged QT  interval Nonspecific T wave abnormality No significant change was found  Confirmed by Henderson [phone]) on 09/14/2015 3:53:13 PM      MDM   Final diagnoses:  Orthostasis  Dehydration   Patient from home with poor fluid intake over the past several days. Denies any symptoms. no further vomiting. No chest pain or shortness of breath. no abdominal pain.  Orthostatics are positive with elevated heart rate with standing. We'll give IV fluids.  Labs are reassuring. electrolytes normal. creatinine normal. Urinalysis contaminated with skin cells. Will send  culture. EKG with stable nonspecific T-wave inversions. Troponin negative. No chest pain or shortness of breath.  Patient tolerating by mouth and ambulatory. Denies any dizziness. Continue PO hydration at home. Follow up with PCP. Will be called if urine culture positive. Return precautions discussed.  Blood pressure 122/70, pulse 62, temperature 98.1 F (36.7 C), temperature source Oral, resp. rate 16, height 5\' 7"  (1.702 m), weight 185 lb (83.915 kg), SpO2 99 %.   [name], MD 09/15/15 [phone]

## 2015-09-14 NOTE — ED Notes (Signed)
Pt comes in with daughter in law who believes she is dehydrated. Pt had 2 episodes of emesis on Friday with no emesis since then. Family states she has had dry mouth, dark yellow urine, and poor intake of fluids. Pt denies any pain.

## 2015-09-14 NOTE — ED Notes (Signed)
Ambulated with minimal assistance.  Tolerating sprite.

## 2015-09-14 NOTE — Discharge Instructions (Signed)
Dehydration, Adult Keep yourself hydrated. Follow up with your doctor. Return to the ED if you develop new worsening symptoms.  Dehydration is a condition in which you do not have enough fluid or water in your body. It happens when you take in less fluid than you lose. Vital organs such as the kidneys, brain, and heart cannot function without a proper amount of fluids. Any loss of fluids from the body can cause dehydration.  Dehydration can range from mild to severe. This condition should be treated right away to help prevent it from becoming severe. CAUSES  This condition may be caused by:  Vomiting.  Diarrhea.  Excessive sweating, such as when exercising in hot or humid weather.  Not drinking enough fluid during strenuous exercise or during an illness.  Excessive urine output.  Fever.  Certain medicines. RISK FACTORS This condition is more likely to develop in:  People who are taking certain medicines that cause the body to lose excess fluid (diuretics).   People who have a chronic illness, such as diabetes, that may increase urination.  Older adults.   People who live at high altitudes.   People who participate in endurance sports.  SYMPTOMS  Mild Dehydration  Thirst.  Dry lips.  Slightly dry mouth.  Dry, warm skin. Moderate Dehydration  Very dry mouth.   Muscle cramps.   Dark urine and decreased urine production.   Decreased tear production.   Headache.   Light-headedness, especially when you stand up from a sitting position.  Severe Dehydration  Changes in skin.   Cold and clammy skin.   Skin does not spring back quickly when lightly pinched and released.   Changes in body fluids.   Extreme thirst.   No tears.   Not able to sweat when body temperature is high, such as in hot weather.   Minimal urine production.   Changes in vital signs.   Rapid, weak pulse (more than 100 beats per minute when you are sitting still).    Rapid breathing.   Low blood pressure.   Other changes.   Sunken eyes.   Cold hands and feet.   Confusion.  Lethargy and difficulty being awakened.  Fainting (syncope).   Short-term weight loss.   Unconsciousness. DIAGNOSIS  This condition may be diagnosed based on your symptoms. You may also have tests to determine how severe your dehydration is. These tests may include:   Urine tests.   Blood tests.  TREATMENT  Treatment for this condition depends on the severity. Mild or moderate dehydration can often be treated at home. Treatment should be started right away. Do not wait until dehydration becomes severe. Severe dehydration needs to be treated at the hospital. Treatment for Mild Dehydration  Drinking plenty of water to replace the fluid you have lost.   Replacing minerals in your blood (electrolytes) that you may have lost.  Treatment for Moderate Dehydration  Consuming oral rehydration solution (ORS). Treatment for Severe Dehydration  Receiving fluid through an IV tube.   Receiving electrolyte solution through a feeding tube that is passed through your nose and into your stomach (nasogastric tube or NG tube).  Correcting any abnormalities in electrolytes. HOME CARE INSTRUCTIONS   Drink enough fluid to keep your urine clear or pale yellow.   Drink water or fluid slowly by taking small sips. You can also try sucking on ice cubes.  Have food or beverages that contain electrolytes. Examples include bananas and sports drinks.  Take over-the-counter and prescription medicines  only as told by your health care provider.   Prepare ORS according to the manufacturer's instructions. Take sips of ORS every 5 minutes until your urine returns to normal.  If you have vomiting or diarrhea, continue to try to drink water, ORS, or both.   If you have diarrhea, avoid:   Beverages that contain caffeine.   Fruit juice.   Milk.   Carbonated soft  drinks.  Do not take salt tablets. This can lead to the condition of having too much sodium in your body (hypernatremia).  SEEK MEDICAL CARE IF:  You cannot eat or drink without vomiting.  You have had moderate diarrhea during a period of more than 24 hours.  You have a fever. SEEK IMMEDIATE MEDICAL CARE IF:   You have extreme thirst.  You have severe diarrhea.  You have not urinated in 6-8 hours, or you have urinated only a small amount of very dark urine.  You have shriveled skin.  You are dizzy, confused, or both.   This information is not intended to replace advice given to you by your health care provider. Make sure you discuss any questions you have with your health care provider.   Document Released: 07/04/2005 Document Revised: 03/25/2015 Document Reviewed: 11/19/2014 Elsevier Interactive Patient Education [employer].

## 2015-09-16 LAB — URINE CULTURE

## 2015-10-12 ENCOUNTER — Encounter: Payer: Self-pay | Admitting: Adult Health

## 2015-10-12 ENCOUNTER — Ambulatory Visit (INDEPENDENT_AMBULATORY_CARE_PROVIDER_SITE_OTHER): Payer: Medicare Other | Admitting: Adult Health

## 2015-10-12 VITALS — BP 127/76 | HR 81 | Resp 16 | Ht 62.0 in | Wt 165.0 lb

## 2015-10-12 DIAGNOSIS — F0280 Dementia in other diseases classified elsewhere without behavioral disturbance: Secondary | ICD-10-CM | POA: Diagnosis not present

## 2015-10-12 DIAGNOSIS — G308 Other Alzheimer's disease: Secondary | ICD-10-CM | POA: Diagnosis not present

## 2015-10-12 MED ORDER — MEMANTINE HCL 10 MG PO TABS: 10.0000 mg | ORAL_TABLET | Freq: Two times a day (BID) | ORAL | Status: AC

## 2015-10-12 MED ORDER — DONEPEZIL HCL 10 MG PO TABS: 10.0000 mg | ORAL_TABLET | Freq: Every day | ORAL | Status: AC

## 2015-10-12 NOTE — Progress Notes (Signed)
PATIENT: [name] DOB: [birth year]  REASON FOR VISIT: follow up- Alzheimer's disease HISTORY FROM: patient  HISTORY OF PRESENT ILLNESS: [name] is an 82 year old female with a history of Alzheimer's disease. She returns today for follow-up. She continues on Aricept and Namenda and is tolerating both medications well. She feels that her memory has remained stable. She is able to complete all ADLs independently. She lives with her son. Her family helps her with her meals. She does not operate a motor vehicle. She uses xanax at night to help with her sleep/amxiety. She no longer uses trazodone. She denies any hallucinations or delusional thinking. Denies tremor. Denies any changes with gait or balance. She returns today for an evaluation.  Update 04/14/15 (MM): [name] is an 82 year old female with a history of Alzheimer's disease. She returns today for follow-up. She is currently taking Aricept and Namenda and tolerating both medications well. She lives at home with her son. She is able to complete all ADLs independently. She no longer operates a motor vehicle. She does not prepare her own meals. Her children help her with this. The patient recently had a fall and broke her right arm. She will be having surgery this Friday. She currently uses trazodone to help her sleep at night. She returns today for an evaluation.  HISTORY 09/11/14: [name] is an 82 year old right-handed black female with Alzheimer's disease. She has had memory issues for least 7 to 8 years, originally diagnosed in 2006, according to family members. She lives at home with her husband, and family members help out at times. The patient is able to bathe herself, dress herself, and feed herself. She requires assistance with keeping up with medications, she does not cook, do the finances, or operate a motor vehicle. The patient sleeps well at night, and she has a good energy level. She denies any focal numbness or  weakness of the face, arms, or legs. She does have some mild balance issues and she will fall on occasion. She denies any issues controlling the bowels or the bladder. A CT scan of the brain was done approximately one year ago, and this did not show any evidence of hydrocephalus or significant cerebrovascular disease. The patient is on Namenda and Aricept, and she is tolerating the medications well. The family has asked for second opinion regarding her memory issues. A brother and a sister also had memory issues.  REVIEW OF SYSTEMS: Out of a complete 14 system review of symptoms, the patient complains only of the following symptoms, and all other reviewed systems are negative.  See hpi  ALLERGIES: No Known Allergies  HOME MEDICATIONS: Outpatient Prescriptions Prior to Visit  Medication Sig Dispense Refill  . ALPRAZolam (XANAX) 0.5 MG tablet Take 0.5 mg by mouth 3 (three) times daily. For anxiety    . donepezil (ARICEPT) 10 MG tablet Take 1 tablet (10 mg total) by mouth at bedtime. 30 tablet 6  . hydrochlorothiazide (HYDRODIURIL) 25 MG tablet Take 25 mg by mouth daily.    Marland Kitchen losartan (COZAAR) 100 MG tablet Take 100 mg by mouth daily.    . memantine (NAMENDA) 10 MG tablet Take 1 tablet (10 mg total) by mouth 2 (two) times daily. 60 tablet 6  . Multiple Vitamin (MULTIVITAMIN WITH MINERALS) TABS tablet Take 1 tablet by mouth daily.    . traMADol (ULTRAM) 50 MG tablet Take 50 mg by mouth every 6 (six) hours as needed.    . potassium chloride SA (K-DUR,KLOR-CON) 20  MEQ tablet Take 20 mEq by mouth daily. Reported on 10/12/2015    . furosemide (LASIX) 20 MG tablet Take 20 mg by mouth daily.      No facility-administered medications prior to visit.    PAST MEDICAL HISTORY: Past Medical History  Diagnosis Date  . Hypertension   . High cholesterol   . Dementia   . Coronary artery disease   . Alzheimer's disease 09/11/2014    PAST SURGICAL HISTORY: Past Surgical History  Procedure Laterality  Date  . Cholecystectomy    . Colonoscopy  09/28/2011    Procedure: COLONOSCOPY;  Surgeon: [name], MD;  [facility];  Service: Endoscopy;  Laterality: N/A;  1200  . Cataract extraction w/phaco  12/13/2011    Procedure: CATARACT EXTRACTION PHACO AND INTRAOCULAR LENS PLACEMENT (IOC);  Surgeon: [name], MD;  Location: [facility];  Service: Ophthalmology;  Laterality: Right;  CDE 12.67  . Cataract extraction w/phaco  03/06/2012    Procedure: CATARACT EXTRACTION PHACO AND INTRAOCULAR LENS PLACEMENT (IOC);  Surgeon: [name], MD;  Location: [facility];  Service: Ophthalmology;  Laterality: Left;  CDE:13.12    FAMILY HISTORY: Family History  Problem Relation Age of Onset  . Dementia Sister   . Dementia Brother     SOCIAL HISTORY: Social History   Social History  . Marital Status: Widowed    Spouse Name: N/A  . Number of Children: 6  . Years of Education: N/A   Occupational History  . Not on file.   Social History Main Topics  . Smoking status: Former Smoker -- 0.25 packs/day for 10 years    Quit date: 07/18/2007  . Smokeless tobacco: Never Used  . Alcohol Use: No  . Drug Use: No  . Sexual Activity: Not on file   Other Topics Concern  . Not on file   Social History Narrative   Patient lives at home with her son Cathleen Corti. Her son and Nielle Mcrobie lives out of town.   And her sister in law is with her today ([place].) [place].    Retired.   Education 6th grade.   Right handed.   Caffeine sometimes.       PHYSICAL EXAM  Filed Vitals:   10/12/15 1129  BP: 127/76  Pulse: 81  Resp: 16  Height: 5\' 2"  (1.575 m)  Weight: 165 lb (74.844 kg)   Body mass index is 30.17 kg/(m^2).   MMSE - Mini Mental State Exam 10/12/2015 04/14/2015 09/11/2014  Orientation to time 0 3 0  Orientation to Place 2 3 4   Registration 3 0 2  Attention/ Calculation 0 0 0  Recall 0 3 0  Language- name 2 objects 2 1 1   Language- repeat 1  1 1   Language- follow 3 step command 2 1 3   Language- read & follow direction 1 1 1   Write a sentence 0 0 1  Copy design 0 0 0  Total score 11 13 13      Generalized: Well developed, in no acute distress   Neurological examination  Mentation: Alert. Patient has a hard time comprehending directions. speech and language fluent Cranial nerve II-XII: Pupils were equal round reactive to light. Extraocular movements were full, visual field were full on confrontational test. Facial sensation and strength were normal. Uvula tongue midline. Head turning and shoulder shrug  were normal and symmetric. Motor: The motor testing reveals 5 over 5 strength of all 4 extremities.  Good symmetric motor tone is noted throughout.  Sensory: Sensory testing is intact to soft touch on all 4 extremities. No evidence of extinction is noted.  Coordination: Difficulty understanding directions.  Gait and station: Patient's gait is slightly unsteady. Tandem gait not attempted. Reflexes: Deep tendon reflexes are symmetric and normal bilaterally.   DIAGNOSTIC DATA (LABS, IMAGING, TESTING) - I reviewed patient records, labs, notes, testing and imaging myself where available.  Lab Results  Component Value Date   WBC 8.0 09/14/2015   HGB 13.7 09/14/2015   HCT 41.3 09/14/2015   MCV 92.2 09/14/2015   PLT 221 09/14/2015      Component Value Date/Time   NA 140 09/14/2015 1253   K 3.5 09/14/2015 1253   CL 103 09/14/2015 1253   CO2 29 09/14/2015 1253   GLUCOSE 119* 09/14/2015 1253   BUN 15 09/14/2015 1253   CREATININE 1.07* 09/14/2015 1253   CALCIUM 9.3 09/14/2015 1253   PROT 7.1 09/14/2015 1253   ALBUMIN 3.6 09/14/2015 1253   AST 23 09/14/2015 1253   ALT 19 09/14/2015 1253   ALKPHOS 67 09/14/2015 1253   BILITOT 0.1* 09/14/2015 1253   GFRNONAA 47* 09/14/2015 1253   GFRAA 54* 09/14/2015 1253   No results found for: CHOL, HDL, LDLCALC, LDLDIRECT, TRIG, CHOLHDL No results found for: HGBA1C Lab Results    Component Value Date   VITAMINB12 556 09/11/2014   Lab Results  Component Value Date   TSH 0.369 05/24/2012      ASSESSMENT AND PLAN 82 y.o. year old female  has a past medical history of Hypertension; High cholesterol; Dementia; Coronary artery disease; and Alzheimer's disease (09/11/2014). here with:  1. Alzheimer's disease  Overall the patientHas remained stable. Marland Kitchen MMSE today is 11/30 was previously 13/30. She will remain on Aricept and Namenda. Patient advised that if her symptoms worsen or she develops any new symptoms she should let us know. She will follow-up in 6 months or sooner if needed.     [name], MSN, NP-C 10/12/2015, 11:36 AM [facility] [address] [place], [place] [address] [phone]

## 2015-10-12 NOTE — Progress Notes (Signed)
I have read the note, and I agree with the clinical assessment and plan.  [name],[name]   

## 2015-10-12 NOTE — Patient Instructions (Signed)
Continue Namenda and Aricept If your symptoms worsen or you develop new symptoms please let us know.   

## 2015-10-14 ENCOUNTER — Telehealth: Payer: Self-pay | Admitting: Adult Health

## 2015-10-14 MED ORDER — TRAZODONE HCL 50 MG PO TABS: 25.0000 mg | ORAL_TABLET | Freq: Every day | ORAL | Status: DC

## 2015-10-14 NOTE — Telephone Encounter (Signed)
The patient is already on Namenda and Aricept. No additional medication to add for memory at this time. Short term memory loss is common with memory disorder such as Alzheimer's.

## 2015-10-14 NOTE — Telephone Encounter (Signed)
[name] [phone] called requesting to speak to Jinny Blossom, to advise patient's "short term memory is completely gone", feels like she and husband need more help, patient's son was here at the recent visit and he 37 she's the same and nothing has changed, however Virginia and husband see the changes, ie: she can just finish eating a meal, go to the bathroom and come back asking "when are we going to eat". Virginia also wants to know, how expensive memantine (NAMENDA) 10 MG tablet is?

## 2015-10-14 NOTE — Telephone Encounter (Signed)
I called and spoke to the [place] and her husband. I will order Trazodone 25 mg at bedtime to help with sleep. The patient is very forgetful during the day. I do not think they she needs anything for agitation. There may be caregiver burnout I recommended possible resources.

## 2015-10-14 NOTE — Telephone Encounter (Signed)
I spoke to [facility], she is stating that the patient gets agitated and wants to eat all the time. She wants to know if there is something else to give her to calm her down. She is currently taking Xanax but it does not seem to be helping to calm her down.

## 2015-10-14 NOTE — Telephone Encounter (Signed)
Please Advise

## 2016-04-14 ENCOUNTER — Ambulatory Visit: Payer: Medicare Other | Admitting: Adult Health

## 2016-04-18 ENCOUNTER — Encounter: Payer: Self-pay | Admitting: Adult Health

## 2016-04-25 ENCOUNTER — Inpatient Hospital Stay (HOSPITAL_COMMUNITY)
Admission: EM | Admit: 2016-04-25 | Discharge: 2016-04-27 | DRG: 684 | Disposition: A | Discharge status: Home Health | Payer: Medicare Other | Attending: Internal Medicine | Admitting: Internal Medicine

## 2016-04-25 ENCOUNTER — Emergency Department (HOSPITAL_COMMUNITY): Payer: Medicare Other

## 2016-04-25 ENCOUNTER — Encounter (HOSPITAL_COMMUNITY): Payer: Self-pay | Admitting: Emergency Medicine

## 2016-04-25 DIAGNOSIS — Z66 Do not resuscitate: Secondary | ICD-10-CM | POA: Diagnosis present

## 2016-04-25 DIAGNOSIS — E7800 Pure hypercholesterolemia, unspecified: Secondary | ICD-10-CM | POA: Diagnosis present

## 2016-04-25 DIAGNOSIS — R197 Diarrhea, unspecified: Secondary | ICD-10-CM | POA: Diagnosis present

## 2016-04-25 DIAGNOSIS — E785 Hyperlipidemia, unspecified: Secondary | ICD-10-CM | POA: Diagnosis present

## 2016-04-25 DIAGNOSIS — I959 Hypotension, unspecified: Secondary | ICD-10-CM | POA: Diagnosis present

## 2016-04-25 DIAGNOSIS — F0280 Dementia in other diseases classified elsewhere without behavioral disturbance: Secondary | ICD-10-CM | POA: Diagnosis present

## 2016-04-25 DIAGNOSIS — M6281 Muscle weakness (generalized): Secondary | ICD-10-CM

## 2016-04-25 DIAGNOSIS — R001 Bradycardia, unspecified: Secondary | ICD-10-CM | POA: Diagnosis present

## 2016-04-25 DIAGNOSIS — E860 Dehydration: Secondary | ICD-10-CM | POA: Diagnosis present

## 2016-04-25 DIAGNOSIS — Z23 Encounter for immunization: Secondary | ICD-10-CM

## 2016-04-25 DIAGNOSIS — G309 Alzheimer's disease, unspecified: Secondary | ICD-10-CM | POA: Diagnosis present

## 2016-04-25 DIAGNOSIS — I9589 Other hypotension: Secondary | ICD-10-CM | POA: Diagnosis not present

## 2016-04-25 DIAGNOSIS — N179 Acute kidney failure, unspecified: Secondary | ICD-10-CM | POA: Diagnosis not present

## 2016-04-25 LAB — CBC WITH DIFFERENTIAL/PLATELET
BASOS PCT: 0 %
Basophils Absolute: 0 10*3/uL (ref 0.0–0.1)
EOS ABS: 0 10*3/uL (ref 0.0–0.7)
Eosinophils Relative: 0 %
HCT: 36.8 % (ref 36.0–46.0)
Hemoglobin: 12.3 g/dL (ref 12.0–15.0)
Lymphocytes Relative: 14 %
Lymphs Abs: 1.4 10*3/uL (ref 0.7–4.0)
MCH: 30.9 pg (ref 26.0–34.0)
MCHC: 33.4 g/dL (ref 30.0–36.0)
MCV: 92.5 fL (ref 78.0–100.0)
MONO ABS: 0.6 10*3/uL (ref 0.1–1.0)
Monocytes Relative: 6 %
NEUTROS PCT: 80 %
Neutro Abs: 7.9 10*3/uL — ABNORMAL HIGH (ref 1.7–7.7)
Platelets: 171 10*3/uL (ref 150–400)
RBC: 3.98 MIL/uL (ref 3.87–5.11)
RDW: 13 % (ref 11.5–15.5)
WBC: 9.9 10*3/uL (ref 4.0–10.5)

## 2016-04-25 LAB — COMPREHENSIVE METABOLIC PANEL
ALK PHOS: 70 U/L (ref 38–126)
ALT: 10 U/L — AB (ref 14–54)
AST: 15 U/L (ref 15–41)
Albumin: 3.6 g/dL (ref 3.5–5.0)
Anion gap: 6 (ref 5–15)
BILIRUBIN TOTAL: 0.4 mg/dL (ref 0.3–1.2)
BUN: 34 mg/dL — ABNORMAL HIGH (ref 6–20)
CALCIUM: 9.5 mg/dL (ref 8.9–10.3)
CO2: 29 mmol/L (ref 22–32)
CREATININE: 2.4 mg/dL — AB (ref 0.44–1.00)
Chloride: 101 mmol/L (ref 101–111)
GFR calc non Af Amer: 18 mL/min — ABNORMAL LOW (ref >60)
GFR, EST AFRICAN AMERICAN: 20 mL/min — AB (ref >60)
GLUCOSE: 117 mg/dL — AB (ref 65–99)
Potassium: 4.4 mmol/L (ref 3.5–5.1)
SODIUM: 136 mmol/L (ref 135–145)
TOTAL PROTEIN: 6.8 g/dL (ref 6.5–8.1)

## 2016-04-25 LAB — LACTIC ACID, PLASMA
Lactic Acid, Venous: 1.7 mmol/L (ref 0.5–1.9)
Lactic Acid, Venous: 1.1 mmol/L (ref 0.5–1.9)

## 2016-04-25 LAB — I-STAT CG4 LACTIC ACID, ED: Lactic Acid, Venous: 1.46 mmol/L (ref 0.5–1.9)

## 2016-04-25 LAB — MRSA PCR SCREENING: MRSA BY PCR: NEGATIVE

## 2016-04-25 MED ORDER — SODIUM CHLORIDE 0.9 % IV BOLUS (SEPSIS)
1000.0000 mL | Freq: Once | INTRAVENOUS | Status: AC
  Administered 2016-04-25: 1000 mL via INTRAVENOUS

## 2016-04-25 MED ORDER — INFLUENZA VAC SPLIT QUAD 0.5 ML IM SUSY
0.5000 mL | PREFILLED_SYRINGE | INTRAMUSCULAR | Status: AC
  Administered 2016-04-26: 0.5 mL via INTRAMUSCULAR
  Filled 2016-04-25: qty 0.5

## 2016-04-25 MED ORDER — PIPERACILLIN-TAZOBACTAM 3.375 G IVPB 30 MIN
3.3750 g | Freq: Once | INTRAVENOUS | Status: AC
  Administered 2016-04-25: 3.375 g via INTRAVENOUS
  Filled 2016-04-25: qty 50

## 2016-04-25 MED ORDER — SODIUM CHLORIDE 0.9 % IV BOLUS (SEPSIS)
500.0000 mL | Freq: Once | INTRAVENOUS | Status: AC
  Administered 2016-04-25: 500 mL via INTRAVENOUS

## 2016-04-25 MED ORDER — SODIUM CHLORIDE 0.9 % IV BOLUS (SEPSIS): 250.0000 mL | Freq: Once | INTRAVENOUS | Status: DC

## 2016-04-25 MED ORDER — DONEPEZIL HCL 5 MG PO TABS
10.0000 mg | ORAL_TABLET | Freq: Every day | ORAL | Status: DC
  Administered 2016-04-25 – 2016-04-26 (×2): 10 mg via ORAL
  Filled 2016-04-25 (×2): qty 2

## 2016-04-25 MED ORDER — ONDANSETRON HCL 4 MG/2ML IJ SOLN: 4.0000 mg | Freq: Four times a day (QID) | INTRAMUSCULAR | Status: DC | PRN

## 2016-04-25 MED ORDER — TRAMADOL HCL 50 MG PO TABS
50.0000 mg | ORAL_TABLET | Freq: Four times a day (QID) | ORAL | Status: DC | PRN
  Administered 2016-04-25: 50 mg via ORAL
  Filled 2016-04-25: qty 1

## 2016-04-25 MED ORDER — PNEUMOCOCCAL VAC POLYVALENT 25 MCG/0.5ML IJ INJ
0.5000 mL | INJECTION | INTRAMUSCULAR | Status: AC
  Administered 2016-04-26: 0.5 mL via INTRAMUSCULAR
  Filled 2016-04-25: qty 0.5

## 2016-04-25 MED ORDER — SODIUM CHLORIDE 0.9 % IV SOLN: INTRAVENOUS | Status: DC

## 2016-04-25 MED ORDER — ACETAMINOPHEN 325 MG PO TABS: 650.0000 mg | ORAL_TABLET | Freq: Four times a day (QID) | ORAL | Status: DC | PRN

## 2016-04-25 MED ORDER — ACETAMINOPHEN 650 MG RE SUPP: 650.0000 mg | Freq: Four times a day (QID) | RECTAL | Status: DC | PRN

## 2016-04-25 MED ORDER — TRAZODONE HCL 50 MG PO TABS
25.0000 mg | ORAL_TABLET | Freq: Every day | ORAL | Status: DC
  Administered 2016-04-25 – 2016-04-26 (×2): 25 mg via ORAL
  Filled 2016-04-25 (×2): qty 1

## 2016-04-25 MED ORDER — ALPRAZOLAM 0.5 MG PO TABS
0.5000 mg | ORAL_TABLET | Freq: Three times a day (TID) | ORAL | Status: DC
  Administered 2016-04-25 – 2016-04-27 (×6): 0.5 mg via ORAL
  Filled 2016-04-25 (×6): qty 1

## 2016-04-25 MED ORDER — ONDANSETRON HCL 4 MG PO TABS: 4.0000 mg | ORAL_TABLET | Freq: Four times a day (QID) | ORAL | Status: DC | PRN

## 2016-04-25 MED ORDER — HEPARIN SODIUM (PORCINE) 5000 UNIT/ML IJ SOLN
5000.0000 [IU] | Freq: Three times a day (TID) | INTRAMUSCULAR | Status: DC
  Administered 2016-04-25 – 2016-04-27 (×6): 5000 [IU] via SUBCUTANEOUS
  Filled 2016-04-25 (×5): qty 1

## 2016-04-25 MED ORDER — SIMVASTATIN 20 MG PO TABS
20.0000 mg | ORAL_TABLET | Freq: Every day | ORAL | Status: DC
  Administered 2016-04-25 – 2016-04-27 (×3): 20 mg via ORAL
  Filled 2016-04-25 (×3): qty 1

## 2016-04-25 MED ORDER — SODIUM CHLORIDE 0.9 % IV SOLN
INTRAVENOUS | Status: DC
  Administered 2016-04-25 – 2016-04-27 (×4): via INTRAVENOUS

## 2016-04-25 MED ORDER — MEMANTINE HCL 10 MG PO TABS
10.0000 mg | ORAL_TABLET | Freq: Two times a day (BID) | ORAL | Status: DC
  Administered 2016-04-25 – 2016-04-27 (×4): 10 mg via ORAL
  Filled 2016-04-25 (×4): qty 1

## 2016-04-25 NOTE — H&P (Signed)
History and Physical    [name] [id number] DOB: 26-Oct-1929 DOA: 04/25/2016  PCP: [name], MD  Patient coming from: [facility] in [place] Complaint: diarrhea  HPI: [name] is a 82 y.o. female with medical history significant of dementia, hypertension, hyperlipidemia is brought to the hospital with diarrhea and hypotension. Patient has significant dementia and cannot contribute to history. History is obtained from her family member which is also limited. According to family member, she was told by facility. For the past 24 hours patient has been having persistent diarrhea. Her blood pressure was checked this morning was noted to be low which prompted ER referral. She is not aware of any fevers. No other residents of the facility have had similar symptoms. Her family does report the patient had antibiotics for UTI within the past 2 months. No vomiting, shortness of breath, cough. Her by mouth intake has been normal for her.  ED Course: In ED she was noted to be significantly hypotensive with blood pressures in the 70s. Labs indicated acute renal failure. She was given IV fluids in the emergency room with improvement of her blood pressure. She's been referred for admission.  Review of Systems: As per HPI otherwise 10 point review of systems negative.    Past Medical History:  Diagnosis Date  . Alzheimer's disease 09/11/2014  . Coronary artery disease   . Dementia   . High cholesterol   . Hypertension     Past Surgical History:  Procedure Laterality Date  . CATARACT EXTRACTION W/PHACO  12/13/2011   Procedure: CATARACT EXTRACTION PHACO AND INTRAOCULAR LENS PLACEMENT (IOC);  Surgeon: [name], MD;  Location: [facility];  Service: Ophthalmology;  Laterality: Right;  CDE 12.67  . CATARACT EXTRACTION W/PHACO  03/06/2012   Procedure: CATARACT EXTRACTION PHACO AND INTRAOCULAR LENS PLACEMENT (IOC);  Surgeon: [name], MD;  Location: [facility];  Service:  Ophthalmology;  Laterality: Left;  CDE:13.12  . CHOLECYSTECTOMY    . COLONOSCOPY  09/28/2011   Procedure: COLONOSCOPY;  Surgeon: [name], MD;  [facility];  Service: Endoscopy;  Laterality: N/A;  1200     reports that she quit smoking about 8 years ago. She has a 2.50 pack-year smoking history. She has never used smokeless tobacco. She reports that she does not drink alcohol or use drugs.  No Known Allergies  Family History  Problem Relation Age of Onset  . Dementia Sister   . Dementia Brother      Prior to Admission medications   Medication Sig Start Date End Date Taking? Authorizing Provider  acetaminophen (TYLENOL) 325 MG tablet Take 650 mg by mouth every 6 (six) hours as needed.   Yes Historical Provider, MD  ALPRAZolam [name]) 0.5 MG tablet Take 0.5 mg by mouth 3 (three) times daily. For anxiety   Yes Historical Provider, MD  donepezil (ARICEPT) 10 MG tablet Take 1 tablet (10 mg total) by mouth at bedtime. 10/12/15  Yes [name], NP  hydrochlorothiazide (HYDRODIURIL) 25 MG tablet Take 25 mg by mouth daily.   Yes Historical Provider, MD  losartan (COZAAR) 100 MG tablet Take 100 mg by mouth daily.   Yes Historical Provider, MD  memantine (NAMENDA) 10 MG tablet Take 1 tablet (10 mg total) by mouth 2 (two) times daily. 10/12/15  Yes [name], NP  Multiple Vitamin (MULTIVITAMIN WITH MINERALS) TABS tablet Take 1 tablet by mouth daily.   Yes Historical Provider, MD  potassium chloride  SA (K-DUR,KLOR-CON) 20 MEQ tablet Take 20 mEq by mouth daily. Reported on 10/12/2015   Yes Historical Provider, MD  simvastatin (ZOCOR) 20 MG tablet Take 20 mg by mouth daily.   Yes Historical Provider, MD  traMADol (ULTRAM) 50 MG tablet Take 50 mg by mouth every 6 (six) hours as needed.   Yes Historical Provider, MD  traZODone (DESYREL) 50 MG tablet Take 0.5 tablets (25 mg total) by mouth at bedtime. 10/14/15  Yes [name], NP    Physical Exam: Vitals:   04/25/16 1403  04/25/16 1500 04/25/16 1530 04/25/16 1554  BP: (!) 88/50 (!) 95/45 (!) 57/41   Pulse: (!) 53 (!) 54 (!) 54   Resp: 18 14 12    Temp:      TempSrc:      SpO2: 100% 100% 100%   Weight:    72 kg (158 lb 11.7 oz)  Height:    5\' 3"  (1.6 m)      Constitutional: NAD, calm, comfortable Vitals:   04/25/16 1403 04/25/16 1500 04/25/16 1530 04/25/16 1554  BP: (!) 88/50 (!) 95/45 (!) 57/41   Pulse: (!) 53 (!) 54 (!) 54   Resp: 18 14 12    Temp:      TempSrc:      SpO2: 100% 100% 100%   Weight:    72 kg (158 lb 11.7 oz)  Height:    5\' 3"  (1.6 m)   Eyes: PERRL, lids and conjunctivae normal ENMT: Mucous membranes are moist. Posterior pharynx clear of any exudate or lesions.Normal dentition.  Neck: normal, supple, no masses, no thyromegaly Respiratory: clear to auscultation bilaterally, no wheezing, no crackles. Normal respiratory effort. No accessory muscle use.  Cardiovascular: Regular rate and rhythm, no murmurs / rubs / gallops. No extremity edema. 2+ pedal pulses. No carotid bruits.  Abdomen: no tenderness, no masses palpated. No hepatosplenomegaly. Bowel sounds positive.  Musculoskeletal: no clubbing / cyanosis. No joint deformity upper and lower extremities. Good ROM, no contractures. Normal muscle tone.  Skin: no rashes, lesions, ulcers. No induration Neurologic: CN 2-12 grossly intact. Sensation intact, DTR normal. Strength 5/5 in all 4.  Psychiatric: confused, pleasant.     Labs on Admission: I have personally reviewed following labs and imaging studies  CBC:  Recent Labs Lab 04/25/16 1330  WBC 9.9  NEUTROABS 7.9*  HGB 12.3  HCT 36.8  MCV 92.5  PLT [id number]   Basic Metabolic Panel:  Recent Labs Lab 04/25/16 1330  NA 136  K 4.4  CL 101  CO2 29  GLUCOSE 117*  BUN 34*  CREATININE 2.40*  CALCIUM 9.5   GFR: Estimated Creatinine Clearance: 16.9 mL/min (by C-G formula based on SCr of 2.4 mg/dL (H)). Liver Function Tests:  Recent Labs Lab 04/25/16 1330  AST 15  ALT  10*  ALKPHOS 70  BILITOT 0.4  PROT 6.8  ALBUMIN 3.6   No results for input(s): LIPASE, AMYLASE in the last 168 hours. No results for input(s): AMMONIA in the last 168 hours. Coagulation Profile: No results for input(s): INR, PROTIME in the last 168 hours. Cardiac Enzymes: No results for input(s): CKTOTAL, CKMB, CKMBINDEX, TROPONINI in the last 168 hours. BNP (last 3 results) No results for input(s): PROBNP in the last 8760 hours. HbA1C: No results for input(s): HGBA1C in the last 72 hours. CBG: No results for input(s): GLUCAP in the last 168 hours. Lipid Profile: No results for input(s): CHOL, HDL, LDLCALC, TRIG, CHOLHDL, LDLDIRECT in the last 72 hours. Thyroid Function Tests: No  results for input(s): TSH, T4TOTAL, FREET4, T3FREE, THYROIDAB in the last 72 hours. Anemia Panel: No results for input(s): VITAMINB12, FOLATE, FERRITIN, TIBC, IRON, RETICCTPCT in the last 72 hours. Urine analysis:    Component Value Date/Time   COLORURINE YELLOW 09/14/2015 1300   APPEARANCEUR CLEAR 09/14/2015 1300   LABSPEC 1.020 09/14/2015 1300   PHURINE 7.0 09/14/2015 1300   GLUCOSEU NEGATIVE 09/14/2015 1300   HGBUR TRACE (A) 09/14/2015 1300   BILIRUBINUR NEGATIVE 09/14/2015 1300   KETONESUR TRACE (A) 09/14/2015 1300   PROTEINUR NEGATIVE 09/14/2015 1300   UROBILINOGEN 0.2 10/07/2013 0406   NITRITE NEGATIVE 09/14/2015 1300   LEUKOCYTESUR TRACE (A) 09/14/2015 1300   Sepsis Labs: !!!!!!!!!!!!!!!!!!!!!!!!!!!!!!!!!!!!!!!!!!!! @LABRCNTIP (procalcitonin:4,lacticidven:4) )No results found for this or any previous visit (from the past 240 hour(s)).   Radiological Exams on Admission: Dg Chest Port 1 View  Result Date: 04/25/2016 CLINICAL DATA:  Hypertension, cough. EXAM: PORTABLE CHEST 1 VIEW COMPARISON:  Radiographs of May 24, 2012. FINDINGS: Stable cardiomediastinal silhouette. Atherosclerosis of thoracic aorta is noted. Hypoinflation of the lungs is noted with mild bibasilar subsegmental  atelectasis or possibly edema. No pneumothorax or significant pleural effusion is noted. Bony thorax is intact. IMPRESSION: Aortic atherosclerosis. Hypoinflation of the lungs is noted with probable bibasilar subsegmental atelectasis or less likely edema. Electronically Signed   By: Marijo Conception, M.D.   On: 04/25/2016 13:51    Assessment/Plan Active Problems:   High cholesterol   Alzheimer's disease   Hypotension   Diarrhea   Acute renal failure ([place])   Acute renal failure (ARF) ([place])    1. Hypotension. Suspect this is related to volume depletion due to diarrhea. She is afebrile. Her WBC count is not elevated. She does not appear toxic. Suspect this will improve with IV fluids alone. Blood cultures have been sent. We'll hold off on further antibiotics for now.  2. Acute renal failure. Prerenal related to volume depletion. Continue IV fluids. Recheck labs in a.m . if renal function fails to improve, can consider further workup. She is chronically on hydrochlorothiazide and losartan which will be held  3. Diarrhea. GI pathogen panel and stool for Clostridium difficile have been ordered.  4. Hyperlipidemia. Continue statin  5. History of hypertension. Currently blood pressures are low. We'll hold off on resuming ARB and thiazide.   DVT prophylaxis: heparin Code Status: full Family Communication: discussed with sister in law at bedside Disposition Plan: discharge back to facility when improved Consults called:  Admission status: inpatient   [facility] MD [facility] Pager [phone]  If 7PM-7AM, please contact night-coverage [email or web address] Password [email or web address]  04/25/2016, 4:53 PM

## 2016-04-25 NOTE — ED Triage Notes (Signed)
Pt from [facility] in [place], reports diarrhea x24 hours and hypotension. 78/46, 63/40 per nursing home.  Pt has also been weak.

## 2016-04-25 NOTE — ED Notes (Signed)
Family at bedside. 

## 2016-04-25 NOTE — ED Provider Notes (Signed)
[facility] DEPT Provider Note   CSN: [id number] Arrival date & time: 04/25/16  1152  By signing my name below, I, [name], attest that this documentation has been prepared under the direction and in the presence of [name], MD. Electronically Signed: [name], ED Scribe. 04/25/16. 1:31 PM.   History   Chief Complaint Chief Complaint  Patient presents with  . Diarrhea    HPI HPI Comments: [name] is a 82 y.o. female with a h/o dementia who presents to the Emergency Department from [facility] complaining of persistent diarrhea x 1 day. Her relative states they were informed she had diarrhea and deny anyone else has experienced diarrhea that she is in contact with. They report she has experienced associated weakness, fatigue and a mild cough. Pt typically ambulates normally but has not been able to do so today. Her relative denies she has been on abx recently and deny she has been dx with c-diff in the past. Her family denies she has a h/o heart failure. They deny she has experienced fever and chills.   The history is provided by a relative, medical records and the patient. The history is limited by the condition of the patient. No language interpreter was used.    Past Medical History:  Diagnosis Date  . Alzheimer's disease 09/11/2014  . Coronary artery disease   . Dementia   . High cholesterol   . Hypertension     Patient Active Problem List   Diagnosis Date Noted  . Alzheimer's disease 09/11/2014  . Chest pain 05/24/2012  . Hypertension 08/30/2011  . High cholesterol 08/30/2011    Past Surgical History:  Procedure Laterality Date  . CATARACT EXTRACTION W/PHACO  12/13/2011   Procedure: CATARACT EXTRACTION PHACO AND INTRAOCULAR LENS PLACEMENT (IOC);  Surgeon: [name], MD;  Location: [facility];  Service: Ophthalmology;  Laterality: Right;  CDE 12.67  . CATARACT EXTRACTION W/PHACO  03/06/2012   Procedure: CATARACT EXTRACTION  PHACO AND INTRAOCULAR LENS PLACEMENT (IOC);  Surgeon: [name], MD;  Location: [facility];  Service: Ophthalmology;  Laterality: Left;  CDE:13.12  . CHOLECYSTECTOMY    . COLONOSCOPY  09/28/2011   Procedure: COLONOSCOPY;  Surgeon: [name], MD;  Location: [facility]ITE;  Service: Endoscopy;  Laterality: N/A;  1200    OB History    No data available       Home Medications    Prior to Admission medications   Medication Sig Start Date End Date Taking? Authorizing Provider  ALPRAZolam [name]) 0.5 MG tablet Take 0.5 mg by mouth 3 (three) times daily. For anxiety    Historical Provider, MD  donepezil (ARICEPT) 10 MG tablet Take 1 tablet (10 mg total) by mouth at bedtime. 10/12/15   [name], NP  hydrochlorothiazide (HYDRODIURIL) 25 MG tablet Take 25 mg by mouth daily.    Historical Provider, MD  losartan (COZAAR) 100 MG tablet Take 100 mg by mouth daily.    Historical Provider, MD  memantine (NAMENDA) 10 MG tablet Take 1 tablet (10 mg total) by mouth 2 (two) times daily. 10/12/15   [name], NP  Multiple Vitamin (MULTIVITAMIN WITH MINERALS) TABS tablet Take 1 tablet by mouth daily.    Historical Provider, MD  potassium chloride SA (K-DUR,KLOR-CON) 20 MEQ tablet Take 20 mEq by mouth daily. Reported on 10/12/2015    Historical Provider, MD  traMADol (ULTRAM) 50 MG tablet Take 50 mg by mouth every 6 (six) hours as needed.  Historical Provider, MD  traZODone (DESYREL) 50 MG tablet Take 0.5 tablets (25 mg total) by mouth at bedtime. 10/14/15   [name], NP    Family History Family History  Problem Relation Age of Onset  . Dementia Sister   . Dementia Brother     Social History Social History  Substance Use Topics  . Smoking status: Former Smoker    Packs/day: 0.25    Years: 10.00    Quit date: 07/18/2007  . Smokeless tobacco: Never Used  . Alcohol use No     Allergies   Review of patient's allergies indicates no known allergies.   Review of  Systems Review of Systems  Constitutional: Positive for activity change and fatigue. Negative for chills and fever.  Respiratory: Positive for cough.   Cardiovascular: Negative for leg swelling.  Gastrointestinal: Positive for diarrhea.  Neurological: Positive for weakness.  All other systems reviewed and are negative.  Physical Exam Updated Vital Signs BP (!) 88/50   Pulse (!) 53   Temp (!) 95 F (35 C) (Rectal)   Resp 18   Ht 5\' 8"  (1.727 m)   Wt 165 lb (74.8 kg)   SpO2 100%   BMI 25.09 kg/m   Physical Exam  Constitutional: She is oriented to person, place, and time. She appears well-developed and well-nourished.  HENT:  Head: Normocephalic and atraumatic.  Mouth/Throat: Mucous membranes are dry (mild).  Eyes: EOM are normal.  Neck: Normal range of motion.  Cardiovascular: Regular rhythm.  Bradycardia present.   Pulmonary/Chest: Effort normal and breath sounds normal. No respiratory distress. She has no wheezes. She has no rales.  Abdominal: Soft. There is no tenderness.  Musculoskeletal: Normal range of motion. She exhibits no edema.  No significant swelling in the legs   Neurological: She is alert and oriented to person, place, and time.  Skin: Skin is warm and dry.  Psychiatric: She has a normal mood and affect.  Pt with a h/o dementia   Nursing note and vitals reviewed.  ED Treatments / Results  Labs (all labs ordered are listed, but only abnormal results are displayed) Labs Reviewed  COMPREHENSIVE METABOLIC PANEL - Abnormal; Notable for the following:       Result Value   Glucose, Bld 117 (*)    BUN 34 (*)    Creatinine, Ser 2.40 (*)    ALT 10 (*)    GFR calc non Af Amer 18 (*)    GFR calc Af Amer 20 (*)    All other components within normal limits  CBC WITH DIFFERENTIAL/PLATELET - Abnormal; Notable for the following:    Neutro Abs 7.9 (*)    All other components within normal limits  CULTURE, BLOOD (ROUTINE X 2)  URINE CULTURE  GASTROINTESTINAL PANEL  BY PCR, STOOL (REPLACES STOOL CULTURE)  URINALYSIS, ROUTINE W REFLEX MICROSCOPIC (NOT AT [facility])  I-STAT CG4 LACTIC ACID, ED    EKG  EKG Interpretation None       Radiology Dg Chest Port 1 View  Result Date: 04/25/2016 CLINICAL DATA:  Hypertension, cough. EXAM: PORTABLE CHEST 1 VIEW COMPARISON:  Radiographs of May 24, 2012. FINDINGS: Stable cardiomediastinal silhouette. Atherosclerosis of thoracic aorta is noted. Hypoinflation of the lungs is noted with mild bibasilar subsegmental atelectasis or possibly edema. No pneumothorax or significant pleural effusion is noted. Bony thorax is intact. IMPRESSION: Aortic atherosclerosis. Hypoinflation of the lungs is noted with probable bibasilar subsegmental atelectasis or less likely edema. Electronically Signed   By: Sabino Dick  Brooke Bonito, M.D.   On: 04/25/2016 13:51    Procedures Procedures  CRITICAL CARE Performed by: Mariea Clonts   Total critical care time: 35 minutes  Critical care time was exclusive of separately billable procedures and treating other patients.  Critical care was necessary to treat or prevent imminent or life-threatening deterioration.  Critical care was time spent personally by me on the following activities: development of treatment plan with patient and/or surrogate as well as nursing, discussions with consultants, evaluation of patient's response to treatment, examination of patient, obtaining history from patient or surrogate, ordering and performing treatments and interventions, ordering and review of laboratory studies, ordering and review of radiographic studies, pulse oximetry and re-evaluation of patient's condition.  DIAGNOSTIC STUDIES: Oxygen Saturation is 99% on RA, normal by my interpretation.    COORDINATION OF CARE: 1:28 PM Discussed next steps with pt and her family. They verbalized understanding and are agreeable with the plan.    Medications Ordered in ED Medications  sodium chloride 0.9 % bolus  1,000 mL (0 mLs Intravenous Stopped 04/25/16 1357)    And  sodium chloride 0.9 % bolus 1,000 mL (1,000 mLs Intravenous New Bag/Given 04/25/16 1357)    And  sodium chloride 0.9 % bolus 250 mL (not administered)  piperacillin-tazobactam (ZOSYN) IVPB 3.375 g (not administered)     Initial Impression / Assessment and Plan / ED Course  I have reviewed the triage vital signs and the nursing notes.  Pertinent labs & imaging results that were available during my care of the patient were reviewed by me and considered in my medical decision making (see chart for details).  Clinical Course   Patient presents with recurrent diarrhea and fatigue from adult care assisted living. Patient arrived with blood pressure in the 70s. Patient clinically dry and generally weak. Code sepsis and 30 mL/kg ordered. Blood cultures ordered. Zosyn antibiotic order this time stool culture ordered.  Patient's blood pressure improved 88/50 in the ER. Patient stable for stepdown paged hospitalist.  The patients results and plan were reviewed and discussed.   Any x-rays performed were independently reviewed by myself.   Differential diagnosis were considered with the presenting HPI.  Medications  sodium chloride 0.9 % bolus 1,000 mL (0 mLs Intravenous Stopped 04/25/16 1357)    And  sodium chloride 0.9 % bolus 1,000 mL (1,000 mLs Intravenous New Bag/Given 04/25/16 1357)    And  sodium chloride 0.9 % bolus 250 mL (not administered)  piperacillin-tazobactam (ZOSYN) IVPB 3.375 g (not administered)    Vitals:   04/25/16 1300 04/25/16 1330 04/25/16 1358 04/25/16 1403  BP: (!) 80/39 (!) 84/31  (!) 88/50  Pulse: (!) 52 (!) 50  (!) 53  Resp: 10 14  18   Temp:   (!) 95 F (35 C)   TempSrc:   Rectal   SpO2: 100% 100%  100%  Weight:      Height:        Final diagnoses:  Hypotension, unspecified hypotension type  Acute renal failure, unspecified acute renal failure type (HCC)  Diarrhea, unspecified type    Admission/  observation were discussed with the admitting physician, patient and/or family and they are comfortable with the plan.   Final Clinical Impressions(s) / ED Diagnoses   Final diagnoses:  Hypotension, unspecified hypotension type  Acute renal failure, unspecified acute renal failure type ([place])  Diarrhea, unspecified type    New Prescriptions New Prescriptions   No medications on file     [name], MD 05/03/16  1637  

## 2016-04-25 NOTE — ED Notes (Signed)
Dr. Thurnell Garbe aware of pt condition, okay to hang fluids.  Updated primary nurse, JJ that pt needed 2 IV lines.

## 2016-04-26 LAB — COMPREHENSIVE METABOLIC PANEL
ALBUMIN: 3.1 g/dL — AB (ref 3.5–5.0)
ALK PHOS: 63 U/L (ref 38–126)
ALT: 8 U/L — AB (ref 14–54)
AST: 12 U/L — AB (ref 15–41)
Anion gap: 6 (ref 5–15)
BUN: 22 mg/dL — ABNORMAL HIGH (ref 6–20)
CHLORIDE: 111 mmol/L (ref 101–111)
CO2: 23 mmol/L (ref 22–32)
CREATININE: 1.38 mg/dL — AB (ref 0.44–1.00)
Calcium: 8.5 mg/dL — ABNORMAL LOW (ref 8.9–10.3)
GFR calc non Af Amer: 34 mL/min — ABNORMAL LOW (ref >60)
GFR, EST AFRICAN AMERICAN: 40 mL/min — AB (ref >60)
GLUCOSE: 95 mg/dL (ref 65–99)
Potassium: 3.6 mmol/L (ref 3.5–5.1)
SODIUM: 140 mmol/L (ref 135–145)
Total Bilirubin: 0.2 mg/dL — ABNORMAL LOW (ref 0.3–1.2)
Total Protein: 5.9 g/dL — ABNORMAL LOW (ref 6.5–8.1)

## 2016-04-26 LAB — CBC
HCT: 34.4 % — ABNORMAL LOW (ref 36.0–46.0)
Hemoglobin: 11.4 g/dL — ABNORMAL LOW (ref 12.0–15.0)
MCH: 31.1 pg (ref 26.0–34.0)
MCHC: 33.1 g/dL (ref 30.0–36.0)
MCV: 93.7 fL (ref 78.0–100.0)
PLATELETS: 141 10*3/uL — AB (ref 150–400)
RBC: 3.67 MIL/uL — AB (ref 3.87–5.11)
RDW: 12.7 % (ref 11.5–15.5)
WBC: 6.7 10*3/uL (ref 4.0–10.5)

## 2016-04-26 LAB — TSH: TSH: 0.584 u[IU]/mL (ref 0.350–4.500)

## 2016-04-26 NOTE — Progress Notes (Signed)
Called report to [facility], Therapist, sports on dept 300. Verbalized understanding. Pt transferred to room 307 in safe and stable condition.

## 2016-04-26 NOTE — Progress Notes (Signed)
Pt has been alert and pleasant. Pt had some issues with her BP running low 70s/30s and heart rate as low as 30 throughout the night. Paged [facility] twice and gave pt total bolus of 1500cc and increased fluids to 150cc/hr and pt bp has gotten better 100s/40s. Pt is still sinus brady in the 40s but pt is easily aroused and when she is awake her hr goes back up into the 60s and 70s.

## 2016-04-26 NOTE — Clinical Social Work Note (Signed)
Clinical Social Work Assessment  Patient Details  Name: [name] MRN: [id number] Date of Birth: 19-Sep-1929  Date of referral:  04/26/16               Reason for consult:  Facility Placement                Permission sought to share information with:    Permission granted to share information::     Name::        Agency::     Relationship::     Contact Information:     Housing/Transportation Living arrangements for the past 2 months:   ([facility]) Source of Information:  Facility Patient Interpreter Needed:  None Criminal Activity/Legal Involvement Pertinent to Current Situation/Hospitalization:    Significant Relationships:  Adult Children Lives with:  Facility Resident Do you feel safe going back to the place where you live?  Yes Need for family participation in patient care:  Yes (Comment)  Care giving concerns:  None identified.   Social Worker assessment / plan:  CSW left a voicemail message for patient's son, Roxanna Mew. Anne at [facility] advised that patient ambulates unassisted, has good family support, feeds herself, and requires assistance with bathing and using the restroom. She stated that patient can return to the facility at discharge. Patient has been at the facility since 11/04/15.   Employment status:  Retired Nurse, adult PT Recommendations:  Not assessed at this time Information / Referral to community resources:     Patient/Family's Response to care:  Voicemail left for patient's son.   Patient/Family's Understanding of and Emotional Response to Diagnosis, Current Treatment, and Prognosis:  Voicemail left for patient's son.   Emotional Assessment Appearance:  Appears stated age Attitude/Demeanor/Rapport:  Unable to Assess Affect (typically observed):  Unable to Assess Orientation:    Alcohol / Substance use:    Psych involvement (Current and /or in the community):     Discharge Needs  Concerns to be  addressed:    Readmission within the last 30 days:    Current discharge risk:    Barriers to Discharge:      Ihor Gully, LCSW 04/26/2016, 12:10 PM

## 2016-04-26 NOTE — Progress Notes (Signed)
PROGRESS NOTE    [name]  [id number] DOB: 09-06-1929 DOA: 04/25/2016 PCP: [name], MD    Brief Narrative:  This patient was admitted to the hospital with hypotension which was felt to be due to volume depletion, due to diarrhea. She was also noted to have acute renal failure related to dehydration. She was aggressively hydrated with improvement of renal function. Stool studies have been ordered. She is noted to be bradycardic, but asymptomatic. Monitor for another 24 hours. Will get physical therapy evaluation. Anticipate discharge tomorrow if stable   Assessment & Plan:   Active Problems:   High cholesterol   Alzheimer's disease   Hypotension   Diarrhea   Acute renal failure ([place])   Acute renal failure (ARF) ([place])   1. Hypotension. Suspect this is related to volume depletion due to diarrhea. She is afebrile. Her WBC count is not elevated. She does not appear toxic. This is improving with IV fluids. Blood cultures have been sent. We'll hold off on further antibiotics for now.  2. Acute renal failure. Prerenal related to volume depletion. Continue IV fluids. Recheck labs in a.m. She is chronically on hydrochlorothiazide and losartan which will be held  3. Diarrhea. GI pathogen panel and stool for Clostridium difficile have been ordered.  4. Hyperlipidemia. Continue statin  5. History of hypertension. Currently blood pressures are low. We'll hold off on resuming ARB and thiazide.  6. Bradycardia. Mostly while patient is sleeping. Asymptomatic otherwise. Check TSH. Continue to monitor on telemetry  7. Dementia. Continue aricept and namenda   DVT prophylaxis: heparin Code Status: DNR Family Communication: discussed with son who is POA on 10/9 Disposition Plan: return to facility on discharge.   Consultants:      Procedures:  Antimicrobials:      Subjective: Confused. Does not know she is in the hospital  Objective: Vitals:   04/26/16 0700  04/26/16 0800 04/26/16 0835 04/26/16 0900  BP: (!) 96/38 (!) 119/52  (!) 109/41  Pulse: (!) 50 (!) 58  (!) 58  Resp: 12 (!) 23  14  Temp:   97.9 F (36.6 C)   TempSrc:   Oral   SpO2: 100% 100%  100%  Weight:      Height:        Intake/Output Summary (Last 24 hours) at 04/26/16 0934 Last data filed at 04/26/16 0835  Gross per 24 hour  Intake             1390 ml  Output             2350 ml  Net             -960 ml   Filed Weights   04/25/16 1229 04/25/16 1554 04/26/16 0400  Weight: 74.8 kg (165 lb) 72 kg (158 lb 11.7 oz) 73.8 kg (162 lb 11.2 oz)    Examination:  General exam: Appears calm and comfortable  Respiratory system: Clear to auscultation. Respiratory effort normal. Cardiovascular system: S1 & S2 heard, bradycardic. No JVD, murmurs, rubs, gallops or clicks. 1+ pedal edema. Gastrointestinal system: Abdomen is nondistended, soft and nontender. No organomegaly or masses felt. Normal bowel sounds heard. Central nervous system: confused. No focal neurological deficits. Extremities: Symmetric 5 x 5 power. Skin: No rashes, lesions or ulcers Psychiatry: confused, pleasant    Data Reviewed: I have personally reviewed following labs and imaging studies  CBC:  Recent Labs Lab 04/25/16 1330 04/26/16 0505  WBC 9.9 6.7  NEUTROABS 7.9*  --  HGB 12.3 11.4*  HCT 36.8 34.4*  MCV 92.5 93.7  PLT 171 [id number]*   Basic Metabolic Panel:  Recent Labs Lab 04/25/16 1330 04/26/16 0505  NA 136 140  K 4.4 3.6  CL 101 111  CO2 29 23  GLUCOSE 117* 95  BUN 34* 22*  CREATININE 2.40* 1.38*  CALCIUM 9.5 8.5*   GFR: Estimated Creatinine Clearance: 29.7 mL/min (by C-G formula based on SCr of 1.38 mg/dL (H)). Liver Function Tests:  Recent Labs Lab 04/25/16 1330 04/26/16 0505  AST 15 12*  ALT 10* 8*  ALKPHOS 70 63  BILITOT 0.4 0.2*  PROT 6.8 5.9*  ALBUMIN 3.6 3.1*   No results for input(s): LIPASE, AMYLASE in the last 168 hours. No results for input(s): AMMONIA in the  last 168 hours. Coagulation Profile: No results for input(s): INR, PROTIME in the last 168 hours. Cardiac Enzymes: No results for input(s): CKTOTAL, CKMB, CKMBINDEX, TROPONINI in the last 168 hours. BNP (last 3 results) No results for input(s): PROBNP in the last 8760 hours. HbA1C: No results for input(s): HGBA1C in the last 72 hours. CBG: No results for input(s): GLUCAP in the last 168 hours. Lipid Profile: No results for input(s): CHOL, HDL, LDLCALC, TRIG, CHOLHDL, LDLDIRECT in the last 72 hours. Thyroid Function Tests: No results for input(s): TSH, T4TOTAL, FREET4, T3FREE, THYROIDAB in the last 72 hours. Anemia Panel: No results for input(s): VITAMINB12, FOLATE, FERRITIN, TIBC, IRON, RETICCTPCT in the last 72 hours. Sepsis Labs:  Recent Labs Lab 04/25/16 1404 04/25/16 1847 04/25/16 2230  LATICACIDVEN 1.46 1.7 1.1    Recent Results (from the past 240 hour(s))  Blood Culture (routine x 2)     Status: None (Preliminary result)   Collection Time: 04/25/16  1:46 PM  Result Value Ref Range Status   Specimen Description BLOOD LEFT HAND  Final   Special Requests BOTTLES DRAWN AEROBIC ONLY 4CC  Final   Culture NO GROWTH < 24 HOURS  Final   Report Status PENDING  Incomplete  MRSA PCR Screening     Status: None   Collection Time: 04/25/16  4:00 PM  Result Value Ref Range Status   MRSA by PCR NEGATIVE NEGATIVE Final    Comment:        The GeneXpert MRSA Assay (FDA approved for NASAL specimens only), is one component of a comprehensive MRSA colonization surveillance program. It is not intended to diagnose MRSA infection nor to guide or monitor treatment for MRSA infections.          Radiology Studies: Dg Chest Port 1 View  Result Date: 04/25/2016 CLINICAL DATA:  Hypertension, cough. EXAM: PORTABLE CHEST 1 VIEW COMPARISON:  Radiographs of May 24, 2012. FINDINGS: Stable cardiomediastinal silhouette. Atherosclerosis of thoracic aorta is noted. Hypoinflation of the  lungs is noted with mild bibasilar subsegmental atelectasis or possibly edema. No pneumothorax or significant pleural effusion is noted. Bony thorax is intact. IMPRESSION: Aortic atherosclerosis. Hypoinflation of the lungs is noted with probable bibasilar subsegmental atelectasis or less likely edema. Electronically Signed   By: Marijo Conception, M.D.   On: 04/25/2016 13:51        Scheduled Meds: . ALPRAZolam  0.5 mg Oral TID  . donepezil  10 mg Oral QHS  . heparin  5,000 Units Subcutaneous Q8H  . Influenza vac split quadrivalent PF  0.5 mL Intramuscular Tomorrow-1000  . memantine  10 mg Oral BID  . pneumococcal 23 valent vaccine  0.5 mL Intramuscular Tomorrow-1000  . simvastatin  20 mg  Oral Daily  . sodium chloride  250 mL Intravenous Once  . traZODone  25 mg Oral QHS   Continuous Infusions: . sodium chloride 150 mL/hr at 04/26/16 0124     LOS: 1 day    Time spent: 64mins    MEMON,JEHANZEB, MD [facility] Pager [phone]  If 7PM-7AM, please contact night-coverage [email or web address] Password [email or web address] 04/26/2016, 9:34 AM

## 2016-04-26 NOTE — Evaluation (Signed)
Physical Therapy Evaluation Patient Details Name: [name] MRN: [id number] DOB: 1930/05/31 Today's Date: 04/26/2016   History of Present Illness  82 y.o. female with medical history significant of dementia, hypertension, hyperlipidemia is brought to the hospital with diarrhea and hypotension.  Clinical Impression  Pt received in bed, with sister-in-law, and dtr-in-law present.  Pt is marginally agreeable to PT evaluation, and requires education and encouragement to participate.  PT lives at [facility], a group home, and  at baseline, pt does not use any DME for ambulation.  Pt required Mod A for sit<>stand with RW.  She was able to ambulate 22ft with RW and Min A.  Pt would benefit from HHPT at her facility to continue to progress her strength, balance and mobility.      Follow Up Recommendations Home health PT;Supervision/Assistance - 24 hour    Equipment Recommendations  None recommended by PT    Recommendations for Other Services       Precautions / Restrictions Precautions Precautions: Fall Precaution Comments: Due to weakness and immobility.  Restrictions Weight Bearing Restrictions: No      Mobility  Bed Mobility Overal bed mobility: Needs Assistance Bed Mobility: Supine to Sit     Supine to sit: HOB elevated (increased time)        Transfers Overall transfer level: Needs assistance Equipment used: Rolling walker (2 wheeled) Transfers: Sit to/from Stand Sit to Stand: Min assist;Mod assist (Mod A when attempted without RW, and Min A when using RW.)         General transfer comment: Pt assisted to the [facility], and required assistance for peri-hygiene.    Ambulation/Gait Ambulation/Gait assistance: Min assist;Mod assist Ambulation Distance (Feet): 40 Feet Assistive device: Rolling walker (2 wheeled) Gait Pattern/deviations: Step-to pattern;Trunk flexed   Gait velocity interpretation: <1.8 ft/sec, indicative of risk for recurrent falls General Gait  Details: assistance for RW navigation.  Pt does not use one at baseline.   Stairs            Wheelchair Mobility    Modified Rankin (Stroke Patients Only)       Balance Overall balance assessment: Needs assistance Sitting-balance support: Bilateral upper extremity supported;Feet supported Sitting balance-Leahy Scale: Good     Standing balance support: Bilateral upper extremity supported Standing balance-Leahy Scale: Fair                               Pertinent Vitals/Pain Pain Assessment: Faces Faces Pain Scale: Hurts little more Pain Location: L lateral lower leg - expresses that she is not having pain, but continues to c/o her L LE.  Pain Intervention(s): Repositioned;Limited activity within patient's tolerance    Home Living     Available Help at Discharge: Available 24 hours/day           Home Equipment: None      Prior Function     Gait / Transfers Assistance Needed: Per chart and family, pt did not use any DME for ambulation.   ADL's / Homemaking Assistance Needed: Per chart and family, pt only required assistance for bathing, and toileting        Hand Dominance        Extremity/Trunk Assessment   Upper Extremity Assessment: Overall WFL for tasks assessed           Lower Extremity Assessment: Generalized weakness         Communication   Communication: Other (comment) (dementia)  Cognition Arousal/Alertness: Awake/alert Behavior During Therapy: WFL for tasks assessed/performed Overall Cognitive Status: History of cognitive impairments - at baseline                      General Comments      Exercises     Assessment/Plan    PT Assessment Patient needs continued PT services  PT Problem List Decreased strength;Decreased activity tolerance;Decreased balance;Decreased mobility;Decreased cognition;Decreased knowledge of use of DME;Decreased safety awareness;Decreased knowledge of precautions          PT  Treatment Interventions DME instruction;Gait training;Functional mobility training;Therapeutic activities;Therapeutic exercise;Balance training;Patient/family education    PT Goals (Current goals can be found in the Care Plan section)  Acute Rehab PT Goals Patient Stated Goal: To get stronger and walk better. PT Goal Formulation: With patient/family Time For Goal Achievement: 05/03/16 Potential to Achieve Goals: Fair    Frequency Min 3X/week   Barriers to discharge        Co-evaluation               End of Session Equipment Utilized During Treatment: Gait belt Activity Tolerance: Patient tolerated treatment well;Patient limited by fatigue Patient left: in chair;with call bell/phone within reach;with family/visitor present      Functional Assessment Tool Used: [employer] "6-clicks"  Functional Limitation: Mobility: Walking and moving around Mobility: Walking and Moving Around Current Status [phone]): At least 40 percent but less than 60 percent impaired, limited or restricted Mobility: Walking and Moving Around Goal Status [phone]): At least 20 percent but less than 40 percent impaired, limited or restricted    Time: [id number] PT Time Calculation (min) (ACUTE ONLY): 34 min   Charges:         PT G Codes:   PT G-Codes **NOT FOR INPATIENT CLASS** Functional Assessment Tool Used: [employer] "6-clicks"  Functional Limitation: Mobility: Walking and moving around Mobility: Walking and Moving Around Current Status [phone]): At least 40 percent but less than 60 percent impaired, limited or restricted Mobility: Walking and Moving Around Goal Status [phone]): At least 20 percent but less than 40 percent impaired, limited or restricted    Beth Ota Ebersole, PT, DPT X: [phone]

## 2016-04-27 DIAGNOSIS — N179 Acute kidney failure, unspecified: Principal | ICD-10-CM

## 2016-04-27 DIAGNOSIS — I9589 Other hypotension: Secondary | ICD-10-CM

## 2016-04-27 DIAGNOSIS — R197 Diarrhea, unspecified: Secondary | ICD-10-CM

## 2016-04-27 LAB — CBC
HEMATOCRIT: 34.5 % — AB (ref 36.0–46.0)
Hemoglobin: 11.8 g/dL — ABNORMAL LOW (ref 12.0–15.0)
MCH: 31.1 pg (ref 26.0–34.0)
MCHC: 34.2 g/dL (ref 30.0–36.0)
MCV: 91 fL (ref 78.0–100.0)
Platelets: 100 10*3/uL — ABNORMAL LOW (ref 150–400)
RBC: 3.79 MIL/uL — ABNORMAL LOW (ref 3.87–5.11)
RDW: 12.9 % (ref 11.5–15.5)
WBC: 5.8 10*3/uL (ref 4.0–10.5)

## 2016-04-27 LAB — BASIC METABOLIC PANEL
Anion gap: 4 — ABNORMAL LOW (ref 5–15)
BUN: 13 mg/dL (ref 6–20)
CHLORIDE: 112 mmol/L — AB (ref 101–111)
CO2: 24 mmol/L (ref 22–32)
Calcium: 8.7 mg/dL — ABNORMAL LOW (ref 8.9–10.3)
Creatinine, Ser: 0.95 mg/dL (ref 0.44–1.00)
GFR calc Af Amer: >60 mL/min (ref >60)
GFR calc non Af Amer: 54 mL/min — ABNORMAL LOW (ref >60)
GLUCOSE: 95 mg/dL (ref 65–99)
POTASSIUM: 3.6 mmol/L (ref 3.5–5.1)
Sodium: 140 mmol/L (ref 135–145)

## 2016-04-27 NOTE — NC FL2 (Signed)
[place] MEDICAID FL2 LEVEL OF CARE SCREENING TOOL     IDENTIFICATION  Patient Name: [name] Birthdate: [birth year] Sex: female Admission Date (Current Location): 04/25/2016  [facility] and [place] Number:  [employer] and Address:  [place] [address], [place]      Provider Number: [id number]  Attending Physician Name and Address:  Donne Hazel, MD  Relative Name and Phone Number:       Current Level of Care:  [facility] Recommended Level of Care: [facility] Prior Approval Number:    Date Approved/Denied:   PASRR Number:    Discharge Plan:  [facility])    Current Diagnoses: Patient Active Problem List   Diagnosis Date Noted  . Hypotension 04/25/2016  . Diarrhea 04/25/2016  . Acute renal failure ([place]) 04/25/2016  . Acute renal failure (ARF) ([place]) 04/25/2016  . Alzheimer's disease 09/11/2014  . Chest pain 05/24/2012  . Hypertension 08/30/2011  . High cholesterol 08/30/2011    Orientation RESPIRATION BLADDER Height & Weight     Self  Normal Incontinent Weight: 162 lb 11.2 oz (73.8 kg) Height:  5\' 3"  (160 cm)  BEHAVIORAL SYMPTOMS/MOOD NEUROLOGICAL BOWEL NUTRITION STATUS      Incontinent Diet (Heart Healthy)  AMBULATORY STATUS COMMUNICATION OF NEEDS Skin   Limited Assist Verbally Normal                       Personal Care Assistance Level of Assistance  Bathing, Dressing, Feeding Bathing Assistance: Limited assistance Feeding assistance: Limited assistance Dressing Assistance: Limited assistance     Functional Limitations Info  Sight, Hearing, Speech Sight Info: Adequate Hearing Info: Adequate Speech Info: Adequate    SPECIAL CARE FACTORS FREQUENCY  PT (By licensed PT)     PT Frequency: home health              Contractures      Additional Factors Info  Allergies Code Status Info: DNR Allergies Info: No known allergies     Isolation Precautions Info: Enteric  precautions     Current Medications (04/27/2016):  This is the current hospital active medication list Current Facility-Administered Medications  Medication Dose Route Frequency Provider Last Rate Last Dose  . 0.9 %  sodium chloride infusion   Intravenous Continuous Gardiner Barefoot, NP 150 mL/hr at 04/27/16 0524    . acetaminophen (TYLENOL) tablet 650 mg  650 mg Oral Q6H PRN Kathie Dike, MD       Or  . acetaminophen (TYLENOL) suppository 650 mg  650 mg Rectal Q6H PRN Kathie Dike, MD      . ALPRAZolam [name]) tablet 0.5 mg  0.5 mg Oral TID Kathie Dike, MD   0.5 mg at 04/27/16 0840  . donepezil (ARICEPT) tablet 10 mg  10 mg Oral QHS Kathie Dike, MD   10 mg at 04/26/16 2134  . heparin injection 5,000 Units  5,000 Units Subcutaneous Q8H Kathie Dike, MD   5,000 Units at 04/27/16 0600  . memantine (NAMENDA) tablet 10 mg  10 mg Oral BID Kathie Dike, MD   10 mg at 04/27/16 0840  . ondansetron (ZOFRAN) tablet 4 mg  4 mg Oral Q6H PRN Kathie Dike, MD       Or  . ondansetron (ZOFRAN) injection 4 mg  4 mg Intravenous Q6H PRN Kathie Dike, MD      . simvastatin (ZOCOR) tablet 20 mg  20 mg Oral Daily Kathie Dike, MD  20 mg at 04/27/16 0841  . sodium chloride 0.9 % bolus 250 mL  250 mL Intravenous Once [name], MD      . traMADol Veatrice Bourbon) tablet 50 mg  50 mg Oral Q6H PRN Kathie Dike, MD   50 mg at 04/25/16 2026  . traZODone (DESYREL) tablet 25 mg  25 mg Oral QHS Kathie Dike, MD   25 mg at 04/26/16 2134     Discharge Medications: Medication List    STOP taking these medications   hydrochlorothiazide 25 MG tablet Commonly known as:  HYDRODIURIL    TAKE these medications   acetaminophen 325 MG tablet Commonly known as:  TYLENOL Take 650 mg by mouth every 6 (six) hours as needed.  ALPRAZolam 0.5 MG tablet Commonly known as:  XANAX Take 0.5 mg by mouth 3 (three) times daily. For anxiety  donepezil 10 MG tablet Commonly known as:  ARICEPT Take 1 tablet  (10 mg total) by mouth at bedtime.  losartan 100 MG tablet Commonly known as:  COZAAR Take 100 mg by mouth daily.  memantine 10 MG tablet Commonly known as:  NAMENDA Take 1 tablet (10 mg total) by mouth 2 (two) times daily.  multivitamin with minerals Tabs tablet Take 1 tablet by mouth daily.  potassium chloride SA 20 MEQ tablet Commonly known as:  K-DUR,KLOR-CON Take 20 mEq by mouth daily. Reported on 10/12/2015  simvastatin 20 MG tablet Commonly known as:  ZOCOR Take 20 mg by mouth daily.  traMADol 50 MG tablet Commonly known as:  ULTRAM Take 50 mg by mouth every 6 (six) hours as needed.  traZODone 50 MG tablet Commonly known as:  DESYREL Take 0.5 tablets (25 mg total) by mouth at bedtime.      Relevant Imaging Results:  Relevant Lab Results:   Additional Information    Salome Arnt, [place]

## 2016-04-27 NOTE — Clinical Social Work Note (Signed)
Pt d/c today back to [facility]. Administrator aware and will pick up pt this afternoon. CSW notified him of home health recommendation and that pt would need assistance while ambulating. Pt's son notified of d/c.   Benay Pike, [place]

## 2016-04-27 NOTE — Progress Notes (Signed)
Patient discharged to ALF.  IV removed - WNL.  DC instructions and med changes reviewed to [facility] caregiver - verbalizes understanding, no questions at this time.  Instructed to follow up with PCP.  Assisted off unit via WC in NAD.

## 2016-04-27 NOTE — Discharge Summary (Signed)
Physician Discharge Summary  [name] [id number] DOB: 03-29-1930 DOA: 04/25/2016  PCP: [name], MD  Admit date: 04/25/2016 Discharge date: 04/27/2016  Admitted From: [place] Disposition:  [place]  Recommendations for Outpatient Follow-up:  1. Follow up with PCP in 1-2 weeks 2. Please obtain BMP/CBC in one week  Home Health: Home health PT   Discharge Condition:Improved CODE STATUS:DNR Diet recommendation: Heart healthy   Brief/Interim Summary: Please see admit h and p from 10/9 for details. Briefly, 82yo with hypotension which was felt to be due to volume depletion, due to diarrhea. She was also noted to have acute renal failure related to dehydration. She was aggressively hydrated with improvement of renal function. Stool studies were ordered. She is noted to be bradycardic, but asymptomatic.  1. Hypotension. Likely related to volume depletion due to diarrhea. She is afebrile. Her WBC count is not elevated. She did not appear toxic. Improved with IV fluids. Blood cultures were sent, negative at the time of this dictation.  2. Acute renal failure. Prerenal related to volume depletion. Was given IV fluids with normalization of renal function. She had been chronically maintained on hydrochlorothiazide and losartan which were held.  3. Diarrhea. GI pathogen panel and stool for Clostridium difficile have been ordered. However, patient had no further episodes of diarrhea overnight.  4. Hyperlipidemia. Continue statin  5. History of hypertension. Presented hypotensive. Blood pressure meds held and fluids started with improvement in blood pressure. Patient will continue on cozaar on discharge  6. Bradycardia. Mostly while patient is sleeping. Asymptomatic otherwise. TSH unremarkable. Remained stable the remainder of this admission  7. Dementia. Continued aricept and namenda. Discussed case with patient's son, who states patient has  baseline forgetfulness and not being fully oriented.  Discharge Diagnoses:  Active Problems:   High cholesterol   Alzheimer's disease   Hypotension   Diarrhea   Acute renal failure (HCC)   Acute renal failure (ARF) (HCC)    Discharge Instructions     Medication List    STOP taking these medications   hydrochlorothiazide 25 MG tablet Commonly known as:  HYDRODIURIL     TAKE these medications   acetaminophen 325 MG tablet Commonly known as:  TYLENOL Take 650 mg by mouth every 6 (six) hours as needed.   ALPRAZolam 0.5 MG tablet Commonly known as:  XANAX Take 0.5 mg by mouth 3 (three) times daily. For anxiety   donepezil 10 MG tablet Commonly known as:  ARICEPT Take 1 tablet (10 mg total) by mouth at bedtime.   losartan 100 MG tablet Commonly known as:  COZAAR Take 100 mg by mouth daily.   memantine 10 MG tablet Commonly known as:  NAMENDA Take 1 tablet (10 mg total) by mouth 2 (two) times daily.   multivitamin with minerals Tabs tablet Take 1 tablet by mouth daily.   potassium chloride SA 20 MEQ tablet Commonly known as:  K-DUR,KLOR-CON Take 20 mEq by mouth daily. Reported on 10/12/2015   simvastatin 20 MG tablet Commonly known as:  ZOCOR Take 20 mg by mouth daily.   traMADol 50 MG tablet Commonly known as:  ULTRAM Take 50 mg by mouth every 6 (six) hours as needed.   traZODone 50 MG tablet Commonly known as:  DESYREL Take 0.5 tablets (25 mg total) by mouth at bedtime.      Follow-up Information    [name], MD. Schedule an appointment as soon as possible for a visit in 1 week(s).  Specialty:  Internal Medicine Contact information: [place] [address] [phone]          No Known Allergies   Procedures/Studies: Dg Chest Port 1 View  Result Date: 04/25/2016 CLINICAL DATA:  Hypertension, cough. EXAM: PORTABLE CHEST 1 VIEW COMPARISON:  Radiographs of May 24, 2012. FINDINGS: Stable cardiomediastinal silhouette.  Atherosclerosis of thoracic aorta is noted. Hypoinflation of the lungs is noted with mild bibasilar subsegmental atelectasis or possibly edema. No pneumothorax or significant pleural effusion is noted. Bony thorax is intact. IMPRESSION: Aortic atherosclerosis. Hypoinflation of the lungs is noted with probable bibasilar subsegmental atelectasis or less likely edema. Electronically Signed   By: Marijo Conception, M.D.   On: 04/25/2016 13:51    Subjective: No complaints  Discharge Exam: Vitals:   04/27/16 0617 04/27/16 1205  BP: (!) 146/56 (!) 164/59  Pulse: 84 81  Resp: 15 16  Temp: 98.5 F (36.9 C) 97.8 F (36.6 C)   Vitals:   04/26/16 1637 04/26/16 2201 04/27/16 0617 04/27/16 1205  BP:  (!) 122/57 (!) 146/56 (!) 164/59  Pulse: (!) 57 66 84 81  Resp: 12 17 15 16   Temp: 98.2 F (36.8 C) 98.6 F (37 C) 98.5 F (36.9 C) 97.8 F (36.6 C)  TempSrc: Oral Oral Oral Oral  SpO2: 100% 100% 100% 100%  Weight:      Height:        General: Pt is awake, not in acute distress Cardiovascular: RRR, S1/S2 +, no rubs, no gallops Respiratory: CTA bilaterally, no wheezing, no rhonchi Abdominal: Soft, NT, ND, bowel sounds + Extremities: no edema, no cyanosis   The results of significant diagnostics from this hospitalization (including imaging, microbiology, ancillary and laboratory) are listed below for reference.     Microbiology: Recent Results (from the past 240 hour(s))  Blood Culture (routine x 2)     Status: None (Preliminary result)   Collection Time: 04/25/16  1:46 PM  Result Value Ref Range Status   Specimen Description BLOOD LEFT HAND  Final   Special Requests BOTTLES DRAWN AEROBIC ONLY 4CC  Final   Culture NO GROWTH 2 DAYS  Final   Report Status PENDING  Incomplete  MRSA PCR Screening     Status: None   Collection Time: 04/25/16  4:00 PM  Result Value Ref Range Status   MRSA by PCR NEGATIVE NEGATIVE Final    Comment:        The GeneXpert MRSA Assay (FDA approved for NASAL  specimens only), is one component of a comprehensive MRSA colonization surveillance program. It is not intended to diagnose MRSA infection nor to guide or monitor treatment for MRSA infections.      Labs: BNP (last 3 results) No results for input(s): BNP in the last 8760 hours. Basic Metabolic Panel:  Recent Labs Lab 04/25/16 1330 04/26/16 0505 04/27/16 0700  NA 136 140 140  K 4.4 3.6 3.6  CL 101 111 112*  CO2 29 23 24   GLUCOSE 117* 95 95  BUN 34* 22* 13  CREATININE 2.40* 1.38* 0.95  CALCIUM 9.5 8.5* 8.7*   Liver Function Tests:  Recent Labs Lab 04/25/16 1330 04/26/16 0505  AST 15 12*  ALT 10* 8*  ALKPHOS 70 63  BILITOT 0.4 0.2*  PROT 6.8 5.9*  ALBUMIN 3.6 3.1*   No results for input(s): LIPASE, AMYLASE in the last 168 hours. No results for input(s): AMMONIA in the last 168 hours. CBC:  Recent Labs Lab 04/25/16 1330 04/26/16 0505  04/27/16 0700  WBC 9.9 6.7 5.8  NEUTROABS 7.9*  --   --   HGB 12.3 11.4* 11.8*  HCT 36.8 34.4* 34.5*  MCV 92.5 93.7 91.0  PLT 171 141* 100*   Cardiac Enzymes: No results for input(s): CKTOTAL, CKMB, CKMBINDEX, TROPONINI in the last 168 hours. BNP: Invalid input(s): POCBNP CBG: No results for input(s): GLUCAP in the last 168 hours. D-Dimer No results for input(s): DDIMER in the last 72 hours. Hgb A1c No results for input(s): HGBA1C in the last 72 hours. Lipid Profile No results for input(s): CHOL, HDL, LDLCALC, TRIG, CHOLHDL, LDLDIRECT in the last 72 hours. Thyroid function studies  Recent Labs  04/25/16 1241  TSH 0.584   Anemia work up No results for input(s): VITAMINB12, FOLATE, FERRITIN, TIBC, IRON, RETICCTPCT in the last 72 hours. Urinalysis    Component Value Date/Time   COLORURINE YELLOW 09/14/2015 1300   APPEARANCEUR CLEAR 09/14/2015 1300   LABSPEC 1.020 09/14/2015 1300   PHURINE 7.0 09/14/2015 1300   GLUCOSEU NEGATIVE 09/14/2015 1300   HGBUR TRACE (A) 09/14/2015 1300   BILIRUBINUR NEGATIVE  09/14/2015 1300   KETONESUR TRACE (A) 09/14/2015 1300   PROTEINUR NEGATIVE 09/14/2015 1300   UROBILINOGEN 0.2 10/07/2013 0406   NITRITE NEGATIVE 09/14/2015 1300   LEUKOCYTESUR TRACE (A) 09/14/2015 1300   Sepsis Labs Invalid input(s): PROCALCITONIN,  WBC,  LACTICIDVEN Microbiology Recent Results (from the past 240 hour(s))  Blood Culture (routine x 2)     Status: None (Preliminary result)   Collection Time: 04/25/16  1:46 PM  Result Value Ref Range Status   Specimen Description BLOOD LEFT HAND  Final   Special Requests BOTTLES DRAWN AEROBIC ONLY 4CC  Final   Culture NO GROWTH 2 DAYS  Final   Report Status PENDING  Incomplete  MRSA PCR Screening     Status: None   Collection Time: 04/25/16  4:00 PM  Result Value Ref Range Status   MRSA by PCR NEGATIVE NEGATIVE Final    Comment:        The GeneXpert MRSA Assay (FDA approved for NASAL specimens only), is one component of a comprehensive MRSA colonization surveillance program. It is not intended to diagnose MRSA infection nor to guide or monitor treatment for MRSA infections.      SIGNED:   Donne Hazel, MD  [facility] 04/27/2016, 12:21 PM  If 7PM-7AM, please contact night-coverage [email or web address] Password [email or web address]

## 2016-04-27 NOTE — Care Management Note (Signed)
Case Management Note  Patient Details  Name: [name] MRN: [id number] Date of Birth: 1930/07/14  Pt is from [place]. She is confused and requires assistance with ADL's at baseline. CSW is aware of facility placement and have made arrangements for return to facility today. PT has recommend HH PT. Facility have chosen [facility] from list of [facility] providers. Romualdo Bolk, of [facility], is aware of referral and will obtain pt info from chart. Facility it aware that [facility] has 48hrs to initiate services.   Expected Discharge Date:    04/27/2016              Expected Discharge Plan:  Assisted Living / [place] (with [place] services)  In-House Referral:  Clinical Social Work  Discharge planning Services  CM Consult  Post Acute Care Choice:  Home Health Choice offered to:  NA  DME Arranged:    DME Agency:     HH Arranged:  PT [place] Agency:  [place]  Status of Service:  Completed, signed off  Sherald Barge, RN 04/27/2016, 1:31 PM

## 2016-04-30 LAB — CULTURE, BLOOD (ROUTINE X 2): Culture: NO GROWTH

## 2016-06-18 ENCOUNTER — Encounter (HOSPITAL_COMMUNITY): Payer: Self-pay | Admitting: *Deleted

## 2016-06-18 ENCOUNTER — Emergency Department (HOSPITAL_COMMUNITY): Payer: Medicare Other

## 2016-06-18 ENCOUNTER — Emergency Department (HOSPITAL_COMMUNITY)
Admission: EM | Admit: 2016-06-18 | Discharge: 2016-06-19 | Disposition: A | Discharge status: Home / Self Care | Payer: Medicare Other | Attending: Emergency Medicine | Admitting: Emergency Medicine

## 2016-06-18 DIAGNOSIS — F0390 Unspecified dementia without behavioral disturbance: Secondary | ICD-10-CM | POA: Diagnosis not present

## 2016-06-18 DIAGNOSIS — R4182 Altered mental status, unspecified: Secondary | ICD-10-CM

## 2016-06-18 DIAGNOSIS — R1110 Vomiting, unspecified: Secondary | ICD-10-CM

## 2016-06-18 DIAGNOSIS — Z79899 Other long term (current) drug therapy: Secondary | ICD-10-CM | POA: Insufficient documentation

## 2016-06-18 DIAGNOSIS — I2510 Atherosclerotic heart disease of native coronary artery without angina pectoris: Secondary | ICD-10-CM | POA: Insufficient documentation

## 2016-06-18 DIAGNOSIS — R112 Nausea with vomiting, unspecified: Secondary | ICD-10-CM | POA: Diagnosis not present

## 2016-06-18 DIAGNOSIS — I10 Essential (primary) hypertension: Secondary | ICD-10-CM | POA: Insufficient documentation

## 2016-06-18 DIAGNOSIS — Z87891 Personal history of nicotine dependence: Secondary | ICD-10-CM | POA: Diagnosis not present

## 2016-06-18 LAB — I-STAT CG4 LACTIC ACID, ED: Lactic Acid, Venous: 0.81 mmol/L (ref 0.5–1.9)

## 2016-06-18 MED ORDER — SODIUM CHLORIDE 0.9 % IV BOLUS (SEPSIS)
500.0000 mL | Freq: Once | INTRAVENOUS | Status: AC
  Administered 2016-06-19: 500 mL via INTRAVENOUS

## 2016-06-18 NOTE — ED Notes (Addendum)
Per report pt sent from [place] after vomiting x 1 [phone]) Pt without complaint at this time and does not know why she was sent here

## 2016-06-18 NOTE — ED Provider Notes (Signed)
[facility] DEPT Provider Note   CSN: [id number] Arrival date & time: 06/18/16  2254  By signing my name below, I, [name], attest that this documentation has been prepared under the direction and in the presence of [name], MD . Electronically Signed: [name], Scribe. 06/18/2016. 11:06 PM.  History   Chief Complaint Chief Complaint  Patient presents with  . Emesis   The history is provided by the patient and medical records. The history is limited by the condition of the patient. No language interpreter was used.   LEVEL 5 CAVEAT DUE TO DEMENTIA   HPI Comments: [name] is a 82 y.o. female with a hx of Dementia and HTN who presents to the Emergency Department complaining of vomiting that started today. Per nurse's note, pt was brought in by EMS for one episode of vomiting, HTN, and unsteady gait. Per medical record, pt is from [facility] group home in [place]. Per [facility]) via phone [phone]), pt threw up after waking up, and she has confusion at baseline and ambulates with a walker at baseline. Pt denies any complaints but is a poor historian due to dementia symptoms.    PCP: [name], MD  Past Medical History:  Diagnosis Date  . Alzheimer's disease 09/11/2014  . Coronary artery disease   . Dementia   . High cholesterol   . Hypertension     Patient Active Problem List   Diagnosis Date Noted  . Hypotension 04/25/2016  . Diarrhea 04/25/2016  . Acute renal failure ([place]) 04/25/2016  . Acute renal failure (ARF) ([place]) 04/25/2016  . Alzheimer's disease 09/11/2014  . Chest pain 05/24/2012  . Hypertension 08/30/2011  . High cholesterol 08/30/2011    Past Surgical History:  Procedure Laterality Date  . CATARACT EXTRACTION W/PHACO  12/13/2011   Procedure: CATARACT EXTRACTION PHACO AND INTRAOCULAR LENS PLACEMENT (IOC);  Surgeon: [name], MD;  Location: [facility];  Service: Ophthalmology;  Laterality: Right;  CDE 12.67  .  CATARACT EXTRACTION W/PHACO  03/06/2012   Procedure: CATARACT EXTRACTION PHACO AND INTRAOCULAR LENS PLACEMENT (IOC);  Surgeon: [name], MD;  Location: [facility] ORS;  Service: Ophthalmology;  Laterality: Left;  CDE:13.12  . CHOLECYSTECTOMY    . COLONOSCOPY  09/28/2011   Procedure: COLONOSCOPY;  Surgeon: [name], MD;  Location: [facility] ENDO SUITE;  Service: Endoscopy;  Laterality: N/A;  1200    OB History    No data available       Home Medications    Prior to Admission medications   Medication Sig Start Date End Date Taking? Authorizing Provider  acetaminophen (TYLENOL) 325 MG tablet Take 650 mg by mouth every 6 (six) hours as needed.    Historical Provider, MD  ALPRAZolam [name]) 0.5 MG tablet Take 0.5 mg by mouth 3 (three) times daily. For anxiety    Historical Provider, MD  donepezil (ARICEPT) 10 MG tablet Take 1 tablet (10 mg total) by mouth at bedtime. 10/12/15   [name], NP  losartan (COZAAR) 100 MG tablet Take 100 mg by mouth daily.    Historical Provider, MD  memantine (NAMENDA) 10 MG tablet Take 1 tablet (10 mg total) by mouth 2 (two) times daily. 10/12/15   [name], NP  Multiple Vitamin (MULTIVITAMIN WITH MINERALS) TABS tablet Take 1 tablet by mouth daily.    Historical Provider, MD  potassium chloride SA (K-DUR,KLOR-CON) 20 MEQ tablet Take 20 mEq by mouth daily. Reported on 10/12/2015    Historical  Provider, MD  simvastatin (ZOCOR) 20 MG tablet Take 20 mg by mouth daily.    Historical Provider, MD  traMADol (ULTRAM) 50 MG tablet Take 50 mg by mouth every 6 (six) hours as needed.    Historical Provider, MD  traZODone (DESYREL) 50 MG tablet Take 0.5 tablets (25 mg total) by mouth at bedtime. 10/14/15   [name], NP    Family History Family History  Problem Relation Age of Onset  . Dementia Sister   . Dementia Brother     Social History Social History  Substance Use Topics  . Smoking status: Former Smoker    Packs/day: 0.25    Years: 10.00    Quit  date: 07/18/2007  . Smokeless tobacco: Never Used  . Alcohol use No     Allergies   Patient has no known allergies.   Review of Systems Review of Systems  Unable to perform ROS: Dementia     Physical Exam Updated Vital Signs BP 185/80 (BP Location: Left Arm)   Pulse 78   Temp 98.8 F (37.1 C)   Resp 15   Ht 5\' 5"  (1.651 m)   Wt 162 lb (73.5 kg)   SpO2 97%   BMI 26.96 kg/m   Physical Exam  Constitutional: She appears well-developed and well-nourished. No distress.  HENT:  Head: Normocephalic and atraumatic.  Mouth/Throat: Oropharynx is clear and moist. Mucous membranes are dry. No oropharyngeal exudate.  Eyes: Conjunctivae and EOM are normal. Pupils are equal, round, and reactive to light.  Neck: Normal range of motion. Neck supple.  No meningismus.  Cardiovascular: Normal rate, regular rhythm, normal heart sounds and intact distal pulses.   No murmur heard. Pulmonary/Chest: Effort normal and breath sounds normal. No respiratory distress.  Abdominal: Soft. There is no tenderness. There is no rebound and no guarding.  Musculoskeletal: Normal range of motion. She exhibits no edema or tenderness.  Neurological: She is alert. No cranial nerve deficit. She exhibits normal muscle tone. Coordination normal.  Limited exam due to dementia. Oriented to person and place. Unable to perform finger to nose test. Able to ambulate with assistance. 5/5 strength throughout. CN 2-12 intact. Equal grip strength.   Skin: Skin is warm.  Psychiatric: She has a normal mood and affect. Her behavior is normal.  Nursing note and vitals reviewed.    ED Treatments / Results  DIAGNOSTIC STUDIES: Oxygen Saturation is 97% on RA, normal by my interpretation.    COORDINATION OF CARE: 11:10 PM- Pt advised of plan for treatment and pt agrees.   Labs (all labs ordered are listed, but only abnormal results are displayed) Labs Reviewed  COMPREHENSIVE METABOLIC PANEL - Abnormal; Notable for the  following:       Result Value   Chloride 100 (*)    Glucose, Bld 141 (*)    ALT 11 (*)    GFR calc non Af Amer 52 (*)    All other components within normal limits  URINALYSIS, ROUTINE W REFLEX MICROSCOPIC (NOT AT [facility]) - Abnormal; Notable for the following:    Specific Gravity, Urine <1.005 (*)    Hgb urine dipstick TRACE (*)    All other components within normal limits  URINE MICROSCOPIC-ADD ON - Abnormal; Notable for the following:    Squamous Epithelial / LPF 0-5 (*)    Bacteria, UA RARE (*)    All other components within normal limits  CBC WITH DIFFERENTIAL/PLATELET  LIPASE, BLOOD  TROPONIN I  TROPONIN I  I-STAT CG4 LACTIC  ACID, ED    EKG  EKG Interpretation  Date/Time:  Saturday June 18 2016 23:04:48 EST Ventricular Rate:  75 PR Interval:    QRS Duration: 129 QT Interval:  426 QTC Calculation: 476 R Axis:   -101 Text Interpretation:  Sinus rhythm Prolonged PR interval RBBB and LAFB new RBBB stable TWI Confirmed by Wyvonnia Dusky  MD, Isak Sotomayor [phone]) on 06/18/2016 11:54:21 PM       Radiology Dg Chest 1 View  Result Date: 06/19/2016 CLINICAL DATA:  Vomiting EXAM: CHEST 1 VIEW COMPARISON:  04/25/2016 FINDINGS: Mild basilar atelectasis or scar. No acute consolidation. Stable cardiomegaly without overt failure. Atherosclerosis of the aorta. No pneumothorax. IMPRESSION: 1. Mild basilar atelectasis or scar 2. Stable cardiomegaly without overt failure 3. Atherosclerosis of the aorta Electronically Signed   By: Donavan Foil M.D.   On: 06/19/2016 00:23   Ct Head Wo Contrast  Result Date: 06/19/2016 CLINICAL DATA:  Initial evaluation for acute altered mental status. EXAM: CT HEAD WITHOUT CONTRAST TECHNIQUE: Contiguous axial images were obtained from the base of the skull through the vertex without intravenous contrast. COMPARISON:  Prior CT from 10/07/2013. FINDINGS: Brain: Mild diffuse prominence of the CSF containing spaces compatible with generalized cerebral atrophy. Chronic  microvascular ischemic changes present within the periventricular and deep white matter both cerebral hemispheres. No acute intracranial hemorrhage. No findings to suggest acute large vessel territory infarct. No mass lesion, midline shift, or mass effect. Ventricular prominence related to global parenchymal volume loss without hydrocephalus. No extra-axial fluid collection. Vascular: No hyperdense vessel. Skull: Scalp soft tissues within normal limits. Visualized calvarium intact. Sinuses/Orbits: Globes and oval soft tissues within normal limits. Patient is status post lens extraction bilaterally. Visualized paranasal sinuses are largely clear. No mastoid effusion. IMPRESSION: 1. No acute intracranial process. 2. Mild age-related cerebral atrophy with chronic microvascular disease. Electronically Signed   By: Jeannine Boga M.D.   On: 06/19/2016 00:26   Dg Abd 2 Views  Result Date: 06/19/2016 CLINICAL DATA:  Initial evaluation for acute vomiting. EXAM: ABDOMEN - 2 VIEW COMPARISON:  None available. FINDINGS: Bowel gas pattern within normal limits without evidence for obstruction or ileus. No abnormal bowel wall thickening. No free air identified. No soft tissue mass or abnormal calcification. Cholecystectomy clips noted. Visualized lung bases are clear. Diffuse osteopenia. Multilevel degenerative changes noted within the visualized spine. Degenerative changes noted about the hips is well. IMPRESSION: Nonobstructive bowel gas pattern with no radiographic evidence for acute intra-abdominal process. Electronically Signed   By: Jeannine Boga M.D.   On: 06/19/2016 01:19    Procedures Procedures (including critical care time)  Medications Ordered in ED Medications  sodium chloride 0.9 % bolus 500 mL (not administered)     Initial Impression / Assessment and Plan / ED Course  I have reviewed the triage vital signs and the nursing notes.  Pertinent labs & imaging results that were available  during my care of the patient were reviewed by me and considered in my medical decision making (see chart for details).  Clinical Course   Patient from living facility with episode of vomiting and elevated blood pressure. She has dementia and is not able to give any complaints. Denies chest pain or shortness of breath. Denies abdominal pain. Denies fever.  EKG with new RBBB, stable T wave inversions. Troponin negative. CT head negative. Labs reassuring.  UA negative.   X-ray shows no perforation or bowel obstruction. Patient given IV fluids. Patient did have 1 episode of collecting saliva in  her mouth and had to spit while in the ED. She did not have any frank emesis. Family states she has episodes of need to spit frequently.  Workup is reassuring. Troponin negative 2. Blood pressure has improved. Family feels patient is at her baseline. offered CT scan of abdomen for further evaluation which they declined at this time. Her abdomen is soft with no peritoneal signs, she denies abdominal pain and her lab work is reassuring including normal lactate. Low suspicion for acute surgical process. Two negative troponins.  They're comfortable with patient being discharged back to her facility. Return precautions discussed.  BP 143/63   Pulse 61   Temp 98.8 F (37.1 C)   Resp 15   Ht 5\' 5"  (1.651 m)   Wt 162 lb (73.5 kg)   SpO2 98%   BMI 26.96 kg/m    Final Clinical Impressions(s) / ED Diagnoses   Final diagnoses:  Vomiting  Nausea and vomiting, intractability of vomiting not specified, unspecified vomiting type    New Prescriptions New Prescriptions   No medications on file  I personally performed the services described in this documentation, which was scribed in my presence. The recorded information has been reviewed and is accurate.    [name], MD 06/19/16 [phone]

## 2016-06-18 NOTE — ED Triage Notes (Signed)
Pt brought in by [facility] for c/o vomiting and having HTN at the facility she lives; staff also report pt has unsteady gait that started today; pt is alert upon arrival and has no complaints

## 2016-06-18 NOTE — ED Notes (Signed)
Ambulated patient to restroom with standby assist. Patient had a urine saturated pull up on.  Patient given peri care and given a dry brief at this time. Patient assisted back to room and in bed.

## 2016-06-18 NOTE — ED Notes (Signed)
Pt in NAD, is unsure why she is here - She is ambulated to the bathroom where she is incontinent and cleaned.  EDP on phone with care home regarding why she is here and ascertaining more information

## 2016-06-19 ENCOUNTER — Emergency Department (HOSPITAL_COMMUNITY): Payer: Medicare Other

## 2016-06-19 LAB — COMPREHENSIVE METABOLIC PANEL
ALBUMIN: 4.2 g/dL (ref 3.5–5.0)
ALK PHOS: 79 U/L (ref 38–126)
ALT: 11 U/L — AB (ref 14–54)
AST: 19 U/L (ref 15–41)
Anion gap: 9 (ref 5–15)
BUN: 12 mg/dL (ref 6–20)
CALCIUM: 9.8 mg/dL (ref 8.9–10.3)
CO2: 27 mmol/L (ref 22–32)
CREATININE: 0.97 mg/dL (ref 0.44–1.00)
Chloride: 100 mmol/L — ABNORMAL LOW (ref 101–111)
GFR calc Af Amer: >60 mL/min (ref >60)
GFR calc non Af Amer: 52 mL/min — ABNORMAL LOW (ref >60)
GLUCOSE: 141 mg/dL — AB (ref 65–99)
Potassium: 4.3 mmol/L (ref 3.5–5.1)
SODIUM: 136 mmol/L (ref 135–145)
Total Bilirubin: 0.7 mg/dL (ref 0.3–1.2)
Total Protein: 7.5 g/dL (ref 6.5–8.1)

## 2016-06-19 LAB — CBC WITH DIFFERENTIAL/PLATELET
BASOS PCT: 0 %
Basophils Absolute: 0 10*3/uL (ref 0.0–0.1)
EOS ABS: 0 10*3/uL (ref 0.0–0.7)
Eosinophils Relative: 0 %
HCT: 36.3 % (ref 36.0–46.0)
HEMOGLOBIN: 12.2 g/dL (ref 12.0–15.0)
LYMPHS ABS: 0.8 10*3/uL (ref 0.7–4.0)
Lymphocytes Relative: 12 %
MCH: 31 pg (ref 26.0–34.0)
MCHC: 33.6 g/dL (ref 30.0–36.0)
MCV: 92.4 fL (ref 78.0–100.0)
Monocytes Absolute: 0.4 10*3/uL (ref 0.1–1.0)
Monocytes Relative: 6 %
NEUTROS PCT: 82 %
Neutro Abs: 5.9 10*3/uL (ref 1.7–7.7)
Platelets: 166 10*3/uL (ref 150–400)
RBC: 3.93 MIL/uL (ref 3.87–5.11)
RDW: 12.9 % (ref 11.5–15.5)
WBC: 7.1 10*3/uL (ref 4.0–10.5)

## 2016-06-19 LAB — TROPONIN I: Troponin I: <0.03 ng/mL (ref <0.03)

## 2016-06-19 LAB — URINE MICROSCOPIC-ADD ON

## 2016-06-19 LAB — LIPASE, BLOOD: Lipase: 37 U/L (ref 11–51)

## 2016-06-19 LAB — URINALYSIS, ROUTINE W REFLEX MICROSCOPIC
Bilirubin Urine: NEGATIVE
GLUCOSE, UA: NEGATIVE mg/dL
Ketones, ur: NEGATIVE mg/dL
Leukocytes, UA: NEGATIVE
Nitrite: NEGATIVE
Protein, ur: NEGATIVE mg/dL
pH: 7 (ref 5.0–8.0)

## 2016-06-19 MED ORDER — ONDANSETRON HCL 4 MG/2ML IJ SOLN
INTRAMUSCULAR | Status: AC
  Administered 2016-06-19: 4 mg
  Filled 2016-06-19: qty 2

## 2016-06-19 NOTE — ED Notes (Signed)
Sipping water

## 2016-06-19 NOTE — Discharge Instructions (Signed)
Your workup is reassuring.  Follow up with your doctor. Return to the ED if you develop new or worsening symptoms.

## 2016-06-19 NOTE — ED Notes (Signed)
Pt awakened and acted as if she was going to vomit vs spit- She was given both basin and bag and spit up yellowish bile/gatric contents in scant amount Dr to bedside for evaluation orders received

## 2016-06-19 NOTE — ED Notes (Signed)
From radiology, family at bedside

## 2016-06-19 NOTE — ED Notes (Signed)
from radiology

## 2016-06-19 NOTE — ED Notes (Signed)
Lab draw for 2nd troponi

## 2016-06-19 NOTE — ED Notes (Signed)
Per family, pt had her flu vaccine on WEds

## 2016-06-19 NOTE — ED Notes (Signed)
Patient transported to CT 

## 2017-04-11 ENCOUNTER — Emergency Department (HOSPITAL_COMMUNITY)
Admission: EM | Admit: 2017-04-11 | Discharge: 2017-04-11 | Disposition: A | Discharge status: Home / Self Care | Payer: Medicare Other | Attending: Emergency Medicine | Admitting: Emergency Medicine

## 2017-04-11 ENCOUNTER — Encounter (HOSPITAL_COMMUNITY): Payer: Self-pay

## 2017-04-11 ENCOUNTER — Emergency Department (HOSPITAL_COMMUNITY): Payer: Medicare Other

## 2017-04-11 DIAGNOSIS — R4182 Altered mental status, unspecified: Secondary | ICD-10-CM | POA: Diagnosis not present

## 2017-04-11 DIAGNOSIS — G309 Alzheimer's disease, unspecified: Secondary | ICD-10-CM | POA: Diagnosis not present

## 2017-04-11 DIAGNOSIS — Z87891 Personal history of nicotine dependence: Secondary | ICD-10-CM | POA: Diagnosis not present

## 2017-04-11 DIAGNOSIS — F0280 Dementia in other diseases classified elsewhere without behavioral disturbance: Secondary | ICD-10-CM | POA: Diagnosis not present

## 2017-04-11 DIAGNOSIS — I10 Essential (primary) hypertension: Secondary | ICD-10-CM | POA: Diagnosis not present

## 2017-04-11 DIAGNOSIS — I2510 Atherosclerotic heart disease of native coronary artery without angina pectoris: Secondary | ICD-10-CM | POA: Insufficient documentation

## 2017-04-11 DIAGNOSIS — Z79899 Other long term (current) drug therapy: Secondary | ICD-10-CM | POA: Insufficient documentation

## 2017-04-11 DIAGNOSIS — R509 Fever, unspecified: Secondary | ICD-10-CM | POA: Diagnosis present

## 2017-04-11 LAB — CBC WITH DIFFERENTIAL/PLATELET
Basophils Absolute: 0 10*3/uL (ref 0.0–0.1)
Basophils Relative: 0 %
Eosinophils Absolute: 0.1 10*3/uL (ref 0.0–0.7)
Eosinophils Relative: 1 %
HCT: 35.2 % — ABNORMAL LOW (ref 36.0–46.0)
Hemoglobin: 11.9 g/dL — ABNORMAL LOW (ref 12.0–15.0)
Lymphocytes Relative: 18 %
Lymphs Abs: 1.3 10*3/uL (ref 0.7–4.0)
MCH: 31.3 pg (ref 26.0–34.0)
MCHC: 33.8 g/dL (ref 30.0–36.0)
MCV: 92.6 fL (ref 78.0–100.0)
Monocytes Absolute: 0.6 10*3/uL (ref 0.1–1.0)
Monocytes Relative: 8 %
Neutro Abs: 5.3 10*3/uL (ref 1.7–7.7)
Neutrophils Relative %: 73 %
Platelets: 171 10*3/uL (ref 150–400)
RBC: 3.8 MIL/uL — ABNORMAL LOW (ref 3.87–5.11)
RDW: 12.9 % (ref 11.5–15.5)
WBC: 7.3 10*3/uL (ref 4.0–10.5)

## 2017-04-11 LAB — URINALYSIS, ROUTINE W REFLEX MICROSCOPIC
Bilirubin Urine: NEGATIVE
Glucose, UA: NEGATIVE mg/dL
Hgb urine dipstick: NEGATIVE
Ketones, ur: NEGATIVE mg/dL
Nitrite: NEGATIVE
Protein, ur: NEGATIVE mg/dL
Specific Gravity, Urine: 1.006 (ref 1.005–1.030)
pH: 7 (ref 5.0–8.0)

## 2017-04-11 LAB — BASIC METABOLIC PANEL
Anion gap: 7 (ref 5–15)
BUN: 14 mg/dL (ref 6–20)
CO2: 27 mmol/L (ref 22–32)
Calcium: 9 mg/dL (ref 8.9–10.3)
Chloride: 102 mmol/L (ref 101–111)
Creatinine, Ser: 1.03 mg/dL — ABNORMAL HIGH (ref 0.44–1.00)
GFR calc Af Amer: 56 mL/min — ABNORMAL LOW (ref >60)
GFR calc non Af Amer: 49 mL/min — ABNORMAL LOW (ref >60)
Glucose, Bld: 136 mg/dL — ABNORMAL HIGH (ref 65–99)
Potassium: 4 mmol/L (ref 3.5–5.1)
Sodium: 136 mmol/L (ref 135–145)

## 2017-04-11 NOTE — ED Triage Notes (Signed)
Pt arrived by [facility]. Initially thought pt was a code stroke. Slight head drift to the right when EMS got there. [facility] Did an NIH Stroke scale and it was negative. Pt felt hot to the touch, but ems could not get an accurate temp.   VSS CBG 149

## 2017-04-11 NOTE — ED Notes (Signed)
Pt back from CT

## 2017-05-03 NOTE — ED Provider Notes (Signed)
[facility] EMERGENCY DEPARTMENT Provider Note   CSN: [id number] Arrival date & time: 04/11/17  1653     History   Chief Complaint Chief Complaint  Patient presents with  . Fever    HPI [name] is a 83 y.o. female.  HPI   84 year old female brought in by EMS after facility felt like she was leaning to her right side. Felt warm to the touch. Afebrile. Patient himself has no acute complaints. Family at bedside. Reports that she seems to be at her baseline.  Past Medical History:  Diagnosis Date  . Alzheimer's disease 09/11/2014  . Coronary artery disease   . Dementia   . High cholesterol   . Hypertension     Patient Active Problem List   Diagnosis Date Noted  . Hypotension 04/25/2016  . Diarrhea 04/25/2016  . Acute renal failure ([place]) 04/25/2016  . Acute renal failure (ARF) ([place]) 04/25/2016  . Alzheimer's disease 09/11/2014  . Chest pain 05/24/2012  . Hypertension 08/30/2011  . High cholesterol 08/30/2011    Past Surgical History:  Procedure Laterality Date  . CATARACT EXTRACTION W/PHACO  12/13/2011   Procedure: CATARACT EXTRACTION PHACO AND INTRAOCULAR LENS PLACEMENT (IOC);  Surgeon: [name], MD;  Location: [facility];  Service: Ophthalmology;  Laterality: Right;  CDE 12.67  . CATARACT EXTRACTION W/PHACO  03/06/2012   Procedure: CATARACT EXTRACTION PHACO AND INTRAOCULAR LENS PLACEMENT (IOC);  Surgeon: [name], MD;  Location: [facility];  Service: Ophthalmology;  Laterality: Left;  CDE:13.12  . CHOLECYSTECTOMY    . COLONOSCOPY  09/28/2011   Procedure: COLONOSCOPY;  Surgeon: [name], MD;  Location: [facility];  Service: Endoscopy;  Laterality: N/A;  1200    OB History    No data available       Home Medications    Prior to Admission medications   Medication Sig Start Date End Date Taking? Authorizing Provider  acetaminophen (TYLENOL) 325 MG tablet Take 650 mg by mouth every 6 (six) hours as needed.   Yes [provider]    ALPRAZolam [name]) 0.5 MG tablet Take 0.5 mg by mouth 3 (three) times daily as needed for anxiety. For anxiety   Yes [provider]  donepezil (ARICEPT) 10 MG tablet Take 1 tablet (10 mg total) by mouth at bedtime. 10/12/15  Yes [name], NP  losartan (COZAAR) 100 MG tablet Take 100 mg by mouth daily.   Yes [provider]  memantine (NAMENDA) 10 MG tablet Take 1 tablet (10 mg total) by mouth 2 (two) times daily. 10/12/15  Yes [name], NP  Multiple Vitamin (MULTIVITAMIN WITH MINERALS) TABS tablet Take 1 tablet by mouth daily.   Yes [provider]  potassium chloride SA (K-DUR,KLOR-CON) 20 MEQ tablet Take 20 mEq by mouth daily. Reported on 10/12/2015   Yes [provider]  risperiDONE (RISPERDAL) 1 MG tablet Take 1 mg by mouth at bedtime.   Yes [provider]  simvastatin (ZOCOR) 20 MG tablet Take 20 mg by mouth daily.   Yes [provider]  traMADol (ULTRAM) 50 MG tablet Take 50 mg by mouth every 6 (six) hours as needed.    [provider]    Family History Family History  Problem Relation Age of Onset  . Dementia Sister   . Dementia Brother     Social History Social History  Substance Use Topics  . Smoking status: Former Smoker    Packs/day: 0.25    Years: 10.00  Quit date: 07/18/2007  . Smokeless tobacco: Never Used  . Alcohol use No     Allergies   Patient has no known allergies.   Review of Systems Review of Systems  All systems reviewed and negative, other than as noted in HPI.  Physical Exam Updated Vital Signs BP (!) 128/52   Pulse 68   Temp 98 F (36.7 C) (Rectal)   Resp 16   Ht 5\' 6"  (1.676 m)   Wt 77.1 kg (170 lb)   SpO2 100%   BMI 27.44 kg/m   Physical Exam  Constitutional: She appears well-developed and well-nourished. No distress.  HENT:  Head: Normocephalic and atraumatic.  Eyes: Conjunctivae are normal. Right eye exhibits no discharge. Left eye exhibits no  discharge.  Neck: Neck supple.  Cardiovascular: Normal rate, regular rhythm and normal heart sounds.  Exam reveals no gallop and no friction rub.   No murmur heard. Pulmonary/Chest: Effort normal and breath sounds normal. No respiratory distress.  Abdominal: Soft. She exhibits no distension. There is no tenderness.  Musculoskeletal: She exhibits no edema or tenderness.  Neurological: She is alert.  Exam limited by dementia. No focal motor deficits. Disoriented to time.  Skin: Skin is warm and dry.  Psychiatric: She has a normal mood and affect. Her behavior is normal. Thought content normal.  Nursing note and vitals reviewed.    ED Treatments / Results  Labs (all labs ordered are listed, but only abnormal results are displayed) Labs Reviewed  CBC WITH DIFFERENTIAL/PLATELET - Abnormal; Notable for the following:       Result Value   RBC 3.80 (*)    Hemoglobin 11.9 (*)    HCT 35.2 (*)    All other components within normal limits  BASIC METABOLIC PANEL - Abnormal; Notable for the following:    Glucose, Bld 136 (*)    Creatinine, Ser 1.03 (*)    GFR calc non Af Amer 49 (*)    GFR calc Af Amer 56 (*)    All other components within normal limits  URINALYSIS, ROUTINE W REFLEX MICROSCOPIC - Abnormal; Notable for the following:    Leukocytes, UA TRACE (*)    Bacteria, UA RARE (*)    Squamous Epithelial / LPF 0-5 (*)    All other components within normal limits    EKG  EKG Interpretation None       Radiology No results found.   Ct Head Wo Contrast  Result Date: 04/11/2017 CLINICAL DATA:  AMS Patient restrained during exam, patient unable to follow directions keeps rasing up during exam. Slight head drift to the right when EMS got there. [facility] Did an NIH Stroke scale and it was negative. EXAM: CT HEAD WITHOUT CONTRAST TECHNIQUE: Contiguous axial images were obtained from the base of the skull through the vertex without intravenous contrast. COMPARISON:  06/19/2016 FINDINGS:  Brain: There is moderate central and cortical atrophy. Periventricular white matter changes are consistent with small vessel disease. There is no intra or extra-axial fluid collection or mass lesion. The basilar cisterns and ventricles have a normal appearance. There is no CT evidence for acute infarction or hemorrhage. Vascular: No hyperdense vessel or unexpected calcification. Skull: Normal. Negative for fracture or focal lesion. Sinuses/Orbits: Paranasal sinuses are normally aerated. A small left mastoid effusion is present. Orbits are intact. Other: None IMPRESSION: 1. Atrophy and small vessel disease. 2.  No evidence for acute intracranial abnormality. 3. Small left mastoid effusion. Electronically Signed   By: Nolon Nations M.D.  On: 04/11/2017 18:52    Procedures Procedures (including critical care time)  Medications Ordered in ED Medications - No data to display   Initial Impression / Assessment and Plan / ED Course  I have reviewed the triage vital signs and the nursing notes.  Pertinent labs & imaging results that were available during my care of the patient were reviewed by me and considered in my medical decision making (see chart for details).     83 year old female with question change in mental status. Family at bedside reports that she seems better baseline. ED workup fairly unrevealing as a possible acute process. I doubt emergent process.  Final Clinical Impressions(s) / ED Diagnoses   Final diagnoses:  Altered mental status, unspecified altered mental status type    New Prescriptions Discharge Medication List as of 04/11/2017  8:19 PM       Virgel Manifold, MD 05/03/17 [phone]

## 2017-05-04 ENCOUNTER — Emergency Department (HOSPITAL_COMMUNITY): Payer: Medicare Other

## 2017-05-04 ENCOUNTER — Encounter (HOSPITAL_COMMUNITY): Payer: Self-pay | Admitting: Emergency Medicine

## 2017-05-04 ENCOUNTER — Observation Stay (HOSPITAL_COMMUNITY)
Admission: EM | Admit: 2017-05-04 | Discharge: 2017-05-06 | Disposition: A | Discharge status: Home / Self Care | Payer: Medicare Other | Attending: Emergency Medicine | Admitting: Emergency Medicine

## 2017-05-04 DIAGNOSIS — Z87891 Personal history of nicotine dependence: Secondary | ICD-10-CM | POA: Insufficient documentation

## 2017-05-04 DIAGNOSIS — F0280 Dementia in other diseases classified elsewhere without behavioral disturbance: Secondary | ICD-10-CM | POA: Insufficient documentation

## 2017-05-04 DIAGNOSIS — I2510 Atherosclerotic heart disease of native coronary artery without angina pectoris: Secondary | ICD-10-CM | POA: Insufficient documentation

## 2017-05-04 DIAGNOSIS — R0682 Tachypnea, not elsewhere classified: Secondary | ICD-10-CM | POA: Diagnosis not present

## 2017-05-04 DIAGNOSIS — R220 Localized swelling, mass and lump, head: Secondary | ICD-10-CM | POA: Insufficient documentation

## 2017-05-04 DIAGNOSIS — G309 Alzheimer's disease, unspecified: Secondary | ICD-10-CM | POA: Diagnosis not present

## 2017-05-04 DIAGNOSIS — I10 Essential (primary) hypertension: Secondary | ICD-10-CM | POA: Diagnosis not present

## 2017-05-04 DIAGNOSIS — R4182 Altered mental status, unspecified: Principal | ICD-10-CM | POA: Insufficient documentation

## 2017-05-04 DIAGNOSIS — G501 Atypical facial pain: Secondary | ICD-10-CM | POA: Insufficient documentation

## 2017-05-04 DIAGNOSIS — R531 Weakness: Secondary | ICD-10-CM | POA: Diagnosis not present

## 2017-05-04 DIAGNOSIS — Z79899 Other long term (current) drug therapy: Secondary | ICD-10-CM | POA: Diagnosis not present

## 2017-05-04 LAB — CBC
HCT: 35.7 % — ABNORMAL LOW (ref 36.0–46.0)
Hemoglobin: 12.1 g/dL (ref 12.0–15.0)
MCH: 31.5 pg (ref 26.0–34.0)
MCHC: 33.9 g/dL (ref 30.0–36.0)
MCV: 93 fL (ref 78.0–100.0)
Platelets: 187 10*3/uL (ref 150–400)
RBC: 3.84 MIL/uL — AB (ref 3.87–5.11)
RDW: 12.8 % (ref 11.5–15.5)
WBC: 14.4 10*3/uL — AB (ref 4.0–10.5)

## 2017-05-04 LAB — COMPREHENSIVE METABOLIC PANEL
ALBUMIN: 3.4 g/dL — AB (ref 3.5–5.0)
ALK PHOS: 78 U/L (ref 38–126)
ALT: 12 U/L — AB (ref 14–54)
AST: 21 U/L (ref 15–41)
Anion gap: 10 (ref 5–15)
BUN: 18 mg/dL (ref 6–20)
CALCIUM: 8.9 mg/dL (ref 8.9–10.3)
CO2: 24 mmol/L (ref 22–32)
Chloride: 102 mmol/L (ref 101–111)
Creatinine, Ser: 0.95 mg/dL (ref 0.44–1.00)
GFR calc Af Amer: >60 mL/min (ref >60)
GFR calc non Af Amer: 53 mL/min — ABNORMAL LOW (ref >60)
GLUCOSE: 115 mg/dL — AB (ref 65–99)
Potassium: 4.5 mmol/L (ref 3.5–5.1)
SODIUM: 136 mmol/L (ref 135–145)
Total Bilirubin: 0.6 mg/dL (ref 0.3–1.2)
Total Protein: 7.3 g/dL (ref 6.5–8.1)

## 2017-05-04 LAB — I-STAT CHEM 8, ED
BUN: 21 mg/dL — AB (ref 6–20)
CALCIUM ION: 1.09 mmol/L — AB (ref 1.15–1.40)
CHLORIDE: 102 mmol/L (ref 101–111)
CREATININE: 0.9 mg/dL (ref 0.44–1.00)
Glucose, Bld: 116 mg/dL — ABNORMAL HIGH (ref 65–99)
HCT: 35 % — ABNORMAL LOW (ref 36.0–46.0)
Hemoglobin: 11.9 g/dL — ABNORMAL LOW (ref 12.0–15.0)
Potassium: 4.8 mmol/L (ref 3.5–5.1)
SODIUM: 136 mmol/L (ref 135–145)
TCO2: 27 mmol/L (ref 22–32)

## 2017-05-04 LAB — URINALYSIS, ROUTINE W REFLEX MICROSCOPIC
Bilirubin Urine: NEGATIVE
Glucose, UA: NEGATIVE mg/dL
KETONES UR: NEGATIVE mg/dL
Leukocytes, UA: NEGATIVE
NITRITE: NEGATIVE
PH: 6 (ref 5.0–8.0)
Protein, ur: NEGATIVE mg/dL
Specific Gravity, Urine: 1.019 (ref 1.005–1.030)

## 2017-05-04 LAB — BRAIN NATRIURETIC PEPTIDE: B NATRIURETIC PEPTIDE 5: 43 pg/mL (ref 0.0–100.0)

## 2017-05-04 LAB — DIFFERENTIAL
Basophils Absolute: 0 10*3/uL (ref 0.0–0.1)
Basophils Relative: 0 %
Eosinophils Absolute: 0 10*3/uL (ref 0.0–0.7)
Eosinophils Relative: 0 %
LYMPHS PCT: 9 %
Lymphs Abs: 1.3 10*3/uL (ref 0.7–4.0)
Monocytes Absolute: 1.3 10*3/uL — ABNORMAL HIGH (ref 0.1–1.0)
Monocytes Relative: 9 %
NEUTROS ABS: 11.8 10*3/uL — AB (ref 1.7–7.7)
NEUTROS PCT: 82 %

## 2017-05-04 LAB — PROTIME-INR
INR: 1.07
PROTHROMBIN TIME: 13.8 s (ref 11.4–15.2)

## 2017-05-04 LAB — RAPID URINE DRUG SCREEN, HOSP PERFORMED
Amphetamines: NOT DETECTED
Barbiturates: NOT DETECTED
Benzodiazepines: NOT DETECTED
Cocaine: NOT DETECTED
OPIATES: NOT DETECTED
TETRAHYDROCANNABINOL: NOT DETECTED

## 2017-05-04 LAB — I-STAT TROPONIN, ED: Troponin i, poc: 0.01 ng/mL (ref 0.00–0.08)

## 2017-05-04 LAB — ETHANOL: Alcohol, Ethyl (B): <10 mg/dL (ref <10)

## 2017-05-04 LAB — APTT: aPTT: 32 seconds (ref 24–36)

## 2017-05-04 MED ORDER — AZITHROMYCIN 500 MG IV SOLR
500.0000 mg | Freq: Once | INTRAVENOUS | Status: AC
  Administered 2017-05-05: 500 mg via INTRAVENOUS
  Filled 2017-05-04: qty 500

## 2017-05-04 MED ORDER — DEXTROSE 5 % IV SOLN
1.0000 g | Freq: Once | INTRAVENOUS | Status: AC
  Administered 2017-05-04: 1 g via INTRAVENOUS
  Filled 2017-05-04: qty 10

## 2017-05-04 MED ORDER — SODIUM CHLORIDE 0.9 % IV BOLUS (SEPSIS)
1000.0000 mL | Freq: Once | INTRAVENOUS | Status: AC
  Administered 2017-05-04: 1000 mL via INTRAVENOUS

## 2017-05-04 NOTE — ED Triage Notes (Signed)
Pt pulled from car to a room, MD and family at the bedside. Pt caregiver, pt unable to walk this morning, right side face droop.,  Pt has dementia, pt will not follow commands.

## 2017-05-04 NOTE — ED Provider Notes (Signed)
Emergency Department Provider Note   I have reviewed the triage vital signs and the nursing notes.   HISTORY  Chief Complaint Weakness   HPI [name] is a 83 y.o. female with a history of Alzheimer's, coronary artery disease, hyperlipidemia and hypertension presents to the emergency department today with generalized weakness. After further information gathering and speaking with thenurse where she lives and speak with a sinus sounds at the patient started having generalized weakness and shuffling gait yesterday evening. This morning she was difficult to get out of bed secondary to the same. They noticed the right side of her face seemed to be swollen as well.son states he thinks that her right side of her face appears to be drooping which is not normal for her. He states that her speech seems to be intact.Her dementia is severe she is unable to provide any other history. The nurse at the facility states that her urine is darker than normal but does not have an odor. She's been eating and drinking normally there without any fevers, cough, complaints of abdominal pain, chest pain or back pain.  LEVEL V CAVEAT Secondary to Dementia  Past Medical History:  Diagnosis Date  . Alzheimer's disease 09/11/2014  . Coronary artery disease   . Dementia   . High cholesterol   . Hypertension     Patient Active Problem List   Diagnosis Date Noted  . Hypotension 04/25/2016  . Diarrhea 04/25/2016  . Acute renal failure ([place]) 04/25/2016  . Acute renal failure (ARF) ([place]) 04/25/2016  . Alzheimer's disease 09/11/2014  . Chest pain 05/24/2012  . Hypertension 08/30/2011  . High cholesterol 08/30/2011    Past Surgical History:  Procedure Laterality Date  . CATARACT EXTRACTION W/PHACO  12/13/2011   Procedure: CATARACT EXTRACTION PHACO AND INTRAOCULAR LENS PLACEMENT (IOC);  Surgeon: [name], MD;  Location: [facility];  Service: Ophthalmology;  Laterality: Right;  CDE 12.67  .  CATARACT EXTRACTION W/PHACO  03/06/2012   Procedure: CATARACT EXTRACTION PHACO AND INTRAOCULAR LENS PLACEMENT (IOC);  Surgeon: [name], MD;  Location: [facility];  Service: Ophthalmology;  Laterality: Left;  CDE:13.12  . CHOLECYSTECTOMY    . COLONOSCOPY  09/28/2011   Procedure: COLONOSCOPY;  Surgeon: [name], MD;  Location: [facility];  Service: Endoscopy;  Laterality: N/A;  1200    Current Outpatient Rx  . Order #: [id number] Class: Historical Med  . Order #: [id number] Class: Historical Med  . Order #: [id number] Class: Normal  . Order #: [id number] Class: Historical Med  . Order #: [id number] Class: Normal  . Order #: [id number] Class: Historical Med  . Order #: [id number] Class: Historical Med  . Order #: [id number] Class: Historical Med  . Order #: [id number] Class: Historical Med    Allergies Patient has no known allergies.  Family History  Problem Relation Age of Onset  . Dementia Sister   . Dementia Brother     Social History Social History  Substance Use Topics  . Smoking status: Former Smoker    Packs/day: 0.25    Years: 10.00    Quit date: 07/18/2007  . Smokeless tobacco: Never Used  . Alcohol use No    Review of Systems  LEVEL V CAVEAT Secondary to Dementia ____________________________________________   PHYSICAL EXAM:  VITAL SIGNS: Vitals:   05/04/17 1830 05/04/17 1911  BP: (!) 147/66 (!) 145/57  Pulse:  82  Resp:  19  SpO2:  96%    Constitutional: Alert and oriented. Well appearing and in no acute distress.  Eyes: Conjunctivae are normal. PERRL. EOMI. Head: Atraumatic. Nose: No congestion/rhinnorhea. Mouth/Throat: Mucous membranes are moist.  Oropharynx non-erythematous. Right sided cheek/lower mandibular swelling with tenderness but no fluctuance but some warmth and mild erythema. Neck: No stridor.  No meningeal signs.   Cardiovascular: Normal rate, regular rhythm. Good peripheral circulation. II/VI holosystolic heart murmur.   Respiratory:  slightly tachypneic respiratory effort.  No retractions. Lungs with rhonchi but no asymmetric sounds. Gastrointestinal: Soft and nontender. No distention.  Musculoskeletal: No lower extremity tenderness nor edema. No gross deformities of extremities. Neurologic:  Normal speech and language. Generalized weakness, but no gross focal neurologic deficits are appreciated.  Skin:  Skin is warm, dry and intact. No rash noted.  ____________________________________________   LABS (all labs ordered are listed, but only abnormal results are displayed)  Labs Reviewed  CBC - Abnormal; Notable for the following:       Result Value   WBC 14.4 (*)    RBC 3.84 (*)    HCT 35.7 (*)    All other components within normal limits  DIFFERENTIAL - Abnormal; Notable for the following:    Neutro Abs 11.8 (*)    Monocytes Absolute 1.3 (*)    All other components within normal limits  COMPREHENSIVE METABOLIC PANEL - Abnormal; Notable for the following:    Glucose, Bld 115 (*)    Albumin 3.4 (*)    ALT 12 (*)    GFR calc non Af Amer 53 (*)    All other components within normal limits  URINALYSIS, ROUTINE W REFLEX MICROSCOPIC - Abnormal; Notable for the following:    APPearance HAZY (*)    Hgb urine dipstick SMALL (*)    Bacteria, UA RARE (*)    Squamous Epithelial / LPF 0-5 (*)    All other components within normal limits  I-STAT CHEM 8, ED - Abnormal; Notable for the following:    BUN 21 (*)    Glucose, Bld 116 (*)    Calcium, Ion 1.09 (*)    Hemoglobin 11.9 (*)    HCT 35.0 (*)    All other components within normal limits  ETHANOL  PROTIME-INR  APTT  RAPID URINE DRUG SCREEN, HOSP PERFORMED  BRAIN NATRIURETIC PEPTIDE  I-STAT TROPONIN, ED   ____________________________________________  EKG   EKG Interpretation  Date/Time:    Ventricular Rate:    PR Interval:    QRS Duration:   QT Interval:    QTC Calculation:   R Axis:     Text Interpretation:          ____________________________________________  RADIOLOGY  Dg Chest 2 View  Result Date: 05/04/2017 CLINICAL DATA:  Facial drooping hypertension EXAM: CHEST  2 VIEW COMPARISON:  06/19/2016, 04/25/2016 FINDINGS: No large pleural effusion. Mild kyphosis of the spine with osteopenia and degenerative changes. Increased left greater than right bibasilar opacity. Stable enlarged cardiomediastinal silhouette with atherosclerosis. No pneumothorax. IMPRESSION: 1. Slight increased left greater than right bibasilar opacity, cannot exclude acute infiltrate superimposed on chronic changes. 2. Stable cardiomegaly. Electronically Signed   By: Donavan Foil M.D.   On: 05/04/2017 20:06   Ct Head Wo Contrast  Result Date: 05/04/2017 CLINICAL DATA:  Facial droop and altered LOC EXAM: CT HEAD WITHOUT CONTRAST CT MAXILLOFACIAL WITHOUT CONTRAST TECHNIQUE: Multidetector CT imaging of the head and maxillofacial structures were performed using the standard protocol without intravenous contrast. Multiplanar CT image reconstructions of the maxillofacial structures were also generated. COMPARISON:  04/11/2017, MRI 12/06/2010 FINDINGS: CT HEAD FINDINGS Brain: No acute  territorial infarction, hemorrhage or intracranial mass is visualized. Moderate atrophy. Mild small vessel ischemic changes of the white matter. Stable ventricle size. Vascular: No hyperdense vessels.  Carotid artery calcification. Skull: No fracture or suspicious lesion Other: None CT MAXILLOFACIAL FINDINGS Osseous: Minimal anterior positioning of the mandibular heads with respect to the mandibular fossa. No mandibular fracture is seen. No nasal bone fracture. Pterygoid plates and zygomatic arches are intact. Small fluid in the left mastoid air cells. Orbits: Negative. No traumatic or inflammatory finding. Sinuses: Clear. Soft tissues: Mild to moderate right lower facial soft tissue swelling. No focal fluid collections. IMPRESSION: 1. No CT evidence for acute  intracranial abnormality. Atrophy and small vessel ischemic changes of the white matter 2. No acute facial bone fracture. 3. Moderate right lower facial soft tissue swelling, could relate to trauma or cellulitis. No focal fluid collections are seen in this region. Electronically Signed   By: Donavan Foil M.D.   On: 05/04/2017 20:46   Ct Maxillofacial Wo Contrast  Result Date: 05/04/2017 CLINICAL DATA:  Facial droop and altered LOC EXAM: CT HEAD WITHOUT CONTRAST CT MAXILLOFACIAL WITHOUT CONTRAST TECHNIQUE: Multidetector CT imaging of the head and maxillofacial structures were performed using the standard protocol without intravenous contrast. Multiplanar CT image reconstructions of the maxillofacial structures were also generated. COMPARISON:  04/11/2017, MRI 12/06/2010 FINDINGS: CT HEAD FINDINGS Brain: No acute territorial infarction, hemorrhage or intracranial mass is visualized. Moderate atrophy. Mild small vessel ischemic changes of the white matter. Stable ventricle size. Vascular: No hyperdense vessels.  Carotid artery calcification. Skull: No fracture or suspicious lesion Other: None CT MAXILLOFACIAL FINDINGS Osseous: Minimal anterior positioning of the mandibular heads with respect to the mandibular fossa. No mandibular fracture is seen. No nasal bone fracture. Pterygoid plates and zygomatic arches are intact. Small fluid in the left mastoid air cells. Orbits: Negative. No traumatic or inflammatory finding. Sinuses: Clear. Soft tissues: Mild to moderate right lower facial soft tissue swelling. No focal fluid collections. IMPRESSION: 1. No CT evidence for acute intracranial abnormality. Atrophy and small vessel ischemic changes of the white matter 2. No acute facial bone fracture. 3. Moderate right lower facial soft tissue swelling, could relate to trauma or cellulitis. No focal fluid collections are seen in this region. Electronically Signed   By: Donavan Foil M.D.   On: 05/04/2017 20:46     ____________________________________________   PROCEDURES  Procedure(s) performed:   Procedures   ____________________________________________   INITIAL IMPRESSION / ASSESSMENT AND PLAN / ED COURSE  Pertinent labs & imaging results that were available during my care of the patient were reviewed by me and considered in my medical decision making (see chart for details).  Infection vs cva vs cardiac? Will eval appropriately. No obvious focal deficit.   Workup c/w likely cellulitis vs less likely PNA. Doubt CVA. Leukocytosis. Will treat with rocephin/azithromycin.  Still not acting like self. Will admit for same. Discussed with Dr. Darrick Meigs who will admit for same.  ____________________________________________  FINAL CLINICAL IMPRESSION(S) / ED DIAGNOSES  Final diagnoses:  Altered mental status, unspecified altered mental status type  Weakness    MEDICATIONS GIVEN DURING THIS VISIT:  Medications  cefTRIAXone (ROCEPHIN) 1 g in dextrose 5 % 50 mL IVPB (not administered)  azithromycin (ZITHROMAX) 500 mg in dextrose 5 % 250 mL IVPB (not administered)  sodium chloride 0.9 % bolus 1,000 mL (1,000 mLs Intravenous New Bag/Given 05/04/17 1848)     NEW OUTPATIENT MEDICATIONS STARTED DURING THIS VISIT:  New Prescriptions   No  medications on file    Note:  This document was prepared using Dragon voice recognition software and may include unintentional dictation errors.   Merrily Pew, MD 05/05/17 1002

## 2017-05-05 ENCOUNTER — Observation Stay (HOSPITAL_COMMUNITY): Payer: Medicare Other

## 2017-05-05 DIAGNOSIS — R531 Weakness: Secondary | ICD-10-CM | POA: Diagnosis not present

## 2017-05-05 DIAGNOSIS — R220 Localized swelling, mass and lump, head: Secondary | ICD-10-CM | POA: Diagnosis present

## 2017-05-05 LAB — CBC
HEMATOCRIT: 32 % — AB (ref 36.0–46.0)
HEMOGLOBIN: 10.6 g/dL — AB (ref 12.0–15.0)
MCH: 31.1 pg (ref 26.0–34.0)
MCHC: 33.1 g/dL (ref 30.0–36.0)
MCV: 93.8 fL (ref 78.0–100.0)
Platelets: 179 10*3/uL (ref 150–400)
RBC: 3.41 MIL/uL — ABNORMAL LOW (ref 3.87–5.11)
RDW: 12.3 % (ref 11.5–15.5)
WBC: 11.1 10*3/uL — ABNORMAL HIGH (ref 4.0–10.5)

## 2017-05-05 LAB — COMPREHENSIVE METABOLIC PANEL
ALBUMIN: 2.8 g/dL — AB (ref 3.5–5.0)
ALK PHOS: 62 U/L (ref 38–126)
ALT: 11 U/L — AB (ref 14–54)
AST: 14 U/L — AB (ref 15–41)
Anion gap: 8 (ref 5–15)
BILIRUBIN TOTAL: 0.6 mg/dL (ref 0.3–1.2)
BUN: 13 mg/dL (ref 6–20)
CALCIUM: 8.7 mg/dL — AB (ref 8.9–10.3)
CO2: 25 mmol/L (ref 22–32)
CREATININE: 0.79 mg/dL (ref 0.44–1.00)
Chloride: 105 mmol/L (ref 101–111)
GFR calc Af Amer: >60 mL/min (ref >60)
GFR calc non Af Amer: >60 mL/min (ref >60)
GLUCOSE: 96 mg/dL (ref 65–99)
Potassium: 3.6 mmol/L (ref 3.5–5.1)
SODIUM: 138 mmol/L (ref 135–145)
TOTAL PROTEIN: 6.2 g/dL — AB (ref 6.5–8.1)

## 2017-05-05 MED ORDER — DONEPEZIL HCL 5 MG PO TABS
10.0000 mg | ORAL_TABLET | Freq: Every day | ORAL | Status: DC
  Administered 2017-05-05: 10 mg via ORAL
  Filled 2017-05-05 (×3): qty 1
  Filled 2017-05-05: qty 2

## 2017-05-05 MED ORDER — LOSARTAN POTASSIUM 50 MG PO TABS
100.0000 mg | ORAL_TABLET | Freq: Every day | ORAL | Status: DC
  Administered 2017-05-06: 100 mg via ORAL
  Filled 2017-05-05 (×5): qty 2

## 2017-05-05 MED ORDER — POTASSIUM CHLORIDE CRYS ER 20 MEQ PO TBCR
20.0000 meq | EXTENDED_RELEASE_TABLET | Freq: Every day | ORAL | Status: DC
  Filled 2017-05-05: qty 1

## 2017-05-05 MED ORDER — RISPERIDONE 1 MG PO TABS
1.0000 mg | ORAL_TABLET | Freq: Every day | ORAL | Status: DC
  Administered 2017-05-05: 1 mg via ORAL
  Filled 2017-05-05 (×4): qty 1

## 2017-05-05 MED ORDER — SACCHAROMYCES BOULARDII 250 MG PO CAPS
250.0000 mg | ORAL_CAPSULE | Freq: Two times a day (BID) | ORAL | Status: DC
  Administered 2017-05-05 – 2017-05-06 (×2): 250 mg via ORAL
  Filled 2017-05-05 (×9): qty 1

## 2017-05-05 MED ORDER — ALPRAZOLAM 0.5 MG PO TABS: 0.5000 mg | ORAL_TABLET | Freq: Three times a day (TID) | ORAL | Status: DC | PRN

## 2017-05-05 MED ORDER — ENOXAPARIN SODIUM 40 MG/0.4ML ~~LOC~~ SOLN
40.0000 mg | SUBCUTANEOUS | Status: DC
  Administered 2017-05-06: 40 mg via SUBCUTANEOUS
  Filled 2017-05-05: qty 0.4

## 2017-05-05 MED ORDER — MEMANTINE HCL 10 MG PO TABS
10.0000 mg | ORAL_TABLET | Freq: Two times a day (BID) | ORAL | Status: DC
  Administered 2017-05-05 – 2017-05-06 (×2): 10 mg via ORAL
  Filled 2017-05-05 (×9): qty 1

## 2017-05-05 MED ORDER — ONDANSETRON HCL 4 MG PO TABS: 4.0000 mg | ORAL_TABLET | Freq: Four times a day (QID) | ORAL | Status: DC | PRN

## 2017-05-05 MED ORDER — ONDANSETRON HCL 4 MG/2ML IJ SOLN: 4.0000 mg | Freq: Four times a day (QID) | INTRAMUSCULAR | Status: DC | PRN

## 2017-05-05 MED ORDER — SODIUM CHLORIDE 0.9 % IV SOLN
INTRAVENOUS | Status: DC
  Administered 2017-05-06: 06:00:00 via INTRAVENOUS

## 2017-05-05 MED ORDER — SIMVASTATIN 20 MG PO TABS
20.0000 mg | ORAL_TABLET | Freq: Every day | ORAL | Status: DC
  Administered 2017-05-06: 20 mg via ORAL
  Filled 2017-05-05: qty 1

## 2017-05-05 MED ORDER — CLINDAMYCIN PHOSPHATE 600 MG/50ML IV SOLN
600.0000 mg | Freq: Three times a day (TID) | INTRAVENOUS | Status: DC
  Administered 2017-05-05 – 2017-05-06 (×4): 600 mg via INTRAVENOUS
  Filled 2017-05-05 (×10): qty 50

## 2017-05-05 MED ORDER — ACETAMINOPHEN 325 MG PO TABS: 325.0000 mg | ORAL_TABLET | Freq: Four times a day (QID) | ORAL | Status: DC | PRN

## 2017-05-05 MED ORDER — ENOXAPARIN SODIUM 30 MG/0.3ML ~~LOC~~ SOLN
30.0000 mg | SUBCUTANEOUS | Status: DC
  Administered 2017-05-05: 30 mg via SUBCUTANEOUS
  Filled 2017-05-05: qty 0.3

## 2017-05-05 NOTE — ED Provider Notes (Addendum)
Pt with difficulty ambulating She has difficulty raising left LE and generalized weakness After d/w dr Darrick Meigs, plan to continue with admission, and get MRI in the AM      Ripley Fraise, MD 05/05/17 [facility]    Ripley Fraise, MD 05/05/17 [phone]

## 2017-05-05 NOTE — Care Management Obs Status (Signed)
[facility] NOTIFICATION   Patient Details  Name: BEYONCA WISZ MRN: [id number] Date of Birth: 07/14/1930   Medicare Observation Status Notification Given:  Yes    Sherald Barge, RN 05/05/2017, 4:16 PM

## 2017-05-05 NOTE — H&P (Signed)
TRH H&P    Patient Demographics:    [name], is a 83 y.o. female  MRN: [id number]  DOB - [birth year]  Admit Date - 05/04/2017  Referring MD/NP/PA: Dr Dolly Rias  Outpatient Primary MD for the patient is Celene Squibb, MD  Patient coming from: [place]     Chief Complaint  Patient presents with  . Weakness      HPI:    [name]  is a 84 y.o. female, with history of dementia, exam is disease, CAD, hyperlipidemia, hypertension was brought to the emergency room. Chief complaint of right-sided facial swelling, and difficulty walking. Patient was recently seen by dentist, and was told that she might need extraction on left side. She has only 2 , right and left upper molar teeth left as per caregiver. There has been no symptoms of chest pain, no shortness of breath. No nausea vomiting or diarrhea. No dysuria urgency or frequency of urination.  Patient had similar facial swelling on 04/11/2017 when she was seen in the ED.     Review of systems:     All other systems reviewed and are negative.   With Past History of the following :        Past Medical History:  Diagnosis Date  . Alzheimer's disease 09/11/2014  . Coronary artery disease   . Dementia   . High cholesterol   . Hypertension            Past Surgical History:  Procedure Laterality Date  . CATARACT EXTRACTION W/PHACO  12/13/2011   Procedure: CATARACT EXTRACTION PHACO AND INTRAOCULAR LENS PLACEMENT (IOC);  Surgeon: [name], MD;  Location: [facility];  Service: Ophthalmology;  Laterality: Right;  CDE 12.67  . CATARACT EXTRACTION W/PHACO  03/06/2012   Procedure: CATARACT EXTRACTION PHACO AND INTRAOCULAR LENS PLACEMENT (IOC);  Surgeon: [name], MD;  Location: [facility];  Service: Ophthalmology;  Laterality: Left;  CDE:13.12  . CHOLECYSTECTOMY    . COLONOSCOPY  09/28/2011   Procedure: COLONOSCOPY;  Surgeon: [name], MD;  Location: [facility];  Service: Endoscopy;  Laterality: N/A;  1200      Social History:           Social History  Substance Use Topics  . Smoking status: Former Smoker    Packs/day: 0.25    Years: 10.00    Quit date: 07/18/2007  . Smokeless tobacco: Never Used  . Alcohol use No       Family History :          Family History  Problem Relation Age of Onset  . Dementia Sister   . Dementia Brother       Home Medications:          Prior to Admission medications   Medication Sig Start Date End Date Taking? Authorizing Provider  acetaminophen (TYLENOL) 325 MG tablet Take 325-650 mg by mouth every 6 (six) hours as needed.    Yes [provider]  ALPRAZolam [name]) 0.5 MG tablet Take 0.5 mg by mouth 3 (three) times daily as needed for anxiety. For anxiety   Yes [provider]  donepezil (ARICEPT) 10 MG tablet Take 1 tablet (10 mg total) by mouth at bedtime. 10/12/15  Yes [name], NP  losartan (COZAAR) 100 MG tablet Take 100 mg by mouth daily.   Yes [provider]  memantine (NAMENDA) 10 MG tablet Take 1 tablet (10 mg total) by mouth 2 (two) times daily. 10/12/15  Yes  [name], NP  Multiple Vitamin (MULTIVITAMIN WITH MINERALS) TABS tablet Take 1 tablet by mouth daily.   Yes [provider]  potassium chloride SA (K-DUR,KLOR-CON) 20 MEQ tablet Take 20 mEq by mouth daily. Reported on 10/12/2015   Yes [provider]  risperiDONE (RISPERDAL) 1 MG tablet Take 1 mg by mouth at bedtime.   Yes [provider]  simvastatin (ZOCOR) 20 MG tablet Take 20 mg by mouth daily.   Yes [provider]     Allergies:    No Known Allergies   Physical Exam:   Vitals  Blood pressure (!) 111/54, pulse 72, resp. rate 18, height 5\' 6"  (1.676 m), weight 77.1 kg (170 lb), SpO2 98 %.  1.  General: Appears in no acute distress  2. Psychiatric:  Intact judgement and  insight, awake alert,  oriented x 3.  3. Neurologic: No focal neurological deficits, all cranial nerves intact.Strength is 5/5 in both upper and lower extremities, no pronator drift noted.  4. Eyes :  anicteric sclerae, moist conjunctivae with no lid lag. PERRLA.  5. ENMT:  Oropharynx clear, patient has significant right cheek swelling, tenderness noted on palpation of right upper molar,surrounding gum tissue.  6. Neck:  supple, no cervical lymphadenopathy appriciated, No thyromegaly  7. Respiratory : Normal respiratory effort, good air movement bilaterally,clear to  auscultation bilaterally  8. Cardiovascular : RRR, no gallops, rubs or murmurs, no leg edema  9. Gastrointestinal:  Positive bowel sounds, abdomen soft, non-tender to palpation,no hepatosplenomegaly, no rigidity or guarding       10. Skin:  No cyanosis, normal texture and turgor, no rash, lesions or ulcers  11.Musculoskeletal:  Good muscle tone,  joints appear normal , no effusions,  normal range of motion    Data Review:    CBC  Last Labs    Recent Labs Lab 05/04/17 1805 05/04/17 1844  WBC 14.4*  --   HGB 12.1 11.9*  HCT 35.7* 35.0*  PLT 187  --   MCV 93.0  --   MCH 31.5  --   MCHC 33.9  --   RDW 12.8  --   LYMPHSABS 1.3  --   MONOABS 1.3*  --   EOSABS 0.0  --   BASOSABS 0.0  --      ------------------------------------------------------------------------------------------------------------------  Chemistries   Last Labs    Recent Labs Lab 05/04/17 1805 05/04/17 1844  NA 136 136  K 4.5 4.8  CL 102 102  CO2 24  --   GLUCOSE 115* 116*  BUN 18 21*  CREATININE 0.95 0.90  CALCIUM 8.9  --   AST 21  --   ALT 12*  --   ALKPHOS 78  --   BILITOT 0.6  --       ------------------------------------------------------------------------------------------------------------------  ------------------------------------------------------------------------------------------------------------------ GFR: Estimated Creatinine Clearance: 48.8 mL/min (by C-G formula based on SCr of 0.9 mg/dL). Liver Function Tests:  Last Labs    Recent Labs Lab 05/04/17 1805  AST 21  ALT 12*  ALKPHOS 78  BILITOT 0.6  PROT 7.3  ALBUMIN 3.4*     Last Labs   No results for input(s): LIPASE, AMYLASE in the last 168 hours.   Last Labs   No results for input(s): AMMONIA in the last 168 hours.   Coagulation Profile:  Last Labs    Recent Labs Lab 05/04/17 1805  INR 1.07     Cardiac Enzymes: Last Labs   No results for input(s): CKTOTAL, CKMB, CKMBINDEX, TROPONINI in the  last 168 hours.   BNP (last 3 results) Recent Labs (within last 365 days)  No results for input(s): PROBNP in the last 8760 hours.   HbA1C: Recent Labs (last 2 labs)   No results for input(s): HGBA1C in the last 72 hours.   CBG: Last Labs   No results for input(s): GLUCAP in the last 168 hours.   Lipid Profile: Recent Labs (last 2 labs)   No results for input(s): CHOL, HDL, LDLCALC, TRIG, CHOLHDL, LDLDIRECT in the last 72 hours.   Thyroid Function Tests: Recent Labs (last 2 labs)   No results for input(s): TSH, T4TOTAL, FREET4, T3FREE, THYROIDAB in the last 72 hours.   Anemia Panel: Recent Labs (last 2 labs)   No results for input(s): VITAMINB12, FOLATE, FERRITIN, TIBC, IRON, RETICCTPCT in the last 72 hours.    --------------------------------------------------------------------------------------------------------------- Urine analysis: Labs (Brief)          Component Value Date/Time   COLORURINE YELLOW 05/04/2017 1805   APPEARANCEUR HAZY (A) 05/04/2017 1805   LABSPEC 1.019 05/04/2017 1805   PHURINE 6.0 05/04/2017 1805   GLUCOSEU NEGATIVE 05/04/2017 1805    HGBUR SMALL (A) 05/04/2017 1805   BILIRUBINUR NEGATIVE 05/04/2017 1805   KETONESUR NEGATIVE 05/04/2017 1805   PROTEINUR NEGATIVE 05/04/2017 1805   UROBILINOGEN 0.2 10/07/2013 0406   NITRITE NEGATIVE 05/04/2017 1805   LEUKOCYTESUR NEGATIVE 05/04/2017 1805        Imaging Results:     Imaging Results (Last 48 hours)  Dg Chest 2 View  Result Date: 05/04/2017 CLINICAL DATA:  Facial drooping hypertension EXAM: CHEST  2 VIEW COMPARISON:  06/19/2016, 04/25/2016 FINDINGS: No large pleural effusion. Mild kyphosis of the spine with osteopenia and degenerative changes. Increased left greater than right bibasilar opacity. Stable enlarged cardiomediastinal silhouette with atherosclerosis. No pneumothorax. IMPRESSION: 1. Slight increased left greater than right bibasilar opacity, cannot exclude acute infiltrate superimposed on chronic changes. 2. Stable cardiomegaly. Electronically Signed   By: Donavan Foil M.D.   On: 05/04/2017 20:06   Ct Head Wo Contrast  Result Date: 05/04/2017 CLINICAL DATA:  Facial droop and altered LOC EXAM: CT HEAD WITHOUT CONTRAST CT MAXILLOFACIAL WITHOUT CONTRAST TECHNIQUE: Multidetector CT imaging of the head and maxillofacial structures were performed using the standard protocol without intravenous contrast. Multiplanar CT image reconstructions of the maxillofacial structures were also generated. COMPARISON:  04/11/2017, MRI 12/06/2010 FINDINGS: CT HEAD FINDINGS Brain: No acute territorial infarction, hemorrhage or intracranial mass is visualized. Moderate atrophy. Mild small vessel ischemic changes of the white matter. Stable ventricle size. Vascular: No hyperdense vessels.  Carotid artery calcification. Skull: No fracture or suspicious lesion Other: None CT MAXILLOFACIAL FINDINGS Osseous: Minimal anterior positioning of the mandibular heads with respect to the mandibular fossa. No mandibular fracture is seen. No nasal bone fracture. Pterygoid plates and zygomatic  arches are intact. Small fluid in the left mastoid air cells. Orbits: Negative. No traumatic or inflammatory finding. Sinuses: Clear. Soft tissues: Mild to moderate right lower facial soft tissue swelling. No focal fluid collections. IMPRESSION: 1. No CT evidence for acute intracranial abnormality. Atrophy and small vessel ischemic changes of the white matter 2. No acute facial bone fracture. 3. Moderate right lower facial soft tissue swelling, could relate to trauma or cellulitis. No focal fluid collections are seen in this region. Electronically Signed   By: Donavan Foil M.D.   On: 05/04/2017 20:46   Ct Maxillofacial Wo Contrast  Result Date: 05/04/2017 CLINICAL DATA:  Facial droop and altered LOC EXAM: CT HEAD WITHOUT  CONTRAST CT MAXILLOFACIAL WITHOUT CONTRAST TECHNIQUE: Multidetector CT imaging of the head and maxillofacial structures were performed using the standard protocol without intravenous contrast. Multiplanar CT image reconstructions of the maxillofacial structures were also generated. COMPARISON:  04/11/2017, MRI 12/06/2010 FINDINGS: CT HEAD FINDINGS Brain: No acute territorial infarction, hemorrhage or intracranial mass is visualized. Moderate atrophy. Mild small vessel ischemic changes of the white matter. Stable ventricle size. Vascular: No hyperdense vessels.  Carotid artery calcification. Skull: No fracture or suspicious lesion Other: None CT MAXILLOFACIAL FINDINGS Osseous: Minimal anterior positioning of the mandibular heads with respect to the mandibular fossa. No mandibular fracture is seen. No nasal bone fracture. Pterygoid plates and zygomatic arches are intact. Small fluid in the left mastoid air cells. Orbits: Negative. No traumatic or inflammatory finding. Sinuses: Clear. Soft tissues: Mild to moderate right lower facial soft tissue swelling. No focal fluid collections. IMPRESSION: 1. No CT evidence for acute intracranial abnormality. Atrophy and small vessel ischemic changes of  the white matter 2. No acute facial bone fracture. 3. Moderate right lower facial soft tissue swelling, could relate to trauma or cellulitis. No focal fluid collections are seen in this region. Electronically Signed   By: Donavan Foil M.D.   On: 05/04/2017 20:46        Assessment & Plan:   Right facial swelling, recurrent CT maxillofacial showed moderate right lower facial soft tissue swelling due to trauma or cellulitis Her  right sided facial swelling likely due to dental infection versus cellulitis. We'll start clindamycin 600 mg IV every 8 hours  Generalized weakness Likely secondary to  infectious process as above,there is no focal neurological deficit. Patient was unable to walk at the facility since she developed facial swelling. CT head is negative for stroke Will obtain MRI in am. Will not pursue stroke workup at this time.  ? Pneumonia Chest x-ray showed slight increased left greater than right bibasilar opacity, cannot exclude acute infiltrate superimposed on chronic changes. Patient empirically started on ceftriaxone and Zithromax in the ED. Antibiotics have been changed to clindamycin as above for dental infection/cellulitis, which ould be adequate for pneumonia.  Dementia Stable, at baseline No behavioral disturbance Continue Aricept, Trilby Drummer S M.D on 05/05/2017 at 1:21 AM  Between 7am to 7pm - Pager - [phone]. After 7pm go to [email or web address] - password [facility]  [facility] - Office  [phone]        Routing History

## 2017-05-05 NOTE — Consult Note (Signed)
TR[facility] H&P    Patient Demographics:    Chani Ghanem, is a 83 y.o. female  MRN: [id number]  DOB - 23-Aug-1929  Admit Date - 05/04/2017  Referring MD/NP/PA: Dr Dolly Rias  Outpatient Primary MD for the patient is Celene Squibb, MD  Patient coming from: [place]  Chief Complaint  Patient presents with  . Weakness      HPI:    Ewa Hipp  is a 83 y.o. female, with history of dementia, exam is disease, CAD, hyperlipidemia, hypertension was brought to the emergency room. Chief complaint of right-sided facial swelling, and difficulty walking. Patient was recently seen by dentist, and was told that she might need extraction on left side. She has only 2 , right and left upper molar teeth left as per caregiver. There has been no symptoms of chest pain, no shortness of breath. No nausea vomiting or diarrhea. No dysuria urgency or frequency of urination.  Patient had similar facial swelling on 04/11/2017 when she was seen in the ED.     Review of systems:     All other systems reviewed and are negative.   With Past History of the following :    Past Medical History:  Diagnosis Date  . Alzheimer's disease 09/11/2014  . Coronary artery disease   . Dementia   . High cholesterol   . Hypertension       Past Surgical History:  Procedure Laterality Date  . CATARACT EXTRACTION W/PHACO  12/13/2011   Procedure: CATARACT EXTRACTION PHACO AND INTRAOCULAR LENS PLACEMENT (IOC);  Surgeon: [name], MD;  Location: [facility];  Service: Ophthalmology;  Laterality: Right;  CDE 12.67  . CATARACT EXTRACTION W/PHACO  03/06/2012   Procedure: CATARACT EXTRACTION PHACO AND INTRAOCULAR LENS PLACEMENT (IOC);  Surgeon: [name], MD;  Location: [facility];  Service: Ophthalmology;  Laterality: Left;  CDE:13.12  . CHOLECYSTECTOMY    . COLONOSCOPY  09/28/2011   Procedure: COLONOSCOPY;  Surgeon: [name],  MD;  Location: [facility];  Service: Endoscopy;  Laterality: N/A;  1200      Social History:      Social History  Substance Use Topics  . Smoking status: Former Smoker    Packs/day: 0.25    Years: 10.00    Quit date: 07/18/2007  . Smokeless tobacco: Never Used  . Alcohol use No       Family History :     Family History  Problem Relation Age of Onset  . Dementia Sister   . Dementia Brother       Home Medications:   Prior to Admission medications   Medication Sig Start Date End Date Taking? Authorizing Provider  acetaminophen (TYLENOL) 325 MG tablet Take 325-650 mg by mouth every 6 (six) hours as needed.    Yes [provider]  ALPRAZolam [name]) 0.5 MG tablet Take 0.5 mg by mouth 3 (three) times daily as needed for anxiety. For anxiety   Yes [provider]  donepezil (ARICEPT) 10 MG tablet Take 1 tablet (10 mg total) by mouth  at bedtime. 10/12/15  Yes [name], NP  losartan (COZAAR) 100 MG tablet Take 100 mg by mouth daily.   Yes [provider]  memantine (NAMENDA) 10 MG tablet Take 1 tablet (10 mg total) by mouth 2 (two) times daily. 10/12/15  Yes [name], NP  Multiple Vitamin (MULTIVITAMIN WITH MINERALS) TABS tablet Take 1 tablet by mouth daily.   Yes [provider]  potassium chloride SA (K-DUR,KLOR-CON) 20 MEQ tablet Take 20 mEq by mouth daily. Reported on 10/12/2015   Yes [provider]  risperiDONE (RISPERDAL) 1 MG tablet Take 1 mg by mouth at bedtime.   Yes [provider]  simvastatin (ZOCOR) 20 MG tablet Take 20 mg by mouth daily.   Yes [provider]     Allergies:    No Known Allergies   Physical Exam:   Vitals  Blood pressure (!) 111/54, pulse 72, resp. rate 18, height 5\' 6"  (1.676 m), weight 77.1 kg (170 lb), SpO2 98 %.  1.  General: Appears in no acute distress  2. Psychiatric:  Intact judgement and  insight, awake alert, oriented x 3.  3. Neurologic: No focal  neurological deficits, all cranial nerves intact.Strength 5/5 all 4 extremities, sensation intact all 4 extremities, plantars down going.  4. Eyes :  anicteric sclerae, moist conjunctivae with no lid lag. PERRLA.  5. ENMT:  Oropharynx clear, patient has significant right cheek swelling, tenderness noted on palpation of right upper molar,surrounding gum tissue.  6. Neck:  supple, no cervical lymphadenopathy appriciated, No thyromegaly  7. Respiratory : Normal respiratory effort, good air movement bilaterally,clear to  auscultation bilaterally  8. Cardiovascular : RRR, no gallops, rubs or murmurs, no leg edema  9. Gastrointestinal:  Positive bowel sounds, abdomen soft, non-tender to palpation,no hepatosplenomegaly, no rigidity or guarding       10. Skin:  No cyanosis, normal texture and turgor, no rash, lesions or ulcers  11.Musculoskeletal:  Good muscle tone,  joints appear normal , no effusions,  normal range of motion    Data Review:    CBC  Recent Labs Lab 05/04/17 1805 05/04/17 1844  WBC 14.4*  --   HGB 12.1 11.9*  HCT 35.7* 35.0*  PLT 187  --   MCV 93.0  --   MCH 31.5  --   MCHC 33.9  --   RDW 12.8  --   LYMPHSABS 1.3  --   MONOABS 1.3*  --   EOSABS 0.0  --   BASOSABS 0.0  --    ------------------------------------------------------------------------------------------------------------------  Chemistries   Recent Labs Lab 05/04/17 1805 05/04/17 1844  NA 136 136  K 4.5 4.8  CL 102 102  CO2 24  --   GLUCOSE 115* 116*  BUN 18 21*  CREATININE 0.95 0.90  CALCIUM 8.9  --   AST 21  --   ALT 12*  --   ALKPHOS 78  --   BILITOT 0.6  --    ------------------------------------------------------------------------------------------------------------------  ------------------------------------------------------------------------------------------------------------------ GFR: Estimated Creatinine Clearance: 48.8 mL/min (by C-G formula based on SCr of 0.9  mg/dL). Liver Function Tests:  Recent Labs Lab 05/04/17 1805  AST 21  ALT 12*  ALKPHOS 78  BILITOT 0.6  PROT 7.3  ALBUMIN 3.4*   No results for input(s): LIPASE, AMYLASE in the last 168 hours. No results for input(s): AMMONIA in the last 168 hours. Coagulation Profile:  Recent Labs Lab 05/04/17 1805  INR 1.07   Cardiac Enzymes: No results for input(s): CKTOTAL, CKMB, CKMBINDEX,  TROPONINI in the last 168 hours. BNP (last 3 results) No results for input(s): PROBNP in the last 8760 hours. HbA1C: No results for input(s): HGBA1C in the last 72 hours. CBG: No results for input(s): GLUCAP in the last 168 hours. Lipid Profile: No results for input(s): CHOL, HDL, LDLCALC, TRIG, CHOLHDL, LDLDIRECT in the last 72 hours. Thyroid Function Tests: No results for input(s): TSH, T4TOTAL, FREET4, T3FREE, THYROIDAB in the last 72 hours. Anemia Panel: No results for input(s): VITAMINB12, FOLATE, FERRITIN, TIBC, IRON, RETICCTPCT in the last 72 hours.  --------------------------------------------------------------------------------------------------------------- Urine analysis:    Component Value Date/Time   COLORURINE YELLOW 05/04/2017 1805   APPEARANCEUR HAZY (A) 05/04/2017 1805   LABSPEC 1.019 05/04/2017 1805   PHURINE 6.0 05/04/2017 1805   GLUCOSEU NEGATIVE 05/04/2017 1805   HGBUR SMALL (A) 05/04/2017 1805   BILIRUBINUR NEGATIVE 05/04/2017 1805   KETONESUR NEGATIVE 05/04/2017 1805   PROTEINUR NEGATIVE 05/04/2017 1805   UROBILINOGEN 0.2 10/07/2013 0406   NITRITE NEGATIVE 05/04/2017 1805   LEUKOCYTESUR NEGATIVE 05/04/2017 1805      Imaging Results:    Dg Chest 2 View  Result Date: 05/04/2017 CLINICAL DATA:  Facial drooping hypertension EXAM: CHEST  2 VIEW COMPARISON:  06/19/2016, 04/25/2016 FINDINGS: No large pleural effusion. Mild kyphosis of the spine with osteopenia and degenerative changes. Increased left greater than right bibasilar opacity. Stable enlarged  cardiomediastinal silhouette with atherosclerosis. No pneumothorax. IMPRESSION: 1. Slight increased left greater than right bibasilar opacity, cannot exclude acute infiltrate superimposed on chronic changes. 2. Stable cardiomegaly. Electronically Signed   By: Donavan Foil M.D.   On: 05/04/2017 20:06   Ct Head Wo Contrast  Result Date: 05/04/2017 CLINICAL DATA:  Facial droop and altered LOC EXAM: CT HEAD WITHOUT CONTRAST CT MAXILLOFACIAL WITHOUT CONTRAST TECHNIQUE: Multidetector CT imaging of the head and maxillofacial structures were performed using the standard protocol without intravenous contrast. Multiplanar CT image reconstructions of the maxillofacial structures were also generated. COMPARISON:  04/11/2017, MRI 12/06/2010 FINDINGS: CT HEAD FINDINGS Brain: No acute territorial infarction, hemorrhage or intracranial mass is visualized. Moderate atrophy. Mild small vessel ischemic changes of the white matter. Stable ventricle size. Vascular: No hyperdense vessels.  Carotid artery calcification. Skull: No fracture or suspicious lesion Other: None CT MAXILLOFACIAL FINDINGS Osseous: Minimal anterior positioning of the mandibular heads with respect to the mandibular fossa. No mandibular fracture is seen. No nasal bone fracture. Pterygoid plates and zygomatic arches are intact. Small fluid in the left mastoid air cells. Orbits: Negative. No traumatic or inflammatory finding. Sinuses: Clear. Soft tissues: Mild to moderate right lower facial soft tissue swelling. No focal fluid collections. IMPRESSION: 1. No CT evidence for acute intracranial abnormality. Atrophy and small vessel ischemic changes of the white matter 2. No acute facial bone fracture. 3. Moderate right lower facial soft tissue swelling, could relate to trauma or cellulitis. No focal fluid collections are seen in this region. Electronically Signed   By: Donavan Foil M.D.   On: 05/04/2017 20:46   Ct Maxillofacial Wo Contrast  Result Date:  05/04/2017 CLINICAL DATA:  Facial droop and altered LOC EXAM: CT HEAD WITHOUT CONTRAST CT MAXILLOFACIAL WITHOUT CONTRAST TECHNIQUE: Multidetector CT imaging of the head and maxillofacial structures were performed using the standard protocol without intravenous contrast. Multiplanar CT image reconstructions of the maxillofacial structures were also generated. COMPARISON:  04/11/2017, MRI 12/06/2010 FINDINGS: CT HEAD FINDINGS Brain: No acute territorial infarction, hemorrhage or intracranial mass is visualized. Moderate atrophy. Mild small vessel ischemic changes of the white matter. Stable  ventricle size. Vascular: No hyperdense vessels.  Carotid artery calcification. Skull: No fracture or suspicious lesion Other: None CT MAXILLOFACIAL FINDINGS Osseous: Minimal anterior positioning of the mandibular heads with respect to the mandibular fossa. No mandibular fracture is seen. No nasal bone fracture. Pterygoid plates and zygomatic arches are intact. Small fluid in the left mastoid air cells. Orbits: Negative. No traumatic or inflammatory finding. Sinuses: Clear. Soft tissues: Mild to moderate right lower facial soft tissue swelling. No focal fluid collections. IMPRESSION: 1. No CT evidence for acute intracranial abnormality. Atrophy and small vessel ischemic changes of the white matter 2. No acute facial bone fracture. 3. Moderate right lower facial soft tissue swelling, could relate to trauma or cellulitis. No focal fluid collections are seen in this region. Electronically Signed   By: Donavan Foil M.D.   On: 05/04/2017 20:46       Assessment & Plan:   Right facial swelling, recurrent Patient likely has infected right upper molar tooth, which is causing recurrent swelling and pain on right side. Would recommend discharging patient on Augmentin, with follow-up with dentist for possible extraction of right upper molar tooth.    Eleonore Chiquito S M.D on 05/05/2017 at 1:21 AM  Between 7am to 7pm - Pager -  [phone]. After 7pm go to [email or web address] - password [facility]  [facility] - Office  [phone]

## 2017-05-05 NOTE — Evaluation (Signed)
Clinical/Bedside Swallow Evaluation Patient Details  Name: [name] MRN: [id number] Date of Birth: April 23, 1930  Today's Date: 05/05/2017 Time: SLP Start Time (ACUTE ONLY): 1803 SLP Stop Time (ACUTE ONLY): 1821 SLP Time Calculation (min) (ACUTE ONLY): 18 min  Past Medical History:  Past Medical History:  Diagnosis Date  . Alzheimer's disease 09/11/2014  . Coronary artery disease   . Dementia   . High cholesterol   . Hypertension    Past Surgical History:  Past Surgical History:  Procedure Laterality Date  . CATARACT EXTRACTION W/PHACO  12/13/2011   Procedure: CATARACT EXTRACTION PHACO AND INTRAOCULAR LENS PLACEMENT (IOC);  Surgeon: [name], MD;  Location: [facility];  Service: Ophthalmology;  Laterality: Right;  CDE 12.67  . CATARACT EXTRACTION W/PHACO  03/06/2012   Procedure: CATARACT EXTRACTION PHACO AND INTRAOCULAR LENS PLACEMENT (IOC);  Surgeon: [name], MD;  Location: [facility];  Service: Ophthalmology;  Laterality: Left;  CDE:13.12  . CHOLECYSTECTOMY    . COLONOSCOPY  09/28/2011   Procedure: COLONOSCOPY;  Surgeon: [name], MD;  Location: [facility];  Service: Endoscopy;  Laterality: N/A;  1200   HPI:  GladysBlackwellis a 83 y.o.female,with history of dementia, exam is disease, CAD, hyperlipidemia, hypertension was brought to the emergency room. Chief complaint of right-sided facial swelling, and difficulty walking. Brain MRI reveals no evidence of infarct or other   Assessment / Plan / Recommendation Clinical Impression  Pt was evaluated with family at bedside for clinical swallowing evaluation. SLP did note R facial swelling however it did not negatively impact lingual and labial ROM or strength. Pt consumed regular textures, jell-o, ice chips and thin liquids and demonstrated no overt s/sx of aspiration with any textures or consistencies presented. Recommend initiate Regular diet and thin liquids. Follow universal aspiration precautions and  recommend 1:1 support for all meals d/t impaired cognition. There are no further ST needs at this time, ST to sign off. SLP Visit Diagnosis: Dysphagia, oral phase (R13.11)    Aspiration Risk  Mild aspiration risk    Diet Recommendation Regular;Thin liquid   Liquid Administration via: Cup;Straw Medication Administration: Whole meds with puree Supervision: Patient able to self feed Compensations: Minimize environmental distractions;Slow rate Postural Changes: Seated upright at 90 degrees;Remain upright for at least 30 minutes after po intake    Other  Recommendations Oral Care Recommendations: Oral care BID   Follow up Recommendations None        Swallow Study   General Date of Onset: 05/04/17 HPI: GladysBlackwellis a 83 y.o.female,with history of dementia, exam is disease, CAD, hyperlipidemia, hypertension was brought to the emergency room. Chief complaint of right-sided facial swelling, and difficulty walking. Brain MRI reveals no evidence of infarct or other Type of Study: Bedside Swallow Evaluation Previous Swallow Assessment: none Diet Prior to this Study: NPO Temperature Spikes Noted: No Respiratory Status: Room air History of Recent Intubation: No Behavior/Cognition: Alert;Cooperative;Pleasant mood Oral Cavity Assessment: Within Functional Limits Oral Care Completed by SLP: Recent completion by staff Oral Cavity - Dentition: Dentures, top;Dentures, bottom Vision: Functional for self-feeding Self-Feeding Abilities: Able to feed self;Able to feed self with adaptive devices;Needs assist Patient Positioning: Upright in bed Baseline Vocal Quality: Normal Volitional Cough: Cognitively unable to elicit    Oral/Motor/Sensory Function Overall Oral Motor/Sensory Function: Within functional limits   Ice Chips Ice chips: Within functional limits   Thin Liquid Thin Liquid: Within functional limits    Nectar Thick Nectar Thick Liquid: Not tested   Honey Thick Honey Thick  Liquid: Not tested   Puree Puree: Within functional limits   Solid   GO          Fara Worthy H. Roddie Mc, CCC-SLP Speech Language Pathologist  Solid: Within functional limits    Functional Assessment Tool Used: clinical judgement Functional Limitations: Swallowing Swallow Current Status [phone]): At least 1 percent but less than 20 percent impaired, limited or restricted Swallow Goal Status [phone]): At least 1 percent but less than 20 percent impaired, limited or restricted Swallow Discharge Status [phone]): At least 1 percent but less than 20 percent impaired, limited or restricted   Wende Bushy 05/05/2017,6:22 PM

## 2017-05-05 NOTE — Progress Notes (Signed)
[facility] PROGRESS NOTE  [name] OEV:[id number] DOB: 1929-09-13 DOA: 05/04/2017  PCP: Celene Squibb, MD  Brief History/Interval Summary: 83 year old African-American female with a past medical history of dementia, coronary artery disease, hyperlipidemia, hypertension, who apparently lives in a nursing facility, was brought in due to right-sided facial swelling and difficulty walking. Apparently seen recently by her dentist and was told that she might need tooth extraction on the left side. Initially plan was for her to be discharged back on oral antibiotics. However, there was concern about patient's inability to ambulate. So she was hospitalized for further management.  Reason for Visit: right-sided facial swelling/facial cellulitis/dental infection  Consultants: none  Procedures: none  Antibiotics: clindamycin  Subjective/Interval History: Patient disoriented and distracted due to dementia. Unable to provide any information.  ROS: Unable to do in this patient with dementia  Objective:  Vital Signs  Vitals:   05/05/17 0800 05/05/17 0830 05/05/17 1000 05/05/17 1207  BP: (!) 132/55 (!) 149/63 (!) 116/97 (!) 148/58  Pulse: 61 72 (!) 57 67  Resp: 18 16  17   Temp:      TempSrc:      SpO2: 100% 97% 98% 98%  Weight:      Height:        Intake/Output Summary (Last 24 hours) at 05/05/17 1314 Last data filed at 05/05/17 0043  Gross per 24 hour  Intake             1050 ml  Output                0 ml  Net             1050 ml   Filed Weights   05/04/17 1755  Weight: 77.1 kg (170 lb)    General appearance: alert, cooperative, appears stated age, distracted and no distress Right sided jaw swelling is noted with mild erythema. Warm to touch. No definite fluctuation. Resp: clear to auscultation bilaterally Cardio: regular rate and rhythm, S1, S2 normal, no murmur, click, rub or gallop GI: soft, non-tender; bowel sounds normal; no masses,  no  organomegaly Extremities: extremities normal, atraumatic, no cyanosis or edema Neurologic: disoriented. No focal neurological deficits noted.  Lab Results:  Data Reviewed: I have personally reviewed following labs and imaging studies  CBC:  Recent Labs Lab 05/04/17 1805 05/04/17 1844 05/05/17 0457  WBC 14.4*  --  11.1*  NEUTROABS 11.8*  --   --   HGB 12.1 11.9* 10.6*  HCT 35.7* 35.0* 32.0*  MCV 93.0  --  93.8  PLT 187  --  829    Basic Metabolic Panel:  Recent Labs Lab 05/04/17 1805 05/04/17 1844 05/05/17 0457  NA 136 136 138  K 4.5 4.8 3.6  CL 102 102 105  CO2 24  --  25  GLUCOSE 115* 116* 96  BUN 18 21* 13  CREATININE 0.95 0.90 0.79  CALCIUM 8.9  --  8.7*    GFR: Estimated Creatinine Clearance: 54.9 mL/min (by C-G formula based on SCr of 0.79 mg/dL).  Liver Function Tests:  Recent Labs Lab 05/04/17 1805 05/05/17 0457  AST 21 14*  ALT 12* 11*  ALKPHOS 78 62  BILITOT 0.6 0.6  PROT 7.3 6.2*  ALBUMIN 3.4* 2.8*    Coagulation Profile:  Recent Labs Lab 05/04/17 1805  INR 1.07     Radiology Studies: Dg Chest 2 View  Result Date: 05/04/2017 CLINICAL DATA:  Facial drooping hypertension EXAM: CHEST  2 VIEW COMPARISON:  06/19/2016, 04/25/2016 FINDINGS: No large pleural effusion. Mild kyphosis of the spine with osteopenia and degenerative changes. Increased left greater than right bibasilar opacity. Stable enlarged cardiomediastinal silhouette with atherosclerosis. No pneumothorax. IMPRESSION: 1. Slight increased left greater than right bibasilar opacity, cannot exclude acute infiltrate superimposed on chronic changes. 2. Stable cardiomegaly. Electronically Signed   By: Donavan Foil M.D.   On: 05/04/2017 20:06   Ct Head Wo Contrast  Result Date: 05/04/2017 CLINICAL DATA:  Facial droop and altered LOC EXAM: CT HEAD WITHOUT CONTRAST CT MAXILLOFACIAL WITHOUT CONTRAST TECHNIQUE: Multidetector CT imaging of the head and maxillofacial structures were  performed using the standard protocol without intravenous contrast. Multiplanar CT image reconstructions of the maxillofacial structures were also generated. COMPARISON:  04/11/2017, MRI 12/06/2010 FINDINGS: CT HEAD FINDINGS Brain: No acute territorial infarction, hemorrhage or intracranial mass is visualized. Moderate atrophy. Mild small vessel ischemic changes of the white matter. Stable ventricle size. Vascular: No hyperdense vessels.  Carotid artery calcification. Skull: No fracture or suspicious lesion Other: None CT MAXILLOFACIAL FINDINGS Osseous: Minimal anterior positioning of the mandibular heads with respect to the mandibular fossa. No mandibular fracture is seen. No nasal bone fracture. Pterygoid plates and zygomatic arches are intact. Small fluid in the left mastoid air cells. Orbits: Negative. No traumatic or inflammatory finding. Sinuses: Clear. Soft tissues: Mild to moderate right lower facial soft tissue swelling. No focal fluid collections. IMPRESSION: 1. No CT evidence for acute intracranial abnormality. Atrophy and small vessel ischemic changes of the white matter 2. No acute facial bone fracture. 3. Moderate right lower facial soft tissue swelling, could relate to trauma or cellulitis. No focal fluid collections are seen in this region. Electronically Signed   By: Donavan Foil M.D.   On: 05/04/2017 20:46   Mr Brain Wo Contrast  Result Date: 05/05/2017 CLINICAL DATA:  Right facial swelling.  Generalized weakness. EXAM: MRI HEAD WITHOUT CONTRAST TECHNIQUE: Multiplanar, multiecho pulse sequences of the brain and surrounding structures were obtained without intravenous contrast. COMPARISON:  Head CT 05/04/2017 and MRI 12/06/2010 FINDINGS: Some sequences are moderately motion degraded. Brain: There is no evidence of acute infarct, mass, midline shift, or extra-axial fluid collection. A small focus of susceptibility artifact in the left frontal lobe cortex suggests a chronic microhemorrhage.  There is moderate cerebral atrophy, with prominent mesial temporal lobe atrophy noted. T2 hyperintensities in the periventricular white matter and pons are nonspecific but compatible with chronic small vessel ischemic disease, mild for age. Vascular: Major intracranial vascular flow voids are preserved. Skull and upper cervical spine: Unremarkable bone marrow signal. Sinuses/Orbits: Bilateral cataract extraction. Paranasal sinuses and mastoid air cells are clear. Other: None. IMPRESSION: 1. Motion degraded examination without evidence of infarct or other acute abnormality. 2. Mild chronic small vessel ischemic disease and moderate cerebral atrophy. Electronically Signed   By: Logan Bores M.D.   On: 05/05/2017 09:34   Ct Maxillofacial Wo Contrast  Result Date: 05/04/2017 CLINICAL DATA:  Facial droop and altered LOC EXAM: CT HEAD WITHOUT CONTRAST CT MAXILLOFACIAL WITHOUT CONTRAST TECHNIQUE: Multidetector CT imaging of the head and maxillofacial structures were performed using the standard protocol without intravenous contrast. Multiplanar CT image reconstructions of the maxillofacial structures were also generated. COMPARISON:  04/11/2017, MRI 12/06/2010 FINDINGS: CT HEAD FINDINGS Brain: No acute territorial infarction, hemorrhage or intracranial mass is visualized. Moderate atrophy. Mild small vessel ischemic changes of the white matter. Stable ventricle size. Vascular: No hyperdense vessels.  Carotid artery calcification. Skull: No fracture or suspicious lesion Other: None CT  MAXILLOFACIAL FINDINGS Osseous: Minimal anterior positioning of the mandibular heads with respect to the mandibular fossa. No mandibular fracture is seen. No nasal bone fracture. Pterygoid plates and zygomatic arches are intact. Small fluid in the left mastoid air cells. Orbits: Negative. No traumatic or inflammatory finding. Sinuses: Clear. Soft tissues: Mild to moderate right lower facial soft tissue swelling. No focal fluid  collections. IMPRESSION: 1. No CT evidence for acute intracranial abnormality. Atrophy and small vessel ischemic changes of the white matter 2. No acute facial bone fracture. 3. Moderate right lower facial soft tissue swelling, could relate to trauma or cellulitis. No focal fluid collections are seen in this region. Electronically Signed   By: Donavan Foil M.D.   On: 05/04/2017 20:46     Medications:  Scheduled: . donepezil  10 mg Oral QHS  . enoxaparin (LOVENOX) injection  30 mg Subcutaneous Q24H  . losartan  100 mg Oral Daily  . memantine  10 mg Oral BID  . potassium chloride SA  20 mEq Oral Daily  . risperiDONE  1 mg Oral QHS  . saccharomyces boulardii  250 mg Oral BID  . simvastatin  20 mg Oral Daily   Continuous: . sodium chloride    . clindamycin (CLEOCIN) IV Stopped (05/05/17 3734)   KAJ:[id number], ALPRAZolam, ondansetron **OR** ondansetron (ZOFRAN) IV  Assessment/Plan:  Active Problems:   Right facial swelling    Right-sided facial swelling CT scan did not show any abscess. This is most likely due to a dental infection. Patient started on clindamycin which will be continued for now. Monitor WBC. Will need to be seen by dentist as an outpatient.  Generalized weakness Most likely due to the above infection. Patient does not have any focal neurological deficits. Concern was for stroke. MRI was done and was a limited study, however did not show any acute changes. She is nonfocal on examination except for her dementia.  Questionable pneumonia. Does not appear to be symptomatic. Continue antibiotics as mentioned above.  History of dementia Appears to be stable. No behavioral disturbances. Continue home medications.  Essential hypertension. Continue home medications.  Normocytic anemia. No evidence for overt bleeding. Continue to monitor  DVT Prophylaxis: Lovenox    Code Status: DNR  Family Communication: no family at bedside  Disposition Plan: PT and OT  evaluation.    LOS: 0 days   [facility] Pager [phone] 05/05/2017, 1:14 PM  If 7PM-7AM, please contact night-coverage at [email or web address], password [facility]

## 2017-05-05 NOTE — ED Notes (Signed)
Pure wick in place 

## 2017-05-05 NOTE — ED Notes (Signed)
Pt transported to MRI 

## 2017-05-06 DIAGNOSIS — R220 Localized swelling, mass and lump, head: Secondary | ICD-10-CM | POA: Diagnosis not present

## 2017-05-06 LAB — CBC
HEMATOCRIT: 33.5 % — AB (ref 36.0–46.0)
HEMOGLOBIN: 11.2 g/dL — AB (ref 12.0–15.0)
MCH: 30.9 pg (ref 26.0–34.0)
MCHC: 33.4 g/dL (ref 30.0–36.0)
MCV: 92.5 fL (ref 78.0–100.0)
Platelets: 190 10*3/uL (ref 150–400)
RBC: 3.62 MIL/uL — ABNORMAL LOW (ref 3.87–5.11)
RDW: 12.6 % (ref 11.5–15.5)
WBC: 7.1 10*3/uL (ref 4.0–10.5)

## 2017-05-06 LAB — BASIC METABOLIC PANEL
Anion gap: 9 (ref 5–15)
BUN: 13 mg/dL (ref 6–20)
CHLORIDE: 104 mmol/L (ref 101–111)
CO2: 27 mmol/L (ref 22–32)
CREATININE: 0.77 mg/dL (ref 0.44–1.00)
Calcium: 8.8 mg/dL — ABNORMAL LOW (ref 8.9–10.3)
GFR calc Af Amer: >60 mL/min (ref >60)
GFR calc non Af Amer: >60 mL/min (ref >60)
GLUCOSE: 103 mg/dL — AB (ref 65–99)
Potassium: 3.4 mmol/L — ABNORMAL LOW (ref 3.5–5.1)
Sodium: 140 mmol/L (ref 135–145)

## 2017-05-06 MED ORDER — CLINDAMYCIN HCL 300 MG PO CAPS: 300.0000 mg | ORAL_CAPSULE | Freq: Four times a day (QID) | ORAL | 0 refills | Status: AC

## 2017-05-06 MED ORDER — POTASSIUM CHLORIDE CRYS ER 20 MEQ PO TBCR
20.0000 meq | EXTENDED_RELEASE_TABLET | Freq: Once | ORAL | Status: AC
  Administered 2017-05-06: 20 meq via ORAL
  Filled 2017-05-06: qty 1

## 2017-05-06 MED ORDER — CLINDAMYCIN HCL 150 MG PO CAPS
300.0000 mg | ORAL_CAPSULE | Freq: Four times a day (QID) | ORAL | Status: DC
  Administered 2017-05-06 (×2): 300 mg via ORAL
  Filled 2017-05-06 (×2): qty 2

## 2017-05-06 MED ORDER — SACCHAROMYCES BOULARDII 250 MG PO CAPS: 250.0000 mg | ORAL_CAPSULE | Freq: Two times a day (BID) | ORAL | 0 refills | Status: DC

## 2017-05-06 NOTE — Progress Notes (Signed)
Pt IV removed, WNL. D/C instructions explained to pt and family. Verbalized understanding. Pt family present to transport back to facility.

## 2017-05-06 NOTE — Discharge Summary (Signed)
[facility]  Physician Discharge Summary   Patient ID: [name] MRN: [id number] DOB/AGE: [birth year] 83 y.o.  Admit date: 05/04/2017 Discharge date: 05/06/2017  PCP: Celene Squibb, MD  DISCHARGE DIAGNOSES:  Active Problems:   Right facial swelling   RECOMMENDATIONS FOR OUTPATIENT FOLLOW UP: 1. It has been recommended to the patient's family that patient needs to be seen by a dentist within the next 2-3 days.   DISCHARGE CONDITION: fair  Diet recommendation: As before.   Filed Weights   05/04/17 1755 05/05/17 1446  Weight: 77.1 kg (170 lb) 77.1 kg (170 lb)    INITIAL HISTORY: 83 year old African-American female with a past medical history of dementia, coronary artery disease, hyperlipidemia, hypertension, who apparently lives in a nursing facility, was brought in due to right-sided facial swelling and difficulty walking. Apparently seen recently by her dentist and was told that she might need tooth extraction on the left side. Initially plan was for her to be discharged back on oral antibiotics. However, there was concern about patient's inability to ambulate. So she was hospitalized for further management.   HOSPITAL COURSE:   Right-sided facial swelling CT scan did not show any abscess. This is most likely due to a dental infection. Patient started on clindamycin which will be continued for now.  This will be changed over to oral.  WBC is now normal.  She is afebrile.  Facial swelling has significantly improved.  According to the patient's daughter-in-law she is supposed to see a dentist in [place] area.  Recommend that she be seen as soon as possible.    Generalized weakness Most likely due to the above infection. Patient does not have any focal neurological deficits. Concern was for stroke. MRI was done and was a limited study, however did not show any acute changes. She is nonfocal on examination except for her dementia.  She has been ambulating  without any problems.  No further workup is needed.  Questionable pneumonia. Does not appear to be symptomatic. Continue antibiotics as mentioned above.  History of dementia Appears to be stable. No behavioral disturbances. Continue home medications.  Essential hypertension. Continue home medications.  Normocytic anemia. No evidence for overt bleeding. Continue to monitor  Potassium will be repleted.  Discussed in detail with patient's daughter.  Okay for discharge back to her facility.    PERTINENT LABS:  The results of significant diagnostics from this hospitalization (including imaging, microbiology, ancillary and laboratory) are listed below for reference.     Labs: Basic Metabolic Panel:  Recent Labs Lab 05/04/17 1805 05/04/17 1844 05/05/17 0457 05/06/17 0626  NA 136 136 138 140  K 4.5 4.8 3.6 3.4*  CL 102 102 105 104  CO2 24  --  25 27  GLUCOSE 115* 116* 96 103*  BUN 18 21* 13 13  CREATININE 0.95 0.90 0.79 0.77  CALCIUM 8.9  --  8.7* 8.8*   Liver Function Tests:  Recent Labs Lab 05/04/17 1805 05/05/17 0457  AST 21 14*  ALT 12* 11*  ALKPHOS 78 62  BILITOT 0.6 0.6  PROT 7.3 6.2*  ALBUMIN 3.4* 2.8*   CBC:  Recent Labs Lab 05/04/17 1805 05/04/17 1844 05/05/17 0457 05/06/17 0626  WBC 14.4*  --  11.1* 7.1  NEUTROABS 11.8*  --   --   --   HGB 12.1 11.9* 10.6* 11.2*  HCT 35.7* 35.0* 32.0* 33.5*  MCV 93.0  --  93.8 92.5  PLT 187  --  179 190   BNP:  BNP (last 3 results)  Recent Labs  05/04/17 1805  BNP 43.0     IMAGING STUDIES Dg Chest 2 View  Result Date: 05/04/2017 CLINICAL DATA:  Facial drooping hypertension EXAM: CHEST  2 VIEW COMPARISON:  06/19/2016, 04/25/2016 FINDINGS: No large pleural effusion. Mild kyphosis of the spine with osteopenia and degenerative changes. Increased left greater than right bibasilar opacity. Stable enlarged cardiomediastinal silhouette with atherosclerosis. No pneumothorax. IMPRESSION: 1. Slight  increased left greater than right bibasilar opacity, cannot exclude acute infiltrate superimposed on chronic changes. 2. Stable cardiomegaly. Electronically Signed   By: Donavan Foil M.D.   On: 05/04/2017 20:06   Ct Head Wo Contrast  Result Date: 05/04/2017 CLINICAL DATA:  Facial droop and altered LOC EXAM: CT HEAD WITHOUT CONTRAST CT MAXILLOFACIAL WITHOUT CONTRAST TECHNIQUE: Multidetector CT imaging of the head and maxillofacial structures were performed using the standard protocol without intravenous contrast. Multiplanar CT image reconstructions of the maxillofacial structures were also generated. COMPARISON:  04/11/2017, MRI 12/06/2010 FINDINGS: CT HEAD FINDINGS Brain: No acute territorial infarction, hemorrhage or intracranial mass is visualized. Moderate atrophy. Mild small vessel ischemic changes of the white matter. Stable ventricle size. Vascular: No hyperdense vessels.  Carotid artery calcification. Skull: No fracture or suspicious lesion Other: None CT MAXILLOFACIAL FINDINGS Osseous: Minimal anterior positioning of the mandibular heads with respect to the mandibular fossa. No mandibular fracture is seen. No nasal bone fracture. Pterygoid plates and zygomatic arches are intact. Small fluid in the left mastoid air cells. Orbits: Negative. No traumatic or inflammatory finding. Sinuses: Clear. Soft tissues: Mild to moderate right lower facial soft tissue swelling. No focal fluid collections. IMPRESSION: 1. No CT evidence for acute intracranial abnormality. Atrophy and small vessel ischemic changes of the white matter 2. No acute facial bone fracture. 3. Moderate right lower facial soft tissue swelling, could relate to trauma or cellulitis. No focal fluid collections are seen in this region. Electronically Signed   By: Donavan Foil M.D.   On: 05/04/2017 20:46   Ct Head Wo Contrast  Result Date: 04/11/2017 CLINICAL DATA:  AMS Patient restrained during exam, patient unable to follow directions keeps  rasing up during exam. Slight head drift to the right when EMS got there. [facility] Did an NIH Stroke scale and it was negative. EXAM: CT HEAD WITHOUT CONTRAST TECHNIQUE: Contiguous axial images were obtained from the base of the skull through the vertex without intravenous contrast. COMPARISON:  06/19/2016 FINDINGS: Brain: There is moderate central and cortical atrophy. Periventricular white matter changes are consistent with small vessel disease. There is no intra or extra-axial fluid collection or mass lesion. The basilar cisterns and ventricles have a normal appearance. There is no CT evidence for acute infarction or hemorrhage. Vascular: No hyperdense vessel or unexpected calcification. Skull: Normal. Negative for fracture or focal lesion. Sinuses/Orbits: Paranasal sinuses are normally aerated. A small left mastoid effusion is present. Orbits are intact. Other: None IMPRESSION: 1. Atrophy and small vessel disease. 2.  No evidence for acute intracranial abnormality. 3. Small left mastoid effusion. Electronically Signed   By: Nolon Nations M.D.   On: 04/11/2017 18:52   Mr Brain Wo Contrast  Result Date: 05/05/2017 CLINICAL DATA:  Right facial swelling.  Generalized weakness. EXAM: MRI HEAD WITHOUT CONTRAST TECHNIQUE: Multiplanar, multiecho pulse sequences of the brain and surrounding structures were obtained without intravenous contrast. COMPARISON:  Head CT 05/04/2017 and MRI 12/06/2010 FINDINGS: Some sequences are moderately motion degraded. Brain: There is no evidence of acute infarct, mass, midline shift, or  extra-axial fluid collection. A small focus of susceptibility artifact in the left frontal lobe cortex suggests a chronic microhemorrhage. There is moderate cerebral atrophy, with prominent mesial temporal lobe atrophy noted. T2 hyperintensities in the periventricular white matter and pons are nonspecific but compatible with chronic small vessel ischemic disease, mild for age. Vascular: Major  intracranial vascular flow voids are preserved. Skull and upper cervical spine: Unremarkable bone marrow signal. Sinuses/Orbits: Bilateral cataract extraction. Paranasal sinuses and mastoid air cells are clear. Other: None. IMPRESSION: 1. Motion degraded examination without evidence of infarct or other acute abnormality. 2. Mild chronic small vessel ischemic disease and moderate cerebral atrophy. Electronically Signed   By: Logan Bores M.D.   On: 05/05/2017 09:34   Ct Maxillofacial Wo Contrast  Result Date: 05/04/2017 CLINICAL DATA:  Facial droop and altered LOC EXAM: CT HEAD WITHOUT CONTRAST CT MAXILLOFACIAL WITHOUT CONTRAST TECHNIQUE: Multidetector CT imaging of the head and maxillofacial structures were performed using the standard protocol without intravenous contrast. Multiplanar CT image reconstructions of the maxillofacial structures were also generated. COMPARISON:  04/11/2017, MRI 12/06/2010 FINDINGS: CT HEAD FINDINGS Brain: No acute territorial infarction, hemorrhage or intracranial mass is visualized. Moderate atrophy. Mild small vessel ischemic changes of the white matter. Stable ventricle size. Vascular: No hyperdense vessels.  Carotid artery calcification. Skull: No fracture or suspicious lesion Other: None CT MAXILLOFACIAL FINDINGS Osseous: Minimal anterior positioning of the mandibular heads with respect to the mandibular fossa. No mandibular fracture is seen. No nasal bone fracture. Pterygoid plates and zygomatic arches are intact. Small fluid in the left mastoid air cells. Orbits: Negative. No traumatic or inflammatory finding. Sinuses: Clear. Soft tissues: Mild to moderate right lower facial soft tissue swelling. No focal fluid collections. IMPRESSION: 1. No CT evidence for acute intracranial abnormality. Atrophy and small vessel ischemic changes of the white matter 2. No acute facial bone fracture. 3. Moderate right lower facial soft tissue swelling, could relate to trauma or cellulitis. No  focal fluid collections are seen in this region. Electronically Signed   By: Donavan Foil M.D.   On: 05/04/2017 20:46    DISCHARGE EXAMINATION: Vitals:   05/05/17 1300 05/05/17 1446 05/05/17 2040 05/06/17 0559  BP: 135/65 (!) 157/77 (!) 140/58 (!) 144/59  Pulse: (!) 58 77 77 66  Resp: 16 18 18 18   Temp:  99.6 F (37.6 C) 99 F (37.2 C) 97.9 F (36.6 C)  TempSrc:  Oral Oral Oral  SpO2: 98% 100% 100% 98%  Weight:  77.1 kg (170 lb)    Height:  5\' 6"  (1.676 m)     General appearance: alert, cooperative, distracted and no distress Facial swelling on the right side in the lower jaw area much improved.  Nontender.  No erythema noted today. Resp: clear to auscultation bilaterally Cardio: regular rate and rhythm, S1, S2 normal, no murmur, click, rub or gallop GI: soft, non-tender; bowel sounds normal; no masses,  no organomegaly  DISPOSITION: Back to her nursing facility  Discharge Instructions    Call MD for:  difficulty breathing, headache or visual disturbances    Complete by:  As directed    Call MD for:  extreme fatigue    Complete by:  As directed    Call MD for:  hives    Complete by:  As directed    Call MD for:  persistant dizziness or light-headedness    Complete by:  As directed    Call MD for:  persistant nausea and vomiting    Complete by:  As directed    Call MD for:  severe uncontrolled pain    Complete by:  As directed    Call MD for:  temperature >100.4    Complete by:  As directed    Discharge instructions    Complete by:  As directed    Please note that patient will need to be seen by a dentist as soon as possible.  Continue antibiotics as prescribed.  Seek attention if you develop significant diarrhea which means more than 3 bowel movements a day which are loose and watery.  Also seek attention if you develop abdominal pain nausea or vomiting or if the facial swelling worsens.  You were cared for by a hospitalist during your hospital stay. If you have any  questions about your discharge medications or the care you received while you were in the hospital after you are discharged, you can call the unit and asked to speak with the hospitalist on call if the hospitalist that took care of you is not available. Once you are discharged, your primary care physician will handle any further medical issues. Please note that NO REFILLS for any discharge medications will be authorized once you are discharged, as it is imperative that you return to your primary care physician (or establish a relationship with a primary care physician if you do not have one) for your aftercare needs so that they can reassess your need for medications and monitor your lab values. If you do not have a primary care physician, you can call [phone] for a physician referral.   Increase activity slowly    Complete by:  As directed       ALLERGIES: No Known Allergies   Current Discharge Medication List    START taking these medications   Details  clindamycin (CLEOCIN) 300 MG capsule Take 1 capsule (300 mg total) by mouth every 6 (six) hours. Qty: 24 capsule, Refills: 0    saccharomyces boulardii (FLORASTOR) 250 MG capsule Take 1 capsule (250 mg total) by mouth 2 (two) times daily. Qty: 30 capsule, Refills: 0      CONTINUE these medications which have NOT CHANGED   Details  acetaminophen (TYLENOL) 325 MG tablet Take 325-650 mg by mouth every 6 (six) hours as needed.     ALPRAZolam (XANAX) 0.5 MG tablet Take 0.5 mg by mouth 3 (three) times daily as needed for anxiety. For anxiety    donepezil (ARICEPT) 10 MG tablet Take 1 tablet (10 mg total) by mouth at bedtime. Qty: 30 tablet, Refills: 11    losartan (COZAAR) 100 MG tablet Take 100 mg by mouth daily.    memantine (NAMENDA) 10 MG tablet Take 1 tablet (10 mg total) by mouth 2 (two) times daily. Qty: 60 tablet, Refills: 11    Multiple Vitamin (MULTIVITAMIN WITH MINERALS) TABS tablet Take 1 tablet by mouth daily.      potassium chloride SA (K-DUR,KLOR-CON) 20 MEQ tablet Take 20 mEq by mouth daily. Reported on 10/12/2015    risperiDONE (RISPERDAL) 1 MG tablet Take 1 mg by mouth at bedtime.    simvastatin (ZOCOR) 20 MG tablet Take 20 mg by mouth daily.          Contact information for follow-up providers    Celene Squibb, MD. Schedule an appointment as soon as possible for a visit in 1 week(s).   Specialty:  Internal Medicine Contact information: [address] [place] [address] [phone]  Contact information for after-discharge care    [facility] Follow up.   Specialty:  Group Home Contact information: [place] [phone]                  TOTAL DISCHARGE TIME: 35 mins  [facility] Pager [phone]  05/06/2017, 10:01 AM

## 2017-05-06 NOTE — Discharge Instructions (Signed)

## 2017-08-23 ENCOUNTER — Emergency Department (HOSPITAL_COMMUNITY)
Admission: EM | Admit: 2017-08-23 | Discharge: 2017-08-24 | Disposition: A | Discharge status: Home / Self Care | Payer: Medicare Other | Attending: Emergency Medicine | Admitting: Emergency Medicine

## 2017-08-23 ENCOUNTER — Emergency Department (HOSPITAL_COMMUNITY): Payer: Medicare Other

## 2017-08-23 ENCOUNTER — Other Ambulatory Visit: Payer: Self-pay

## 2017-08-23 DIAGNOSIS — I10 Essential (primary) hypertension: Secondary | ICD-10-CM | POA: Insufficient documentation

## 2017-08-23 DIAGNOSIS — G309 Alzheimer's disease, unspecified: Secondary | ICD-10-CM | POA: Diagnosis not present

## 2017-08-23 DIAGNOSIS — I2510 Atherosclerotic heart disease of native coronary artery without angina pectoris: Secondary | ICD-10-CM | POA: Diagnosis not present

## 2017-08-23 DIAGNOSIS — R531 Weakness: Secondary | ICD-10-CM | POA: Diagnosis present

## 2017-08-23 DIAGNOSIS — Z79899 Other long term (current) drug therapy: Secondary | ICD-10-CM | POA: Diagnosis not present

## 2017-08-23 DIAGNOSIS — G479 Sleep disorder, unspecified: Secondary | ICD-10-CM | POA: Diagnosis not present

## 2017-08-23 DIAGNOSIS — Z87891 Personal history of nicotine dependence: Secondary | ICD-10-CM | POA: Diagnosis not present

## 2017-08-23 LAB — BASIC METABOLIC PANEL
Anion gap: 11 (ref 5–15)
BUN: 15 mg/dL (ref 6–20)
CALCIUM: 9.1 mg/dL (ref 8.9–10.3)
CO2: 24 mmol/L (ref 22–32)
Chloride: 101 mmol/L (ref 101–111)
Creatinine, Ser: 0.85 mg/dL (ref 0.44–1.00)
GFR calc Af Amer: >60 mL/min (ref >60)
Glucose, Bld: 113 mg/dL — ABNORMAL HIGH (ref 65–99)
Potassium: 3.9 mmol/L (ref 3.5–5.1)
Sodium: 136 mmol/L (ref 135–145)

## 2017-08-23 LAB — CBC
HCT: 32.7 % — ABNORMAL LOW (ref 36.0–46.0)
Hemoglobin: 10.8 g/dL — ABNORMAL LOW (ref 12.0–15.0)
MCH: 30.3 pg (ref 26.0–34.0)
MCHC: 33 g/dL (ref 30.0–36.0)
MCV: 91.6 fL (ref 78.0–100.0)
Platelets: 219 10*3/uL (ref 150–400)
RBC: 3.57 MIL/uL — ABNORMAL LOW (ref 3.87–5.11)
RDW: 12.9 % (ref 11.5–15.5)
WBC: 7.9 10*3/uL (ref 4.0–10.5)

## 2017-08-23 MED ORDER — SODIUM CHLORIDE 0.9 % IV BOLUS (SEPSIS)
500.0000 mL | Freq: Once | INTRAVENOUS | Status: AC
  Administered 2017-08-24: 500 mL via INTRAVENOUS

## 2017-08-23 NOTE — ED Triage Notes (Signed)
Pt is from [facility] in [place],  In by [facility] for generalized weakness and change in ability to walk x 1 week.  Pt has history of dementia, so poor historian.

## 2017-08-24 LAB — I-STAT CG4 LACTIC ACID, ED: LACTIC ACID, VENOUS: 1.47 mmol/L (ref 0.5–1.9)

## 2017-08-24 LAB — URINALYSIS, ROUTINE W REFLEX MICROSCOPIC
Bilirubin Urine: NEGATIVE
Glucose, UA: NEGATIVE mg/dL
Hgb urine dipstick: NEGATIVE
KETONES UR: NEGATIVE mg/dL
LEUKOCYTES UA: NEGATIVE
NITRITE: NEGATIVE
PROTEIN: NEGATIVE mg/dL
Specific Gravity, Urine: 1.002 — ABNORMAL LOW (ref 1.005–1.030)
pH: 6 (ref 5.0–8.0)

## 2017-08-24 LAB — CBG MONITORING, ED: GLUCOSE-CAPILLARY: 79 mg/dL (ref 65–99)

## 2017-08-24 LAB — INFLUENZA PANEL BY PCR (TYPE A & B)
INFLAPCR: NEGATIVE
Influenza B By PCR: NEGATIVE

## 2017-08-24 NOTE — ED Provider Notes (Signed)
[facility] EMERGENCY DEPARTMENT Provider Note   CSN: [id number] Arrival date & time: 08/23/17  2111     History   Chief Complaint Chief Complaint  Patient presents with  . Weakness    generalized    HPI [name] is a 84 y.o. female.  Patient brought to the ER for generalized weakness.  Patient had been doing well until today.  She has been sleeping more than usual and noted to be weak by assisted living staff, sent to the ER for further evaluation.  She has not had any recent illness.  No cough, congestion, vomiting, diarrhea.  Patient is without complaints.      Past Medical History:  Diagnosis Date  . Alzheimer's disease 09/11/2014  . Coronary artery disease   . Dementia   . High cholesterol   . Hypertension     Patient Active Problem List   Diagnosis Date Noted  . Right facial swelling   . Hypotension 04/25/2016  . Diarrhea 04/25/2016  . Acute renal failure ([place]) 04/25/2016  . Acute renal failure (ARF) ([place]) 04/25/2016  . Alzheimer's disease 09/11/2014  . Chest pain 05/24/2012  . Hypertension 08/30/2011  . High cholesterol 08/30/2011    Past Surgical History:  Procedure Laterality Date  . CATARACT EXTRACTION W/PHACO  12/13/2011   Procedure: CATARACT EXTRACTION PHACO AND INTRAOCULAR LENS PLACEMENT (IOC);  Surgeon: [name], MD;  Location: [facility] ORS;  Service: Ophthalmology;  Laterality: Right;  CDE 12.67  . CATARACT EXTRACTION W/PHACO  03/06/2012   Procedure: CATARACT EXTRACTION PHACO AND INTRAOCULAR LENS PLACEMENT (IOC);  Surgeon: [name], MD;  Location: [facility] ORS;  Service: Ophthalmology;  Laterality: Left;  CDE:13.12  . CHOLECYSTECTOMY    . COLONOSCOPY  09/28/2011   Procedure: COLONOSCOPY;  Surgeon: [name], MD;  Location: [facility] END[facility] SUITE;  Service: Endoscopy;  Laterality: N/A;  1200    OB History    No data available       Home Medications    Prior to Admission medications   Medication Sig Start Date End Date Taking?  Authorizing Provider  acetaminophen (TYLENOL) 325 MG tablet Take 325-650 mg by mouth every 6 (six) hours as needed.     [provider]  ALPRAZolam [name]) 0.5 MG tablet Take 0.5 mg by mouth 3 (three) times daily as needed for anxiety. For anxiety    [provider]  donepezil (ARICEPT) 10 MG tablet Take 1 tablet (10 mg total) by mouth at bedtime. 10/12/15   [name], NP  losartan (COZAAR) 100 MG tablet Take 100 mg by mouth daily.    [provider]  memantine (NAMENDA) 10 MG tablet Take 1 tablet (10 mg total) by mouth 2 (two) times daily. 10/12/15   [name], NP  Multiple Vitamin (MULTIVITAMIN WITH MINERALS) TABS tablet Take 1 tablet by mouth daily.    [provider]  potassium chloride SA (K-DUR,KLOR-CON) 20 MEQ tablet Take 20 mEq by mouth daily. Reported on 10/12/2015    [provider]  risperiDONE (RISPERDAL) 1 MG tablet Take 1 mg by mouth at bedtime.    [provider]  saccharomyces boulardii (FLORASTOR) 250 MG capsule Take 1 capsule (250 mg total) by mouth 2 (two) times daily. 05/06/17   Bonnielee Haff, MD  simvastatin (ZOCOR) 20 MG tablet Take 20 mg by mouth daily.    [provider]    Family History Family History  Problem Relation Age of Onset  . Dementia Sister   .  Dementia Brother     Social History Social History   Tobacco Use  . Smoking status: Former Smoker    Packs/day: 0.25    Years: 10.00    Pack years: 2.50    Last attempt to quit: 07/18/2007    Years since quitting: 10.1  . Smokeless tobacco: Never Used  Substance Use Topics  . Alcohol use: No    Alcohol/week: 0.0 oz  . Drug use: No     Allergies   Patient has no known allergies.   Review of Systems Review of Systems  Constitutional: Positive for fatigue.  All other systems reviewed and are negative.    Physical Exam Updated Vital Signs BP (!) 117/48   Pulse 69   Temp 97.9 F (36.6 C) (Oral)   Resp 13   SpO2 100%     Physical Exam  Constitutional: She appears well-developed and well-nourished. No distress.  HENT:  Head: Normocephalic and atraumatic.  Right Ear: Hearing normal.  Left Ear: Hearing normal.  Nose: Nose normal.  Mouth/Throat: Oropharynx is clear and moist and mucous membranes are normal.  Eyes: Conjunctivae and EOM are normal. Pupils are equal, round, and reactive to light.  Neck: Normal range of motion. Neck supple.  Cardiovascular: Regular rhythm, S1 normal and S2 normal. Exam reveals no gallop and no friction rub.  No murmur heard. Pulmonary/Chest: Effort normal and breath sounds normal. No respiratory distress. She exhibits no tenderness.  Abdominal: Soft. Normal appearance and bowel sounds are normal. There is no hepatosplenomegaly. There is no tenderness. There is no rebound, no guarding, no tenderness at McBurney's point and negative Murphy's sign. No hernia.  Musculoskeletal: Normal range of motion.  Neurological: She is alert. She has normal strength. No cranial nerve deficit or sensory deficit. Coordination normal. GCS eye subscore is 4. GCS verbal subscore is 5. GCS motor subscore is 6.  Skin: Skin is warm, dry and intact. No rash noted. No cyanosis.  Psychiatric: She has a normal mood and affect. Her speech is normal and behavior is normal. Thought content normal.  Nursing note and vitals reviewed.    ED Treatments / Results  Labs (all labs ordered are listed, but only abnormal results are displayed) Labs Reviewed  BASIC METABOLIC PANEL - Abnormal; Notable for the following components:      Result Value   Glucose, Bld 113 (*)    All other components within normal limits  CBC - Abnormal; Notable for the following components:   RBC 3.57 (*)    Hemoglobin 10.8 (*)    HCT 32.7 (*)    All other components within normal limits  URINALYSIS, ROUTINE W REFLEX MICROSCOPIC - Abnormal; Notable for the following components:   Color, Urine STRAW (*)    Specific Gravity, Urine  1.002 (*)    All other components within normal limits  INFLUENZA PANEL BY PCR (TYPE A & B)  CBG MONITORING, ED  I-STAT CG4 LACTIC ACID, ED    EKG  EKG Interpretation None       Radiology Dg Chest Port 1 View  Result Date: 08/23/2017 CLINICAL DATA:  Generalized weakness EXAM: PORTABLE CHEST 1 VIEW COMPARISON:  05/04/2017 FINDINGS: Coarse chronic interstitial opacity. No acute consolidation or effusion. Stable mild cardiomegaly with aortic atherosclerosis. No pneumothorax. IMPRESSION: No active disease. Stable mild cardiomegaly and coarse chronic appearing interstitial opacity. Electronically Signed   By: Donavan Foil M.D.   On: 08/23/2017 23:52    Procedures Procedures (including critical care time)  Medications Ordered  in ED Medications  sodium chloride 0.9 % bolus 500 mL (0 mLs Intravenous Stopped 08/24/17 0103)     Initial Impression / Assessment and Plan / ED Course  I have reviewed the triage vital signs and the nursing notes.  Pertinent labs & imaging results that were available during my care of the patient were reviewed by me and considered in my medical decision making (see chart for details).     Patient sleeping at arrival to the ER.  She awakens to voice.  She has had her normal confused and demented baseline.  She does answer questions, denies any pain.  Examination is nonfocal.  She does not have any evidence of abdominal pain or tenderness on examination.  No unilateral or focal neurologic deficits.  Her workup has been entirely unremarkable.  This includes blood work, urinalysis.  She was recently diagnosed with UTI, is currently being treated.  There does not appear to be persistent urinary tract infection currently.  Chest x-ray does not show evidence of pneumonia or any other abnormality.  She was able to ambulate here in the ER but did require assistance.  She normally ambulates alone.  I discussed with the family disposition.  They did not wish to have her  admitted at this time.  They felt that she could be cared for in her current living arrangement.  The patient was given some IV fluids here in the ER and did seem to perk up.  She did not have any renal failure or sign of severe dehydration.  Family was counseled to see how she is doing tomorrow, return if she has any persistent symptoms.  Final Clinical Impressions(s) / ED Diagnoses   Final diagnoses:  Generalized weakness    ED Discharge Orders    None       Ladajah Soltys, Gwenyth Allegra, MD 08/24/17 [phone]

## 2017-08-24 NOTE — ED Notes (Signed)
Pt ambulating in hallway with assistance

## 2017-09-01 ENCOUNTER — Emergency Department (HOSPITAL_COMMUNITY): Payer: Medicare Other

## 2017-09-01 ENCOUNTER — Emergency Department (HOSPITAL_COMMUNITY)
Admission: EM | Admit: 2017-09-01 | Discharge: 2017-09-02 | Disposition: A | Discharge status: Home / Self Care | Payer: Medicare Other | Attending: Emergency Medicine | Admitting: Emergency Medicine

## 2017-09-01 ENCOUNTER — Other Ambulatory Visit: Payer: Self-pay

## 2017-09-01 ENCOUNTER — Encounter (HOSPITAL_COMMUNITY): Payer: Self-pay

## 2017-09-01 DIAGNOSIS — S32401A Unspecified fracture of right acetabulum, initial encounter for closed fracture: Secondary | ICD-10-CM | POA: Diagnosis not present

## 2017-09-01 DIAGNOSIS — I2510 Atherosclerotic heart disease of native coronary artery without angina pectoris: Secondary | ICD-10-CM | POA: Diagnosis not present

## 2017-09-01 DIAGNOSIS — Z79899 Other long term (current) drug therapy: Secondary | ICD-10-CM | POA: Diagnosis not present

## 2017-09-01 DIAGNOSIS — Z87891 Personal history of nicotine dependence: Secondary | ICD-10-CM | POA: Insufficient documentation

## 2017-09-01 DIAGNOSIS — S32591A Other specified fracture of right pubis, initial encounter for closed fracture: Secondary | ICD-10-CM

## 2017-09-01 DIAGNOSIS — S79821A Other specified injuries of right thigh, initial encounter: Secondary | ICD-10-CM | POA: Diagnosis present

## 2017-09-01 DIAGNOSIS — Y999 Unspecified external cause status: Secondary | ICD-10-CM | POA: Diagnosis not present

## 2017-09-01 DIAGNOSIS — I10 Essential (primary) hypertension: Secondary | ICD-10-CM | POA: Diagnosis not present

## 2017-09-01 DIAGNOSIS — W06XXXA Fall from bed, initial encounter: Secondary | ICD-10-CM | POA: Insufficient documentation

## 2017-09-01 DIAGNOSIS — Y939 Activity, unspecified: Secondary | ICD-10-CM | POA: Insufficient documentation

## 2017-09-01 DIAGNOSIS — W19XXXA Unspecified fall, initial encounter: Secondary | ICD-10-CM

## 2017-09-01 DIAGNOSIS — Y92122 Bedroom in nursing home as the place of occurrence of the external cause: Secondary | ICD-10-CM | POA: Insufficient documentation

## 2017-09-01 DIAGNOSIS — F0390 Unspecified dementia without behavioral disturbance: Secondary | ICD-10-CM | POA: Insufficient documentation

## 2017-09-01 DIAGNOSIS — R52 Pain, unspecified: Secondary | ICD-10-CM

## 2017-09-01 NOTE — Discharge Instructions (Addendum)
[name] has fractured her pelvis as we discussed.  The orthopedic surgery recommended no weightbearing.  You can follow-up with him for further information.  Phone number given.  Tylenol for pain.

## 2017-09-01 NOTE — ED Notes (Signed)
Pt resting comfortably with family at bedside

## 2017-09-01 NOTE — ED Provider Notes (Addendum)
[facility] EMERGENCY DEPARTMENT Provider Note   CSN: [id number] Arrival date & time: 09/01/17  1628     History   Chief Complaint Chief Complaint  Patient presents with  . Fall    HPI CARIANA KARGE is a 84 y.o. female.  Level 5 caveat for dementia.  Patient apparently fell out of the bed last night at her rest home.  Family reports no change in behavior.  She does complain of mid anterior right thigh pain.  No other obvious injuries.  No head or neck trauma.  No prodromal illnesses.      Past Medical History:  Diagnosis Date  . Alzheimer's disease 09/11/2014  . Coronary artery disease   . Dementia   . High cholesterol   . Hypertension     Patient Active Problem List   Diagnosis Date Noted  . Right facial swelling   . Hypotension 04/25/2016  . Diarrhea 04/25/2016  . Acute renal failure ([place]) 04/25/2016  . Acute renal failure (ARF) ([place]) 04/25/2016  . Alzheimer's disease 09/11/2014  . Chest pain 05/24/2012  . Hypertension 08/30/2011  . High cholesterol 08/30/2011    Past Surgical History:  Procedure Laterality Date  . CATARACT EXTRACTION W/PHACO  12/13/2011   Procedure: CATARACT EXTRACTION PHACO AND INTRAOCULAR LENS PLACEMENT (IOC);  Surgeon: [name], MD;  Location: [facility];  Service: Ophthalmology;  Laterality: Right;  CDE 12.67  . CATARACT EXTRACTION W/PHACO  03/06/2012   Procedure: CATARACT EXTRACTION PHACO AND INTRAOCULAR LENS PLACEMENT (IOC);  Surgeon: [name], MD;  Location: [facility];  Service: Ophthalmology;  Laterality: Left;  CDE:13.12  . CHOLECYSTECTOMY    . COLONOSCOPY  09/28/2011   Procedure: COLONOSCOPY;  Surgeon: [name], MD;  Location: [facility];  Service: Endoscopy;  Laterality: N/A;  1200    OB History    No data available       Home Medications    Prior to Admission medications   Medication Sig Start Date End Date Taking? Authorizing Provider  acetaminophen (TYLENOL) 325 MG tablet Take 325-650 mg by mouth  every 6 (six) hours as needed.    Yes [provider]  ALPRAZolam [name]) 0.5 MG tablet Take 0.5 mg by mouth 3 (three) times daily as needed for anxiety. For anxiety   Yes [provider]  donepezil (ARICEPT) 10 MG tablet Take 1 tablet (10 mg total) by mouth at bedtime. 10/12/15  Yes [name], NP  losartan (COZAAR) 100 MG tablet Take 100 mg by mouth daily.   Yes [provider]  memantine (NAMENDA) 10 MG tablet Take 1 tablet (10 mg total) by mouth 2 (two) times daily. 10/12/15  Yes [name], NP  Multiple Vitamin (MULTIVITAMIN WITH MINERALS) TABS tablet Take 1 tablet by mouth daily.   Yes [provider]  potassium chloride SA (K-DUR,KLOR-CON) 20 MEQ tablet Take 20 mEq by mouth daily. Reported on 10/12/2015   Yes [provider]  risperiDONE (RISPERDAL) 1 MG tablet Take 1 mg by mouth at bedtime.   Yes [provider]  saccharomyces boulardii (FLORASTOR) 250 MG capsule Take 1 capsule (250 mg total) by mouth 2 (two) times daily. 05/06/17  Yes Bonnielee Haff, MD  simvastatin (ZOCOR) 20 MG tablet Take 20 mg by mouth daily.   Yes [provider]    Family History Family History  Problem Relation Age of Onset  . Dementia Sister   . Dementia Brother     Social History Social History  Tobacco Use  . Smoking status: Former Smoker    Packs/day: 0.25    Years: 10.00    Pack years: 2.50    Last attempt to quit: 07/18/2007    Years since quitting: 10.1  . Smokeless tobacco: Never Used  Substance Use Topics  . Alcohol use: No    Alcohol/week: 0.0 oz  . Drug use: No     Allergies   Patient has no known allergies.   Review of Systems Review of Systems  Unable to perform ROS: Dementia     Physical Exam Updated Vital Signs BP (!) 132/54   Pulse 84   Temp 98.5 F (36.9 C) (Oral)   Resp 18   Ht 5\' 3"  (1.6 m)   Wt 77.1 kg (170 lb)   SpO2 100%   BMI 30.11 kg/m   Physical Exam  Constitutional: She is  oriented to person, place, and time.  Demented, nad  HENT:  Head: Normocephalic and atraumatic.  Eyes: Conjunctivae are normal.  Neck: Neck supple.  Cardiovascular: Normal rate and regular rhythm.  Pulmonary/Chest: Effort normal and breath sounds normal.  Abdominal: Soft. Bowel sounds are normal.  Musculoskeletal:  Right lower extremity: Minimal anterior thigh tenderness.  Neurological: She is alert and oriented to person, place, and time.  Skin: Skin is warm and dry.  Psychiatric: She has a normal mood and affect. Her behavior is normal.  Nursing note and vitals reviewed.    ED Treatments / Results  Labs (all labs ordered are listed, but only abnormal results are displayed) Labs Reviewed - No data to display  EKG  EKG Interpretation None       Radiology Dg Chest 1 View  Result Date: 09/01/2017 CLINICAL DATA:  Fall. EXAM: CHEST 1 VIEW COMPARISON:  Chest x-rays since June 19, 2016 FINDINGS: The heart size is borderline and stable. There is a torturous thoracic aorta which is stable. No pneumothorax. No nodules, masses, or infiltrates. No other acute abnormalities. IMPRESSION: No active disease. Electronically Signed   By: Dorise Bullion III M.D   On: 09/01/2017 18:12   Ct Hip Right Wo Contrast  Result Date: 09/01/2017 CLINICAL DATA:  Patient fell out of bed last evening and presents with right hip pain. EXAM: CT OF THE RIGHT HIP WITHOUT CONTRAST TECHNIQUE: Multidetector CT imaging of the right hip was performed according to the standard protocol. Multiplanar CT image reconstructions were also generated. COMPARISON:  09/01/2017 radiographs of the pelvis and right hip. FINDINGS: Bones/Joint/Cartilage There is an acute comminuted fracture of the roof of the right acetabulum with fractures involving anterior and posterior columns of the acetabulum, superior pubic ramus at the junction with the acetabulum as well as a comminuted angulated and displaced fracture of the right  inferior pubic ramus. There is slight medial displacement of the uppermost medial aspect of the roof. Slight protrusio of the right femoral head. The left hip and pubic rami appear intact. No pelvic diastasis. Vertebral body hemangioma of L4. Multilevel degenerative facet arthropathy of the included lower lumbar spine. Intact sacrum and coccyx. No proximal femoral fracture. Ligaments Suboptimally assessed by CT. Muscles and Tendons Posttraumatic swelling of right obturator internus muscle and iliacus. No active hemorrhage noted. No significant joint effusion. Soft tissues Soft tissue contusion overlying lateral aspect of the right hip. No abnormal fluid collection or hematoma. IMPRESSION: 1. Right femoral head protrusio secondary to a comminuted anterior and posterior column fractures of the acetabulum with fracture extending to the iliac wing to involve the anterior  column and faintly visualized fracture extending to the sciatic notch suggesting posterior column involvement. Right superior and inferior pubic rami fractures as above described. 2. No proximal femoral fracture. 3. Vertebral hemangioma of L4. Lower lumbar facet arthropathy. No acute lumbar spine fracture. Electronically Signed   By: Ashley Royalty M.D.   On: 09/01/2017 22:16   Dg Hip Unilat W Or Wo Pelvis 2-3 Views Right  Result Date: 09/01/2017 CLINICAL DATA:  [place] out of bed last night. Right leg pain. Initial encounter. EXAM: DG HIP (WITH OR WITHOUT PELVIS) 2-3V RIGHT COMPARISON:  Abdominal radiographs 06/19/2016. Pelvic and left hip radiographs 02/26/2013. FINDINGS: The bones appear diffusely osteopenic. Cross table lateral radiographs are limited by patient body habitus and underpenetration. There is a right acetabular fracture with medial displacement of a dominant fracture fragment. The right femoral head is also medially positioned through the region of the fracture without gross dislocation. There are right inferior and possibly superior  pubic rami fractures. IMPRESSION: 1. Displaced right acetabular fracture. 2. Right inferior and possible superior pubic rami fractures. Electronically Signed   By: Logan Bores M.D.   On: 09/01/2017 18:14   Dg Femur Min 2 Views Right  Result Date: 09/01/2017 CLINICAL DATA:  84 year old female status post fall out of bed last night with right leg pain. Right acetabular and pubic rami fractures seen on earlier pelvis and right hip series. EXAM: RIGHT FEMUR 2 VIEWS COMPARISON:  Pelvis and right hip series 17 20 hr today. FINDINGS: Osteopenia. Displaced right acetabular fracture and or mildly displaced right pubic rami fractures are redemonstrated. Stable right femoral head. The proximal right femur including the intertrochanteric segment appears stable and intact. The right femoral shaft is intact. The distal right femur appears intact. Preserved alignment at the right knee. Calcified peripheral vascular disease in the right lower extremity. IMPRESSION: 1. Right acetabular and pubic rami fractures redemonstrated. 2. No right femur fracture identified. Electronically Signed   By: Genevie Ann M.D.   On: 09/01/2017 20:13    Procedures Procedures (including critical care time)  Medications Ordered in ED Medications - No data to display   Initial Impression / Assessment and Plan / ED Course  I have reviewed the triage vital signs and the nursing notes.  Pertinent labs & imaging results that were available during my care of the patient were reviewed by me and considered in my medical decision making (see chart for details).     Demented patient accidentally fell out of bed last night.  Plain films of right hip and right femur reveal a displaced right acetabular fracture and a right superior pubic rami fracture.  This was discussed with the general orthopedic surgeon on call Dr. Aline Brochure.  He recommended a CT scan of the hip.  Plain films of the right hip and CT scan of the right hip were discussed with the  orthopedic surgeon on call.  He recommended no weightbearing at this time.  I discussed the diagnosis in great detail with the family.  She will be discharged back to her facility.  Follow-up with Dr. Aline Brochure.  Final Clinical Impressions(s) / ED Diagnoses   Final diagnoses:  Fall, initial encounter  Closed displaced fracture of right acetabulum, unspecified portion of acetabulum, initial encounter [facility])  Fracture of multiple pubic rami, right, closed, initial encounter [facility])    ED Discharge Orders    None       Nat Christen, MD 09/01/17 2132    Nat Christen, MD 09/01/17 2220

## 2017-09-01 NOTE — Progress Notes (Signed)
Emergency room conversation with Dr. Kermit Balo  Patient with right hip pain after falling out of bed.  Patient reportedly is an ambulator and actually walked on the leg today presents with right hip and groin pain  Initial x-rays of the pelvis and femur show right acetabular fracture with pubic ramus fracture  CT scan confirms the fracture was a central displaced fracture with medial wall involvement as well as probably anterior column  The patient should be nonweightbearing for 6 weeks with follow-up x-rays in about 2 weeks  She is probably not a surgical candidate.

## 2017-09-01 NOTE — ED Notes (Signed)
Pt transported to xray 

## 2017-09-01 NOTE — ED Triage Notes (Addendum)
Pt brought in by son from [facility]. Pt fell out of bed last night . Pt says right leg is hurting. Pt has dementia. Son reports pt has walked since fall. Pt shows signs of pain with movement of right hip and upper leg

## 2017-09-08 ENCOUNTER — Telehealth: Payer: Self-pay | Admitting: Orthopedic Surgery

## 2017-09-08 NOTE — Telephone Encounter (Signed)
Dr. Aline Brochure consulted this patient on 09/01/17.  She has an appointment on 09/19/17 here in the office per his direction. I spoke to Pervis Hocking from [facility] where this patient resides. I gave h im the follow up appointment for this lady.  He states needs to ask a few questions regarding pt care.   Would you please call him at [phone]  Thanks

## 2017-09-08 NOTE — Telephone Encounter (Signed)
The patient should be nonweightbearing for 6 weeks with follow-up x-rays in about 2 weeks  [phone] fax number to facility. They need you to write orders and fax to them regarding her restrictions  I have asked them to send [place] a fax order sheet, will have you sign

## 2017-09-13 ENCOUNTER — Encounter (HOSPITAL_COMMUNITY): Payer: Self-pay

## 2017-09-13 ENCOUNTER — Telehealth: Payer: Self-pay | Admitting: Orthopedic Surgery

## 2017-09-13 ENCOUNTER — Observation Stay (HOSPITAL_COMMUNITY)
Admission: EM | Admit: 2017-09-13 | Discharge: 2017-09-14 | Disposition: A | Discharge status: Home / Self Care | Payer: Medicare Other | Attending: Family Medicine | Admitting: Family Medicine

## 2017-09-13 ENCOUNTER — Other Ambulatory Visit: Payer: Self-pay

## 2017-09-13 ENCOUNTER — Emergency Department (HOSPITAL_COMMUNITY): Payer: Medicare Other

## 2017-09-13 DIAGNOSIS — Z79899 Other long term (current) drug therapy: Secondary | ICD-10-CM | POA: Insufficient documentation

## 2017-09-13 DIAGNOSIS — Z87891 Personal history of nicotine dependence: Secondary | ICD-10-CM | POA: Diagnosis not present

## 2017-09-13 DIAGNOSIS — S72001A Fracture of unspecified part of neck of right femur, initial encounter for closed fracture: Secondary | ICD-10-CM

## 2017-09-13 DIAGNOSIS — I2510 Atherosclerotic heart disease of native coronary artery without angina pectoris: Secondary | ICD-10-CM | POA: Insufficient documentation

## 2017-09-13 DIAGNOSIS — S72009A Fracture of unspecified part of neck of unspecified femur, initial encounter for closed fracture: Secondary | ICD-10-CM

## 2017-09-13 DIAGNOSIS — S32401A Unspecified fracture of right acetabulum, initial encounter for closed fracture: Secondary | ICD-10-CM

## 2017-09-13 DIAGNOSIS — G309 Alzheimer's disease, unspecified: Secondary | ICD-10-CM

## 2017-09-13 DIAGNOSIS — W19XXXD Unspecified fall, subsequent encounter: Secondary | ICD-10-CM | POA: Insufficient documentation

## 2017-09-13 DIAGNOSIS — G308 Other Alzheimer's disease: Secondary | ICD-10-CM | POA: Insufficient documentation

## 2017-09-13 DIAGNOSIS — I10 Essential (primary) hypertension: Secondary | ICD-10-CM | POA: Diagnosis not present

## 2017-09-13 DIAGNOSIS — F0280 Dementia in other diseases classified elsewhere without behavioral disturbance: Secondary | ICD-10-CM | POA: Diagnosis present

## 2017-09-13 DIAGNOSIS — F0281 Dementia in other diseases classified elsewhere with behavioral disturbance: Secondary | ICD-10-CM | POA: Diagnosis not present

## 2017-09-13 DIAGNOSIS — M25559 Pain in unspecified hip: Secondary | ICD-10-CM

## 2017-09-13 DIAGNOSIS — S32401G Unspecified fracture of right acetabulum, subsequent encounter for fracture with delayed healing: Secondary | ICD-10-CM | POA: Diagnosis present

## 2017-09-13 DIAGNOSIS — D649 Anemia, unspecified: Secondary | ICD-10-CM | POA: Diagnosis not present

## 2017-09-13 DIAGNOSIS — E782 Mixed hyperlipidemia: Secondary | ICD-10-CM | POA: Insufficient documentation

## 2017-09-13 DIAGNOSIS — E7800 Pure hypercholesterolemia, unspecified: Secondary | ICD-10-CM | POA: Diagnosis present

## 2017-09-13 DIAGNOSIS — M25551 Pain in right hip: Secondary | ICD-10-CM

## 2017-09-13 LAB — CBC WITH DIFFERENTIAL/PLATELET
BASOS ABS: 0 10*3/uL (ref 0.0–0.1)
Basophils Relative: 0 %
EOS PCT: 0 %
Eosinophils Absolute: 0 10*3/uL (ref 0.0–0.7)
HCT: 30.7 % — ABNORMAL LOW (ref 36.0–46.0)
Hemoglobin: 10 g/dL — ABNORMAL LOW (ref 12.0–15.0)
LYMPHS PCT: 12 %
Lymphs Abs: 1.6 10*3/uL (ref 0.7–4.0)
MCH: 30.2 pg (ref 26.0–34.0)
MCHC: 32.6 g/dL (ref 30.0–36.0)
MCV: 92.7 fL (ref 78.0–100.0)
MONO ABS: 1 10*3/uL (ref 0.1–1.0)
MONOS PCT: 7 %
Neutro Abs: 10.7 10*3/uL — ABNORMAL HIGH (ref 1.7–7.7)
Neutrophils Relative %: 81 %
PLATELETS: 336 10*3/uL (ref 150–400)
RBC: 3.31 MIL/uL — ABNORMAL LOW (ref 3.87–5.11)
RDW: 12.9 % (ref 11.5–15.5)
WBC: 13.3 10*3/uL — ABNORMAL HIGH (ref 4.0–10.5)

## 2017-09-13 LAB — TROPONIN I: Troponin I: <0.03 ng/mL (ref <0.03)

## 2017-09-13 LAB — BASIC METABOLIC PANEL
Anion gap: 11 (ref 5–15)
BUN: 39 mg/dL — AB (ref 6–20)
CO2: 24 mmol/L (ref 22–32)
Calcium: 8.8 mg/dL — ABNORMAL LOW (ref 8.9–10.3)
Chloride: 100 mmol/L — ABNORMAL LOW (ref 101–111)
Creatinine, Ser: 0.92 mg/dL (ref 0.44–1.00)
GFR calc Af Amer: >60 mL/min (ref >60)
GFR, EST NON AFRICAN AMERICAN: 55 mL/min — AB (ref >60)
GLUCOSE: 125 mg/dL — AB (ref 65–99)
POTASSIUM: 4.8 mmol/L (ref 3.5–5.1)
Sodium: 135 mmol/L (ref 135–145)

## 2017-09-13 MED ORDER — ALPRAZOLAM 0.5 MG PO TABS: 0.5000 mg | ORAL_TABLET | Freq: Three times a day (TID) | ORAL | Status: DC | PRN

## 2017-09-13 MED ORDER — SODIUM CHLORIDE 0.9 % IV SOLN: 250.0000 mL | INTRAVENOUS | Status: DC | PRN

## 2017-09-13 MED ORDER — LOSARTAN POTASSIUM 50 MG PO TABS
100.0000 mg | ORAL_TABLET | Freq: Every day | ORAL | Status: DC
  Administered 2017-09-14: 100 mg via ORAL
  Filled 2017-09-13: qty 2

## 2017-09-13 MED ORDER — SACCHAROMYCES BOULARDII 250 MG PO CAPS
250.0000 mg | ORAL_CAPSULE | Freq: Two times a day (BID) | ORAL | Status: DC
  Administered 2017-09-14 (×2): 250 mg via ORAL
  Filled 2017-09-13 (×2): qty 1

## 2017-09-13 MED ORDER — POTASSIUM CHLORIDE CRYS ER 20 MEQ PO TBCR
20.0000 meq | EXTENDED_RELEASE_TABLET | Freq: Every day | ORAL | Status: DC
  Administered 2017-09-14: 20 meq via ORAL
  Filled 2017-09-13: qty 1

## 2017-09-13 MED ORDER — ENOXAPARIN SODIUM 40 MG/0.4ML ~~LOC~~ SOLN
40.0000 mg | SUBCUTANEOUS | Status: DC
  Administered 2017-09-14: 40 mg via SUBCUTANEOUS
  Filled 2017-09-13: qty 0.4

## 2017-09-13 MED ORDER — ONDANSETRON HCL 4 MG PO TABS: 4.0000 mg | ORAL_TABLET | Freq: Four times a day (QID) | ORAL | Status: DC | PRN

## 2017-09-13 MED ORDER — SODIUM CHLORIDE 0.9% FLUSH: 3.0000 mL | INTRAVENOUS | Status: DC | PRN

## 2017-09-13 MED ORDER — SIMVASTATIN 10 MG PO TABS
20.0000 mg | ORAL_TABLET | Freq: Every day | ORAL | Status: DC
  Administered 2017-09-14: 20 mg via ORAL
  Filled 2017-09-13: qty 2

## 2017-09-13 MED ORDER — ADULT MULTIVITAMIN W/MINERALS CH
1.0000 | ORAL_TABLET | Freq: Every day | ORAL | Status: DC
  Administered 2017-09-14: 1 via ORAL
  Filled 2017-09-13: qty 1

## 2017-09-13 MED ORDER — RISPERIDONE 1 MG PO TABS
1.0000 mg | ORAL_TABLET | Freq: Every day | ORAL | Status: DC
  Administered 2017-09-14: 1 mg via ORAL
  Filled 2017-09-13: qty 1

## 2017-09-13 MED ORDER — SODIUM CHLORIDE 0.9% FLUSH
3.0000 mL | Freq: Two times a day (BID) | INTRAVENOUS | Status: DC
  Administered 2017-09-14 (×2): 3 mL via INTRAVENOUS

## 2017-09-13 MED ORDER — MEMANTINE HCL 10 MG PO TABS
10.0000 mg | ORAL_TABLET | Freq: Two times a day (BID) | ORAL | Status: DC
  Administered 2017-09-14: 10 mg via ORAL
  Filled 2017-09-13: qty 1

## 2017-09-13 MED ORDER — DONEPEZIL HCL 5 MG PO TABS
10.0000 mg | ORAL_TABLET | Freq: Every day | ORAL | Status: DC
  Administered 2017-09-14: 10 mg via ORAL
  Filled 2017-09-13 (×2): qty 1
  Filled 2017-09-13: qty 2

## 2017-09-13 MED ORDER — ONDANSETRON HCL 4 MG/2ML IJ SOLN: 4.0000 mg | Freq: Four times a day (QID) | INTRAMUSCULAR | Status: DC | PRN

## 2017-09-13 MED ORDER — ACETAMINOPHEN 325 MG PO TABS: 325.0000 mg | ORAL_TABLET | Freq: Four times a day (QID) | ORAL | Status: DC | PRN

## 2017-09-13 NOTE — Telephone Encounter (Signed)
Pt's caregiver, Pervis Hocking, housing supervisor and care giver from [facility] has some questions regarding this patient's care.  He states his facility is an assisted living facility and not a nursing facility.  He wants to speak to someone regarding pt's care and if she needs to be in a nursing facility or not.  Please call him at [phone]  Thanks so much

## 2017-09-13 NOTE — ED Provider Notes (Signed)
[facility] EMERGENCY DEPARTMENT Provider Note   CSN: [id number] Arrival date & time: 09/13/17  1945     History   Chief Complaint Chief Complaint  Patient presents with  . Hip Pain    HPI [name] is a 84 y.o. female.  The history is provided by the EMS personnel and the nursing home. The history is limited by the condition of the patient (Hx dementia).  Hip Pain   Pt was seen at 2000. Per EMS and NH report: Pt c/o right hip pain. Pt is s/p fall 2 weeks ago with dx acetabular and pubic rami fractures. Pt was instructed to be non-weightbearing for 6 weeks, then f/u XR in 2 weeks. No reported new fall or injury.    Past Medical History:  Diagnosis Date  . Alzheimer's disease 09/11/2014  . Coronary artery disease   . Dementia   . High cholesterol   . Hypertension     Patient Active Problem List   Diagnosis Date Noted  . Right facial swelling   . Hypotension 04/25/2016  . Diarrhea 04/25/2016  . Acute renal failure ([place]) 04/25/2016  . Acute renal failure (ARF) ([place]) 04/25/2016  . Alzheimer's disease 09/11/2014  . Chest pain 05/24/2012  . Hypertension 08/30/2011  . High cholesterol 08/30/2011    Past Surgical History:  Procedure Laterality Date  . CATARACT EXTRACTION W/PHACO  12/13/2011   Procedure: CATARACT EXTRACTION PHACO AND INTRAOCULAR LENS PLACEMENT (IOC);  Surgeon: [name], MD;  Location: [facility];  Service: Ophthalmology;  Laterality: Right;  CDE 12.67  . CATARACT EXTRACTION W/PHACO  03/06/2012   Procedure: CATARACT EXTRACTION PHACO AND INTRAOCULAR LENS PLACEMENT (IOC);  Surgeon: [name], MD;  Location: [facility];  Service: Ophthalmology;  Laterality: Left;  CDE:13.12  . CHOLECYSTECTOMY    . COLONOSCOPY  09/28/2011   Procedure: COLONOSCOPY;  Surgeon: [name], MD;  Location: [facility];  Service: Endoscopy;  Laterality: N/A;  1200    OB History    No data available       Home Medications    Prior to Admission medications    Medication Sig Start Date End Date Taking? Authorizing Provider  acetaminophen (TYLENOL) 325 MG tablet Take 325-650 mg by mouth every 6 (six) hours as needed.     [provider]  ALPRAZolam [name]) 0.5 MG tablet Take 0.5 mg by mouth 3 (three) times daily as needed for anxiety. For anxiety    [provider]  donepezil (ARICEPT) 10 MG tablet Take 1 tablet (10 mg total) by mouth at bedtime. 10/12/15   [name], NP  losartan (COZAAR) 100 MG tablet Take 100 mg by mouth daily.    [provider]  memantine (NAMENDA) 10 MG tablet Take 1 tablet (10 mg total) by mouth 2 (two) times daily. 10/12/15   [name], NP  Multiple Vitamin (MULTIVITAMIN WITH MINERALS) TABS tablet Take 1 tablet by mouth daily.    [provider]  potassium chloride SA (K-DUR,KLOR-CON) 20 MEQ tablet Take 20 mEq by mouth daily. Reported on 10/12/2015    [provider]  risperiDONE (RISPERDAL) 1 MG tablet Take 1 mg by mouth at bedtime.    [provider]  saccharomyces boulardii (FLORASTOR) 250 MG capsule Take 1 capsule (250 mg total) by mouth 2 (two) times daily. 05/06/17   Bonnielee Haff, MD  simvastatin (ZOCOR) 20 MG tablet Take 20 mg by mouth daily.    [provider]    Family History  Family History  Problem Relation Age of Onset  . Dementia Sister   . Dementia Brother     Social History Social History   Tobacco Use  . Smoking status: Former Smoker    Packs/day: 0.25    Years: 10.00    Pack years: 2.50    Last attempt to quit: 07/18/2007    Years since quitting: 10.1  . Smokeless tobacco: Never Used  Substance Use Topics  . Alcohol use: No    Alcohol/week: 0.0 oz  . Drug use: No     Allergies   Patient has no known allergies.   Review of Systems Review of Systems  Unable to perform ROS: Dementia     Physical Exam Updated Vital Signs BP (!) 115/41 (BP Location: Left Arm)   Pulse 81   Temp 97.8 F (36.6 C) (Oral)   Resp  17   SpO2 94%   Physical Exam 2005: Physical examination:  Nursing notes reviewed; Vital signs and O2 SAT reviewed;  Constitutional: Well developed, Well nourished, Well hydrated, In no acute distress; Head:  Normocephalic, atraumatic; Eyes: EOMI, PERRL, No scleral icterus; ENMT: Mouth and pharynx normal, Mucous membranes moist; Neck: Supple, Full range of motion, No lymphadenopathy; Cardiovascular: Regular rate and rhythm, No gallop; Respiratory: Breath sounds clear & equal bilaterally, No rwheezes. Speaking full sentences with ease, Normal respiratory effort/excursion; Chest: Nontender, Movement normal; Abdomen: Soft, Nontender, Nondistended, Normal bowel sounds; Genitourinary: No CVA tenderness; Extremities: Pulses normal, +right hip tenderness to palp. Pt moves RLE on stretcher spontaneously. NT right knee/ankle/foot. No edema, No calf edema or asymmetry.; Neuro: Awake, alert, confused per hx dementia. No facial droop.  Speech clear. No gross focal motor deficits in extremities.; Skin: Color normal, Warm, Dry.   ED Treatments / Results  Labs (all labs ordered are listed, but only abnormal results are displayed)   EKG  EKG Interpretation None       Radiology   Procedures Procedures (including critical care time)  Medications Ordered in ED Medications - No data to display   Initial Impression / Assessment and Plan / ED Course  I have reviewed the triage vital signs and the nursing notes.  Pertinent labs & imaging results that were available during my care of the patient were reviewed by me and considered in my medical decision making (see chart for details).  MDM Reviewed: previous chart, nursing note and vitals Reviewed previous: x-ray, CT scan, labs and ECG Interpretation: x-ray, labs and ECG   ED ECG REPORT   Date: 09/13/2017  Rate: 82  Rhythm: normal sinus rhythm  QRS Axis: left  Intervals: PR prolonged  ST/T Wave abnormalities: nonspecific ST/T changes   Conduction Disutrbances:first-degree A-V block  and right bundle branch block  Narrative Interpretation:   Old EKG Reviewed: unchanged; no significant changes from previous EKG dated 08/23/2017. I have personally reviewed the EKG tracing and agree with the computerized printout as noted.   Results for orders placed or performed during the hospital encounter of 09/13/17  CBC with Differential  Result Value Ref Range   WBC 13.3 (H) 4.0 - 10.5 K/uL   RBC 3.31 (L) 3.87 - 5.11 MIL/uL   Hemoglobin 10.0 (L) 12.0 - 15.0 g/dL   HCT 30.7 (L) 36.0 - 46.0 %   MCV 92.7 78.0 - 100.0 fL   MCH 30.2 26.0 - 34.0 pg   MCHC 32.6 30.0 - 36.0 g/dL   RDW 12.9 11.5 - 15.5 %   Platelets 336 150 - 400 K/uL  Neutrophils Relative % 81 %   Neutro Abs 10.7 (H) 1.7 - 7.7 K/uL   Lymphocytes Relative 12 %   Lymphs Abs 1.6 0.7 - 4.0 K/uL   Monocytes Relative 7 %   Monocytes Absolute 1.0 0.1 - 1.0 K/uL   Eosinophils Relative 0 %   Eosinophils Absolute 0.0 0.0 - 0.7 K/uL   Basophils Relative 0 %   Basophils Absolute 0.0 0.0 - 0.1 K/uL    Dg Hip Unilat With Pelvis 2-3 Views Right Result Date: 09/13/2017 CLINICAL DATA:  Right hip pain. EXAM: DG HIP (WITH OR WITHOUT PELVIS) 2-3V RIGHT COMPARISON:  09/01/2017 FINDINGS: Displaced right acetabular fracture again noted with protrusio. The degree of protrusio and fragment displacement has increased since prior study. Diffuse osteopenia. IMPRESSION: Displaced right acetabular fracture with worsening displacement and protrusio since prior study. Electronically Signed   By: Rolm Baptise M.D.   On: 09/13/2017 20:37    2045:  XR as above. T/C returned from Ortho Dr. Aline Brochure, case discussed, including:  HPI, pertinent PM/SHx, VS/PE, dx testing, ED course and treatment:  Not operative, non-weight bearing x6 weeks per his previous consult, apparently current facility cannot care for pt (assisted living) and needs upgrade to SNF and she was sent to the ED for admission to the hospital  to arrange SNF placement. Pt's family has now arrived and states same.   2210:   T/C returned from [facility] Dr. Manuella Ghazi, case discussed, including:  HPI, pertinent PM/SHx, VS/PE, dx testing, ED course and treatment:  Agreeable to admit.       Final Clinical Impressions(s) / ED Diagnoses   Final diagnoses:  None    ED Discharge Orders    None        Francine Graven, DO 09/17/17 1656

## 2017-09-13 NOTE — ED Triage Notes (Signed)
Pt is from [facility] facility in [place].  Pt had a right hip fx repaired 2 weeks ago.  Pt c/o increased pain to right hip tonight.  No apparent fall or injury tonight

## 2017-09-13 NOTE — ED Notes (Signed)
Roxanna Mew, [phone] with updates

## 2017-09-13 NOTE — H&P (Signed)
History and Physical    [name] BJY:[id number] DOB: September 07, 1929 DOA: 09/13/2017  PCP: Celene Squibb, MD   Patient coming from: ALF  Chief Complaint: R hip pain  HPI: [name] is a 84 y.o. female with medical history significant for Alzheimer's dementia, hypertension, dyslipidemia, normocytic anemia, and recent right acetabular and pubic ramus fracture (2 weeks ago) who was brought to the emergency department with complaints of some right-sided hip pain.  There was apparently no new fall or injury noted and the assisted living facility states that they cannot care for the patient any further and requests skilled nursing facility placement after discussion  She denies any current hip pain. No fever or chills. No cough, congestion, or rhinorrhea.   ED Course: Vital signs are stable and labs are essentially unremarkable when compared to prior.  X-ray of the right hip demonstrates the right acetabular fracture with some worsening displacement and chest x-ray has no acute findings aside from some mild atelectasis.  Findings were discussed with Dr. Aline Brochure of orthopedics who had evaluated her previously and states that she is not an operative candidate and will likely need skilled nursing facility/rehab placement with non-weight bearing for 6 weeks.  Family members were initially present and agree with the plan.  Review of Systems: Difficult to fully obtain due to dementia, but otherwise as above.  Past Medical History:  Diagnosis Date  . Alzheimer's disease 09/11/2014  . Coronary artery disease   . Dementia   . High cholesterol   . Hypertension     Past Surgical History:  Procedure Laterality Date  . CATARACT EXTRACTION W/PHACO  12/13/2011   Procedure: CATARACT EXTRACTION PHACO AND INTRAOCULAR LENS PLACEMENT (IOC);  Surgeon: [name], MD;  Location: [facility];  Service: Ophthalmology;  Laterality: Right;  CDE 12.67  . CATARACT EXTRACTION W/PHACO  03/06/2012   Procedure: CATARACT EXTRACTION PHACO AND INTRAOCULAR LENS PLACEMENT (IOC);  Surgeon: [name], MD;  Location: [facility];  Service: Ophthalmology;  Laterality: Left;  CDE:13.12  . CHOLECYSTECTOMY    . COLONOSCOPY  09/28/2011   Procedure: COLONOSCOPY;  Surgeon: [name], MD;  Location: [facility];  Service: Endoscopy;  Laterality: N/A;  1200     reports that she quit smoking about 10 years ago. She has a 2.50 pack-year smoking history. she has never used smokeless tobacco. She reports that she does not drink alcohol or use drugs.  No Known Allergies  Family History  Problem Relation Age of Onset  . Dementia Sister   . Dementia Brother     Prior to Admission medications   Medication Sig Start Date End Date Taking? Authorizing Provider  acetaminophen (TYLENOL) 325 MG tablet Take 325-650 mg by mouth every 6 (six) hours as needed.     [provider]  ALPRAZolam [name]) 0.5 MG tablet Take 0.5 mg by mouth 3 (three) times daily as needed for anxiety. For anxiety    [provider]  donepezil (ARICEPT) 10 MG tablet Take 1 tablet (10 mg total) by mouth at bedtime. 10/12/15   [name], NP  losartan (COZAAR) 100 MG tablet Take 100 mg by mouth daily.    [provider]  memantine (NAMENDA) 10 MG tablet Take 1 tablet (10 mg total) by mouth 2 (two) times daily. 10/12/15   [name], NP  Multiple Vitamin (MULTIVITAMIN WITH MINERALS) TABS tablet Take 1 tablet by mouth daily.    [provider]  potassium chloride SA (K-DUR,KLOR-CON) 20  MEQ tablet Take 20 mEq by mouth daily. Reported on 10/12/2015    [provider]  risperiDONE (RISPERDAL) 1 MG tablet Take 1 mg by mouth at bedtime.    [provider]  saccharomyces boulardii (FLORASTOR) 250 MG capsule Take 1 capsule (250 mg total) by mouth 2 (two) times daily. 05/06/17   Bonnielee Haff, MD  simvastatin (ZOCOR) 20 MG tablet Take 20 mg by mouth daily.    [provider]     Physical Exam: Vitals:   09/13/17 2008  BP: (!) 115/41  Pulse: 81  Resp: 17  Temp: 97.8 F (36.6 C)  TempSrc: Oral  SpO2: 94%    Constitutional: NAD, calm, comfortable Vitals:   09/13/17 2008  BP: (!) 115/41  Pulse: 81  Resp: 17  Temp: 97.8 F (36.6 C)  TempSrc: Oral  SpO2: 94%   Eyes: lids and conjunctivae normal ENMT: Mucous membranes are moist.  Neck: normal, supple Respiratory: clear to auscultation bilaterally. Normal respiratory effort. No accessory muscle use.  Cardiovascular: Regular rate and rhythm, no murmurs. No extremity edema. Abdomen: no tenderness, no distention. Bowel sounds positive.  Musculoskeletal:  No joint deformity upper and lower extremities.   Skin: no rashes, lesions, ulcers.   Labs on Admission: I have personally reviewed following labs and imaging studies  CBC: Recent Labs  Lab 09/13/17 2159  WBC 13.3*  NEUTROABS 10.7*  HGB 10.0*  HCT 30.7*  MCV 92.7  PLT 631   Basic Metabolic Panel: Recent Labs  Lab 09/13/17 2159  NA 135  K 4.8  CL 100*  CO2 24  GLUCOSE 125*  BUN 39*  CREATININE 0.92  CALCIUM 8.8*   GFR: Estimated Creatinine Clearance: 44 mL/min (by C-G formula based on SCr of 0.92 mg/dL). Liver Function Tests: No results for input(s): AST, ALT, ALKPHOS, BILITOT, PROT, ALBUMIN in the last 168 hours. No results for input(s): LIPASE, AMYLASE in the last 168 hours. No results for input(s): AMMONIA in the last 168 hours. Coagulation Profile: No results for input(s): INR, PROTIME in the last 168 hours. Cardiac Enzymes: Recent Labs  Lab 09/13/17 2159  TROPONINI <0.03   BNP (last 3 results) No results for input(s): PROBNP in the last 8760 hours. HbA1C: No results for input(s): HGBA1C in the last 72 hours. CBG: No results for input(s): GLUCAP in the last 168 hours. Lipid Profile: No results for input(s): CHOL, HDL, LDLCALC, TRIG, CHOLHDL, LDLDIRECT in the last 72 hours. Thyroid Function Tests: No results for  input(s): TSH, T4TOTAL, FREET4, T3FREE, THYROIDAB in the last 72 hours. Anemia Panel: No results for input(s): VITAMINB12, FOLATE, FERRITIN, TIBC, IRON, RETICCTPCT in the last 72 hours. Urine analysis:    Component Value Date/Time   COLORURINE STRAW (A) 08/23/2017 2144   APPEARANCEUR CLEAR 08/23/2017 2144   LABSPEC 1.002 (L) 08/23/2017 2144   PHURINE 6.0 08/23/2017 2144   GLUCOSEU NEGATIVE 08/23/2017 2144   HGBUR NEGATIVE 08/23/2017 2144   BILIRUBINUR NEGATIVE 08/23/2017 2144   [place] NEGATIVE 08/23/2017 2144   PROTEINUR NEGATIVE 08/23/2017 2144   UROBILINOGEN 0.2 10/07/2013 0406   NITRITE NEGATIVE 08/23/2017 2144   LEUKOCYTESUR NEGATIVE 08/23/2017 2144    Radiological Exams on Admission: Dg Chest 1 View  Result Date: 09/13/2017 CLINICAL DATA:  Initial evaluation for acute hip pain, acetabular fracture. EXAM: CHEST 1 VIEW COMPARISON:  Prior radiograph from 09/01/2016. FINDINGS: Mild cardiomegaly, stable. Mediastinal silhouette normal. Aortic atherosclerosis. Lungs hypoinflated. Streaky bibasilar subsegmental atelectasis. No focal infiltrates. No pulmonary edema or pleural effusion. No appreciable pneumothorax.  No acute osseus abnormality.  Diffuse osteopenia. IMPRESSION: 1. Shallow lung inflation with mild bibasilar subsegmental atelectasis. No other active cardiopulmonary disease. 2. Stable cardiomegaly with aortic atherosclerosis. Electronically Signed   By: Jeannine Boga M.D.   On: 09/13/2017 22:34   Dg Hip Unilat With Pelvis 2-3 Views Right  Result Date: 09/13/2017 CLINICAL DATA:  Right hip pain. EXAM: DG HIP (WITH OR WITHOUT PELVIS) 2-3V RIGHT COMPARISON:  09/01/2017 FINDINGS: Displaced right acetabular fracture again noted with protrusio. The degree of protrusio and fragment displacement has increased since prior study. Diffuse osteopenia. IMPRESSION: Displaced right acetabular fracture with worsening displacement and protrusio since prior study. Electronically Signed   By:  Rolm Baptise M.D.   On: 09/13/2017 20:37    EKG: Independently reviewed. NSR with 1st degree AV block.  Assessment/Plan Principal Problem:   Hip fracture (HCC) Active Problems:   Hypertension   High cholesterol   Alzheimer's disease   Anemia    1. Recent right acetabular and pubic rami fracture.  Orthopedics recommends nonweightbearing for 6 weeks and placement to skilled nursing facility/rehab after further PT/OT evaluation.  Will also consult clinical social worker to help assist with this process.  Placed on bedrest with fall precautions.  She denies any current pain, but will order Tylenol as needed for now.  Foley catheter placement. 2. Hypertension.  Well controlled at this time.  Continue current home medications. Cardiac diet as there are no plans for operative intervention. 3. Alzheimer's dementia.  Continue current medications. 4. Dyslipidemia.  Continue statin. 5. Normocytic anemia.  No overt bleeding noted and appears to be stable when compared to prior measurements.   DVT prophylaxis: Lovenox Code Status: DNR Family Communication: No family currently at bedside Disposition Plan:DC with rehab/SNF arrangements in AM after PT/OT eval Consults called:None Admission status: Obs, med-surg   Anmarie Fukushima Darleen Crocker DO [facility] Pager [phone]  If 7PM-7AM, please contact night-coverage [email or web address] Password [facility]  09/13/2017, 10:49 PM

## 2017-09-14 DIAGNOSIS — G309 Alzheimer's disease, unspecified: Secondary | ICD-10-CM | POA: Diagnosis not present

## 2017-09-14 DIAGNOSIS — F0280 Dementia in other diseases classified elsewhere without behavioral disturbance: Secondary | ICD-10-CM | POA: Diagnosis not present

## 2017-09-14 DIAGNOSIS — D649 Anemia, unspecified: Secondary | ICD-10-CM | POA: Diagnosis not present

## 2017-09-14 DIAGNOSIS — S72001D Fracture of unspecified part of neck of right femur, subsequent encounter for closed fracture with routine healing: Secondary | ICD-10-CM

## 2017-09-14 DIAGNOSIS — I10 Essential (primary) hypertension: Secondary | ICD-10-CM | POA: Diagnosis not present

## 2017-09-14 DIAGNOSIS — E7800 Pure hypercholesterolemia, unspecified: Secondary | ICD-10-CM | POA: Diagnosis not present

## 2017-09-14 LAB — BASIC METABOLIC PANEL
Anion gap: 10 (ref 5–15)
BUN: 35 mg/dL — ABNORMAL HIGH (ref 6–20)
CHLORIDE: 103 mmol/L (ref 101–111)
CO2: 24 mmol/L (ref 22–32)
CREATININE: 0.73 mg/dL (ref 0.44–1.00)
Calcium: 8.7 mg/dL — ABNORMAL LOW (ref 8.9–10.3)
GFR calc non Af Amer: >60 mL/min (ref >60)
Glucose, Bld: 112 mg/dL — ABNORMAL HIGH (ref 65–99)
Potassium: 4.4 mmol/L (ref 3.5–5.1)
SODIUM: 137 mmol/L (ref 135–145)

## 2017-09-14 LAB — GLUCOSE, CAPILLARY: GLUCOSE-CAPILLARY: 99 mg/dL (ref 65–99)

## 2017-09-14 LAB — CBC
HCT: 31.4 % — ABNORMAL LOW (ref 36.0–46.0)
HEMOGLOBIN: 9.9 g/dL — AB (ref 12.0–15.0)
MCH: 29.4 pg (ref 26.0–34.0)
MCHC: 31.5 g/dL (ref 30.0–36.0)
MCV: 93.2 fL (ref 78.0–100.0)
PLATELETS: 316 10*3/uL (ref 150–400)
RBC: 3.37 MIL/uL — AB (ref 3.87–5.11)
RDW: 12.5 % (ref 11.5–15.5)
WBC: 12.3 10*3/uL — AB (ref 4.0–10.5)

## 2017-09-14 MED ORDER — ALPRAZOLAM 0.5 MG PO TABS: 0.5000 mg | ORAL_TABLET | Freq: Three times a day (TID) | ORAL | 0 refills | Status: AC | PRN

## 2017-09-14 MED ORDER — POTASSIUM CHLORIDE CRYS ER 10 MEQ PO TBCR: 10.0000 meq | EXTENDED_RELEASE_TABLET | Freq: Every day | ORAL | Status: AC

## 2017-09-14 NOTE — Progress Notes (Signed)
[name] called and wants to talk to MD,  I informed him of near fall today with PT,  Texted Dr, Wynetta Emery with his phone number.  Patient sitting up in chair at this time.  Knows name and is not oriented to day or location.  Says thank you and answers simple questions.  Able to follow commands.

## 2017-09-14 NOTE — Clinical Social Work Note (Signed)
Clinical Social Work Assessment  Patient Details  Name: [name] MRN: [id number] Date of Birth: 03-28-1930  Date of referral:  09/14/17               Reason for consult:  Facility Placement                Permission sought to share information with:    Permission granted to share information::     Name::        Agency::     Relationship::     Contact Information:  [name], Son  Housing/Transportation Living arrangements for the past 2 months:  Assisted Living [facility] ) Source of Information:  Adult Children Patient Interpreter Needed:  None Criminal Activity/Legal Involvement Pertinent to Current Situation/Hospitalization:  No - Comment as needed Significant Relationships:  None Lives with:  Facility Resident Do you feel safe going back to the place where you live?  Yes Need for family participation in patient care:  No (Coment)  Care giving concerns:  [facility], patient's previous facility states that patient needs a higher level of care due to her non weight bearing status.    Social Worker assessment / plan:  Patient has been a resident at [facility] since 4/17. At baseline prior to her fall, she ambulated independently and fed herself. Staff assisted her with bathing. Patient's son is interested in [place] at SNF with the plan for her to return to [facility] upon completion of STR.   Employment status:  Retired Nurse, adult PT Recommendations:  [place] / Referral to community resources:  [place]  Patient/Family's Response to care:  Patient and family are agreeable to STR for SNF.   Patient/Family's Understanding of and Emotional Response to Diagnosis, Current Treatment, and Prognosis:  Patient and family understand patient's diagnosis, treatment, and prognosis.   Emotional Assessment Appearance:  Appears stated age Attitude/Demeanor/Rapport:    Affect (typically observed):   Accepting Orientation:  Oriented to Self, Oriented to Place Alcohol / Substance use:  Not Applicable Psych involvement (Current and /or in the community):  No (Comment)  Discharge Needs  Concerns to be addressed:  Discharge Planning Concerns Readmission within the last 30 days:  Yes Current discharge risk:  None Barriers to Discharge:  No Barriers Identified   Ihor Gully, LCSW 09/14/2017, 3:03 PM

## 2017-09-14 NOTE — Discharge Summary (Signed)
Physician Discharge Summary  [name] ZOX:[id number] DOB: 1929/12/02 DOA: 09/13/2017  PCP: Celene Squibb, MD Orthopedics: Dr. Ruthe Mannan MD  Admit date: 09/13/2017 Discharge date: 09/14/2017  Admitted From: Assisted living facility Disposition: Skilled nursing facility  Recommended Orders for [place]  1. Follow up with orthopedics Dr. Aline Brochure on 09/19/17 as scheduled.  2. Please obtain BMP/CBC in one week to monitor hemoglobin and potassium levels. Patient should remain strict nonweightbearing for 6 weeks until following up with orthopedist and having repeat imaging done.  Please be sure that patient follows up with Dr. Aline Brochure on 09/19/17 as scheduled. Fall precautions recommended.  Patient is very high risk for falling. Please check CBC and BMP in 1 week.  Discharge Condition: Stable medically CODE STATUS: DNR  Brief Hospitalization Summary: Please see all hospital notes, images, labs for full details of the hospitalization. HPI: [name] is a 84 y.o. female with medical history significant for Alzheimer's dementia, hypertension, dyslipidemia, normocytic anemia, and recent right acetabular and pubic ramus fracture (2 weeks ago) who was brought to the emergency department with complaints of some right-sided hip pain.  There was apparently no new fall or injury noted and the assisted living facility states that they cannot care for the patient any further and requests skilled nursing facility placement after discussion.  She denies any current hip pain.  No fever or chills.  No cough, congestion, or rhinorrhea.   ED Course: Vital signs are stable and labs are essentially unremarkable when compared to prior.  X-ray of the right hip demonstrates the right acetabular fracture with some worsening displacement and chest x-ray has no acute findings aside from some mild atelectasis.  Findings were discussed with Dr. Aline Brochure of orthopedics who had evaluated her  previously and states that she is not an operative candidate and will likely need skilled nursing facility/rehab placement with non-weight bearing for 6 weeks.  Family members were initially present and agree with the plan.  Brief Admission Hx: Virginia Gallego Blackwellis a 84 y.o.femalewith medical history significant forAlzheimer's dementia, hypertension, dyslipidemia, normocytic anemia, and recent right acetabular and pubic ramus fracture(2 weeks ago)who was brought to the emergency department with complaints of some right-sided hip pain.There was apparently no new fall or injury noted and the assisted living facility states that they cannot care for the patient any further and requests skilled nursing facility placement after discussion.  She denies any current hip pain.  MDM/Assessment & Plan:   1. Recent right acetabular and pubic rami fracture. Orthopedics recommends nonweightbearing for 6 weeks and placement to skilled nursing facility/rehab after further PT/OT evaluation recommending SNF.  The patient remains a high fall risk.  I recommend strict fall precautions.  The patient has an appointment to follow-up with orthopedics Dr. Aline Brochure on 09/19/17.  I strongly encourage patient to follow-up for that appointment where she likely will have repeat imaging done.  Consulted clinical social worker to help assist with this process. Arrangements have been made for patient to go to SNF.  Tylenol as needed for pain. Foley catheter placement to protect skin. 2. Hypertension. Well controlled at this time. Continue current home medications. Cardiac diet as there are no plans for operative intervention. 3. Alzheimer's dementia. Continue current medications. 4. Dyslipidemia. Continue statin. 5. Normocytic anemia. No overt bleeding noted and appears to be stable when compared to prior measurements. 6. Leukocytosis - suspect reactive as have not found any signs or symptoms of infection.  Repeat CBC  recommended within 1  week to follow up.    DVT prophylaxis:Lovenox Code Status:DNR Family Communication:No familycurrentlyat bedside Disposition Plan:SNF   Discharge Diagnoses:  Principal Problem:   Hip fracture ([place]) Active Problems:   Hypertension   High cholesterol   Alzheimer's disease   Anemia  Discharge Instructions: Discharge Instructions    Discharge instructions   Complete by:  As directed    Pt nonweightbearing for at least 6 weeks until she follows up with orthopedics.   Please have her follow up with orthopedics in 2 weeks for repeat imaging and follow up of fracture.     Allergies as of 09/14/2017   No Known Allergies     Medication List    TAKE these medications   acetaminophen 325 MG tablet Commonly known as:  TYLENOL Take 325-650 mg by mouth every 6 (six) hours as needed.   ALPRAZolam 0.5 MG tablet Commonly known as:  XANAX Take 1 tablet (0.5 mg total) by mouth 3 (three) times daily as needed for anxiety. For anxiety   diclofenac sodium 1 % Gel Commonly known as:  VOLTAREN Apply topically 4 (four) times daily.   donepezil 10 MG tablet Commonly known as:  ARICEPT Take 1 tablet (10 mg total) by mouth at bedtime.   losartan 100 MG tablet Commonly known as:  COZAAR Take 100 mg by mouth daily.   memantine 10 MG tablet Commonly known as:  NAMENDA Take 1 tablet (10 mg total) by mouth 2 (two) times daily.   multivitamin with minerals Tabs tablet Take 1 tablet by mouth daily.   potassium chloride 10 MEQ tablet Commonly known as:  K-DUR,KLOR-CON Take 1 tablet (10 mEq total) by mouth daily. Reported on 10/12/2015 What changed:    medication strength  how much to take   risperiDONE 1 MG tablet Commonly known as:  RISPERDAL Take 1 mg by mouth at bedtime.   simvastatin 20 MG tablet Commonly known as:  ZOCOR Take 20 mg by mouth daily.      Follow-up Information    Carole Civil, MD. Go on 09/19/2017.   Specialties:  Orthopedic  Surgery, Radiology Why:  as already scheduled orthopedic follow up appointment Contact information: [address] [place] [address] [phone]          No Known Allergies Allergies as of 09/14/2017   No Known Allergies     Medication List    TAKE these medications   acetaminophen 325 MG tablet Commonly known as:  TYLENOL Take 325-650 mg by mouth every 6 (six) hours as needed.   ALPRAZolam 0.5 MG tablet Commonly known as:  XANAX Take 1 tablet (0.5 mg total) by mouth 3 (three) times daily as needed for anxiety. For anxiety   diclofenac sodium 1 % Gel Commonly known as:  VOLTAREN Apply topically 4 (four) times daily.   donepezil 10 MG tablet Commonly known as:  ARICEPT Take 1 tablet (10 mg total) by mouth at bedtime.   losartan 100 MG tablet Commonly known as:  COZAAR Take 100 mg by mouth daily.   memantine 10 MG tablet Commonly known as:  NAMENDA Take 1 tablet (10 mg total) by mouth 2 (two) times daily.   multivitamin with minerals Tabs tablet Take 1 tablet by mouth daily.   potassium chloride 10 MEQ tablet Commonly known as:  K-DUR,KLOR-CON Take 1 tablet (10 mEq total) by mouth daily. Reported on 10/12/2015 What changed:    medication strength  how much to take   risperiDONE 1 MG tablet Commonly  known as:  RISPERDAL Take 1 mg by mouth at bedtime.   simvastatin 20 MG tablet Commonly known as:  ZOCOR Take 20 mg by mouth daily.       Procedures/Studies: Dg Chest 1 View  Result Date: 09/13/2017 CLINICAL DATA:  Initial evaluation for acute hip pain, acetabular fracture. EXAM: CHEST 1 VIEW COMPARISON:  Prior radiograph from 09/01/2016. FINDINGS: Mild cardiomegaly, stable. Mediastinal silhouette normal. Aortic atherosclerosis. Lungs hypoinflated. Streaky bibasilar subsegmental atelectasis. No focal infiltrates. No pulmonary edema or pleural effusion. No appreciable pneumothorax. No acute osseus abnormality.  Diffuse osteopenia. IMPRESSION: 1.  Shallow lung inflation with mild bibasilar subsegmental atelectasis. No other active cardiopulmonary disease. 2. Stable cardiomegaly with aortic atherosclerosis. Electronically Signed   By: Jeannine Boga M.D.   On: 09/13/2017 22:34   Dg Chest 1 View  Result Date: 09/01/2017 CLINICAL DATA:  Fall. EXAM: CHEST 1 VIEW COMPARISON:  Chest x-rays since June 19, 2016 FINDINGS: The heart size is borderline and stable. There is a torturous thoracic aorta which is stable. No pneumothorax. No nodules, masses, or infiltrates. No other acute abnormalities. IMPRESSION: No active disease. Electronically Signed   By: Dorise Bullion III M.D   On: 09/01/2017 18:12   Ct Hip Right Wo Contrast  Result Date: 09/01/2017 CLINICAL DATA:  Patient fell out of bed last evening and presents with right hip pain. EXAM: CT OF THE RIGHT HIP WITHOUT CONTRAST TECHNIQUE: Multidetector CT imaging of the right hip was performed according to the standard protocol. Multiplanar CT image reconstructions were also generated. COMPARISON:  09/01/2017 radiographs of the pelvis and right hip. FINDINGS: Bones/Joint/Cartilage There is an acute comminuted fracture of the roof of the right acetabulum with fractures involving anterior and posterior columns of the acetabulum, superior pubic ramus at the junction with the acetabulum as well as a comminuted angulated and displaced fracture of the right inferior pubic ramus. There is slight medial displacement of the uppermost medial aspect of the roof. Slight protrusio of the right femoral head. The left hip and pubic rami appear intact. No pelvic diastasis. Vertebral body hemangioma of L4. Multilevel degenerative facet arthropathy of the included lower lumbar spine. Intact sacrum and coccyx. No proximal femoral fracture. Ligaments Suboptimally assessed by CT. Muscles and Tendons Posttraumatic swelling of right obturator internus muscle and iliacus. No active hemorrhage noted. No significant joint  effusion. Soft tissues Soft tissue contusion overlying lateral aspect of the right hip. No abnormal fluid collection or hematoma. IMPRESSION: 1. Right femoral head protrusio secondary to a comminuted anterior and posterior column fractures of the acetabulum with fracture extending to the iliac wing to involve the anterior column and faintly visualized fracture extending to the sciatic notch suggesting posterior column involvement. Right superior and inferior pubic rami fractures as above described. 2. No proximal femoral fracture. 3. Vertebral hemangioma of L4. Lower lumbar facet arthropathy. No acute lumbar spine fracture. Electronically Signed   By: Ashley Royalty M.D.   On: 09/01/2017 22:16   Ct 3d Recon At Scanner  Result Date: 09/01/2017 CLINICAL DATA:  Nonspecific (abnormal) findings on radiological and other examination of musculoskeletal system. EXAM: 3-DIMENSIONAL CT IMAGE RENDERING ON ACQUISITION WORKSTATION TECHNIQUE: 3-dimensional CT images were rendered by post-processing of the original CT data on an acquisition workstation for known right hip fracture. The 3-dimensional CT images were interpreted and findings were reported in the accompanying complete CT report for this study COMPARISON:  None. FINDINGS: 3D imaging demonstrate protrusio deformity of the right hip secondary to acetabular fractures involving the  anterior and posterior column. IMPRESSION: 3D imaging rendered for surgical planning of comminuted right acetabular fracture with protrusio deformity. Electronically Signed   By: Ashley Royalty M.D.   On: 09/01/2017 23:13   Dg Chest Port 1 View  Result Date: 08/23/2017 CLINICAL DATA:  Generalized weakness EXAM: PORTABLE CHEST 1 VIEW COMPARISON:  05/04/2017 FINDINGS: Coarse chronic interstitial opacity. No acute consolidation or effusion. Stable mild cardiomegaly with aortic atherosclerosis. No pneumothorax. IMPRESSION: No active disease. Stable mild cardiomegaly and coarse chronic appearing  interstitial opacity. Electronically Signed   By: Donavan Foil M.D.   On: 08/23/2017 23:52   Dg Hip Unilat With Pelvis 2-3 Views Right  Result Date: 09/13/2017 CLINICAL DATA:  Right hip pain. EXAM: DG HIP (WITH OR WITHOUT PELVIS) 2-3V RIGHT COMPARISON:  09/01/2017 FINDINGS: Displaced right acetabular fracture again noted with protrusio. The degree of protrusio and fragment displacement has increased since prior study. Diffuse osteopenia. IMPRESSION: Displaced right acetabular fracture with worsening displacement and protrusio since prior study. Electronically Signed   By: Rolm Baptise M.D.   On: 09/13/2017 20:37   Dg Hip Unilat W Or Wo Pelvis 2-3 Views Right  Result Date: 09/01/2017 CLINICAL DATA:  [place] out of bed last night. Right leg pain. Initial encounter. EXAM: DG HIP (WITH OR WITHOUT PELVIS) 2-3V RIGHT COMPARISON:  Abdominal radiographs 06/19/2016. Pelvic and left hip radiographs 02/26/2013. FINDINGS: The bones appear diffusely osteopenic. Cross table lateral radiographs are limited by patient body habitus and underpenetration. There is a right acetabular fracture with medial displacement of a dominant fracture fragment. The right femoral head is also medially positioned through the region of the fracture without gross dislocation. There are right inferior and possibly superior pubic rami fractures. IMPRESSION: 1. Displaced right acetabular fracture. 2. Right inferior and possible superior pubic rami fractures. Electronically Signed   By: Logan Bores M.D.   On: 09/01/2017 18:14   Dg Femur Min 2 Views Right  Result Date: 09/01/2017 CLINICAL DATA:  84 year old female status post fall out of bed last night with right leg pain. Right acetabular and pubic rami fractures seen on earlier pelvis and right hip series. EXAM: RIGHT FEMUR 2 VIEWS COMPARISON:  Pelvis and right hip series 17 20 hr today. FINDINGS: Osteopenia. Displaced right acetabular fracture and or mildly displaced right pubic rami  fractures are redemonstrated. Stable right femoral head. The proximal right femur including the intertrochanteric segment appears stable and intact. The right femoral shaft is intact. The distal right femur appears intact. Preserved alignment at the right knee. Calcified peripheral vascular disease in the right lower extremity. IMPRESSION: 1. Right acetabular and pubic rami fractures redemonstrated. 2. No right femur fracture identified. Electronically Signed   By: Genevie Ann M.D.   On: 09/01/2017 20:13      Subjective: Patient is without complaints at this time.  She has remained stable in the hospital.  Discharge Exam: Vitals:   09/14/17 0904 09/14/17 1444  BP: (!) 126/41 (!) 131/57  Pulse: 85 99  Resp: 18 18  Temp: 97.8 F (36.6 C) 98.7 F (37.1 C)  SpO2: 100% 100%   Vitals:   09/13/17 2008 09/14/17 0600 09/14/17 0904 09/14/17 1444  BP: (!) 115/41 (!) 118/48 (!) 126/41 (!) 131/57  Pulse: 81 78 85 99  Resp: 17 18 18 18   Temp: 97.8 F (36.6 C) (!) 97.2 F (36.2 C) 97.8 F (36.6 C) 98.7 F (37.1 C)  TempSrc: Oral Oral Oral Oral  SpO2: 94% 95% 100% 100%    General  exam: elderly female, chronically ill appearing.   Respiratory system: BBS shallow but clear.  No increased work of breathing. Cardiovascular system: S1 & S2 heard, RRR. No JVD, murmurs, gallops, clicks or pedal edema. Gastrointestinal system: Abdomen is nondistended, soft and nontender. Normal bowel sounds heard. Central nervous system: Alert and oriented. No focal neurological deficits. Extremities: good peripheral pulses bilateral.   The results of significant diagnostics from this hospitalization (including imaging, microbiology, ancillary and laboratory) are listed below for reference.     Microbiology: No results found for this or any previous visit (from the past 240 hour(s)).   Labs: BNP (last 3 results) Recent Labs    05/04/17 1805  BNP 72.5   Basic Metabolic Panel: Recent Labs  Lab 09/13/17 2159  09/14/17 0527  NA 135 137  K 4.8 4.4  CL 100* 103  CO2 24 24  GLUCOSE 125* 112*  BUN 39* 35*  CREATININE 0.92 0.73  CALCIUM 8.8* 8.7*   Liver Function Tests: No results for input(s): AST, ALT, ALKPHOS, BILITOT, PROT, ALBUMIN in the last 168 hours. No results for input(s): LIPASE, AMYLASE in the last 168 hours. No results for input(s): AMMONIA in the last 168 hours. CBC: Recent Labs  Lab 09/13/17 2159 09/14/17 0527  WBC 13.3* 12.3*  NEUTROABS 10.7*  --   HGB 10.0* 9.9*  HCT 30.7* 31.4*  MCV 92.7 93.2  PLT 336 316   Cardiac Enzymes: Recent Labs  Lab 09/13/17 2159  TROPONINI <0.03   BNP: Invalid input(s): POCBNP CBG: Recent Labs  Lab 09/14/17 0814  GLUCAP 99   D-Dimer No results for input(s): DDIMER in the last 72 hours. Hgb A1c No results for input(s): HGBA1C in the last 72 hours. Lipid Profile No results for input(s): CHOL, HDL, LDLCALC, TRIG, CHOLHDL, LDLDIRECT in the last 72 hours. Thyroid function studies No results for input(s): TSH, T4TOTAL, T3FREE, THYROIDAB in the last 72 hours.  Invalid input(s): FREET3 Anemia work up No results for input(s): VITAMINB12, FOLATE, FERRITIN, TIBC, IRON, RETICCTPCT in the last 72 hours. Urinalysis    Component Value Date/Time   COLORURINE STRAW (A) 08/23/2017 2144   APPEARANCEUR CLEAR 08/23/2017 2144   LABSPEC 1.002 (L) 08/23/2017 2144   PHURINE 6.0 08/23/2017 2144   GLUCOSEU NEGATIVE 08/23/2017 2144   HGBUR NEGATIVE 08/23/2017 2144   BILIRUBINUR NEGATIVE 08/23/2017 2144   [place] NEGATIVE 08/23/2017 2144   PROTEINUR NEGATIVE 08/23/2017 2144   UROBILINOGEN 0.2 10/07/2013 0406   NITRITE NEGATIVE 08/23/2017 2144   LEUKOCYTESUR NEGATIVE 08/23/2017 2144   Sepsis Labs Invalid input(s): PROCALCITONIN,  WBC,  LACTICIDVEN Microbiology No results found for this or any previous visit (from the past 240 hour(s)).  Time coordinating discharge:   SIGNED:  Irwin Brakeman, MD  [facility] 09/14/2017, 4:22  PM Pager [phone]  If 7PM-7AM, please contact night-coverage [email or web address] Password [email or web address]

## 2017-09-14 NOTE — Clinical Social Work Placement (Signed)
   CLINICAL SOCIAL WORK PLACEMENT  NOTE  Date:  09/14/2017  Patient Details  Name: [name] MRN: [id number] Date of Birth: 1929-10-14  Clinical Social Work is seeking post-discharge placement for this patient at the [place] level of care (*CSW will initial, date and re-position this form in  chart as items are completed):  Yes   Patient/family provided with [place] Work Department's list of facilities offering this level of care within the geographic area requested by the patient (or if unable, by the patient's family).  Yes   Patient/family informed of their freedom to choose among providers that offer the needed level of care, that participate in Medicare, Medicaid or managed care program needed by the patient, have an available bed and are willing to accept the patient.  Yes   Patient/family informed of [facility]'s ownership interest in [facility] and [facility], as well as of the fact that they are under no obligation to receive care at these facilities.  PASRR submitted to EDS on 09/14/17     PASRR number received on 09/14/17     Existing PASRR number confirmed on       FL2 transmitted to all facilities in geographic area requested by pt/family on 09/14/17     FL2 transmitted to all facilities within larger geographic area on 09/14/17     Patient informed that his/her managed care company has contracts with or will negotiate with certain facilities, including the following:        Yes   Patient/family informed of bed offers received.  Patient chooses bed at [facility]     Physician recommends and patient chooses bed at      Patient to be transferred to [facility] on 09/14/17.  Patient to be transferred to facility by [facility]     Patient family notified on 09/14/17 of transfer.  Name of family member notified:  Roxanna Mew, son     PHYSICIAN       Additional Comment:  Facility notified and  discharge clinicals sent. LOG emailed to AD. LOG discussed with son as well as details on authorization being in pending status and needing to be approved.   _______________________________________________ Ambrose Pancoast D, LCSW 09/14/2017, 4:47 PM

## 2017-09-14 NOTE — Progress Notes (Signed)
Report called to [facility] in [place].  Patient transported by EMS

## 2017-09-14 NOTE — NC FL2 (Deleted)
[place] MEDICAID FL2 LEVEL OF CARE SCREENING TOOL     IDENTIFICATION  Patient Name: [name] Birthdate: 1929/11/24 Sex: female Admission Date (Current Location): 09/13/2017  [facility] and [place] Number:  [employer] and Address:  [facility] [address] [place]      Provider Number: [id number]  Attending Physician Name and Address:  Murlean Iba, MD  Relative Name and Phone Number:       Current Level of Care:   Recommended Level of Care:   Prior Approval Number:    Date Approved/Denied:   PASRR Number: [id number]  Discharge Plan: SNF    Current Diagnoses: Patient Active Problem List   Diagnosis Date Noted  . Hip fracture ([place]) 09/13/2017  . Anemia 09/13/2017  . Right facial swelling   . Hypotension 04/25/2016  . Diarrhea 04/25/2016  . Acute renal failure ([place]) 04/25/2016  . Acute renal failure (ARF) ([place]) 04/25/2016  . Alzheimer's disease 09/11/2014  . Chest pain 05/24/2012  . Hypertension 08/30/2011  . High cholesterol 08/30/2011    Orientation RESPIRATION BLADDER Height & Weight     Place, Self  Normal Continent Weight:   Height:     BEHAVIORAL SYMPTOMS/MOOD NEUROLOGICAL BOWEL NUTRITION STATUS      Continent (Heart Healthy)  AMBULATORY STATUS COMMUNICATION OF NEEDS Skin   Total Care(Rt. hip fracture. patient should be nonweightbearing for 6 weeks ) Verbally Normal                       Personal Care Assistance Level of Assistance  Bathing, Feeding, Dressing Bathing Assistance: Limited assistance Feeding assistance: Independent Dressing Assistance: Limited assistance     Functional Limitations Info  Sight, Hearing, Speech Sight Info: Adequate Hearing Info: Adequate Speech Info: Adequate    SPECIAL CARE FACTORS FREQUENCY  PT (By licensed PT)     PT Frequency: 5x/week               Contractures      Additional Factors Info  Code Status, Allergies, Psychotropic Code  Status Info: DNR Allergies Info: KNA Psychotropic Info: Xanax, Risperdal.          Current Medications (09/14/2017):  This is the current hospital active medication list Current Facility-Administered Medications  Medication Dose Route Frequency Provider Last Rate Last Dose  . 0.9 %  sodium chloride infusion  250 mL Intravenous PRN Manuella Ghazi, Pratik D, DO      . acetaminophen (TYLENOL) tablet 325-650 mg  325-650 mg Oral Q6H PRN Manuella Ghazi, Pratik D, DO      . ALPRAZolam [name]) tablet 0.5 mg  0.5 mg Oral TID PRN Manuella Ghazi, Pratik D, DO      . donepezil (ARICEPT) tablet 10 mg  10 mg Oral QHS Shah, Pratik D, DO   10 mg at 09/14/17 0121  . enoxaparin (LOVENOX) injection 40 mg  40 mg Subcutaneous Q24H Manuella Ghazi, Pratik D, DO   40 mg at 09/14/17 0122  . losartan (COZAAR) tablet 100 mg  100 mg Oral Daily Manuella Ghazi, Pratik D, DO   100 mg at 09/14/17 1052  . memantine (NAMENDA) tablet 10 mg  10 mg Oral BID Manuella Ghazi, Pratik D, DO   10 mg at 09/14/17 1052  . multivitamin with minerals tablet 1 tablet  1 tablet Oral Daily Manuella Ghazi, Pratik D, DO   1 tablet at 09/14/17 1053  . ondansetron (ZOFRAN) tablet 4 mg  4 mg Oral Q6H PRN Manuella Ghazi, Pratik D, DO  Or  . ondansetron (ZOFRAN) injection 4 mg  4 mg Intravenous Q6H PRN Manuella Ghazi, Pratik D, DO      . potassium chloride SA (K-DUR,KLOR-CON) CR tablet 20 mEq  20 mEq Oral Daily Manuella Ghazi, Pratik D, DO   20 mEq at 09/14/17 1052  . risperiDONE (RISPERDAL) tablet 1 mg  1 mg Oral QHS Shah, Pratik D, DO   1 mg at 09/14/17 0122  . saccharomyces boulardii (FLORASTOR) capsule 250 mg  250 mg Oral BID Manuella Ghazi, Pratik D, DO   250 mg at 09/14/17 1058  . simvastatin (ZOCOR) tablet 20 mg  20 mg Oral Daily Manuella Ghazi, Pratik D, DO   20 mg at 09/14/17 1053  . sodium chloride flush (NS) 0.9 % injection 3 mL  3 mL Intravenous Q12H Shah, Pratik D, DO   3 mL at 09/14/17 1058  . sodium chloride flush (NS) 0.9 % injection 3 mL  3 mL Intravenous PRN Heath Lark D, DO         Discharge Medications: Please see discharge summary  for a list of discharge medications.  Relevant Imaging Results:  Relevant Lab Results:   Additional Information    Brightyn Mozer, Clydene Pugh, LCSW

## 2017-09-14 NOTE — Evaluation (Signed)
Occupational Therapy Evaluation Patient Details Name: [name] MRN: [id number] DOB: 01-Jan-1930 Today's Date: 09/14/2017    History of Present Illness [name] is a 84 y.o. female with medical history significant for Alzheimer's dementia, hypertension, dyslipidemia, normocytic anemia, and recent right acetabular and pubic ramus fracture (2 weeks ago) who was brought to the emergency department with complaints of some right-sided hip pain.  There was apparently no new fall or injury noted and the assisted living facility states that they cannot care for the patient any further and requests skilled nursing facility placement after discussion    Clinical Impression   Pt received supine in bed, agreeable to OT/PT evaluations. Pt cognitive deficits limiting PLOF gleaned, pt from ALF. During evaluation pt requiring assistance to doff pajama shirt and donn gown, able to assist OT minimally. Total assistance for LB dressing. During evaluation pt seated at EOB with OT behind pt and PT in front of pt. Pt leaned forward and slid to floor, did not hit head, requiring +2 total assist to move to chair. Nursing notified and safety zone portal completed by OT. Pt reports no pain once seated in chair. Recommend SNF on discharge to improve safety and independence in ADLs.     Follow Up Recommendations  SNF    Equipment Recommendations  None recommended by OT       Precautions / Restrictions Precautions Precautions: Fall Restrictions Weight Bearing Restrictions: Yes RLE Weight Bearing: Non weight bearing      Mobility Bed Mobility Overal bed mobility: Needs Assistance Bed Mobility: Supine to Sit Supine to sit: Max assist  Transfers Overall transfer level: Needs assistance Equipment used: 2 person hand held assist Transfers: Sit to/from [employer] Transfers Sit to Stand: Max assist;Total assist Stand pivot transfers: Max assist;Total assist General transfer comment:  Patient unable to maintain NWB RLE when attempting to transfer with a RW          ADL either performed or assessed with clinical judgement   ADL Overall ADL's : Needs assistance/impaired                 Upper Body Dressing : Maximal assistance;Bed level Upper Body Dressing Details (indicate cue type and reason): Pt able to assist with threading arms through gown. Unable to doff pajama top  Lower Body Dressing: Total assistance;Bed level                 General ADL Comments: Pt seemingly weak and requiring consistent verbal cuing for assisting with ADLs     Vision Baseline Vision/History: (unknown) Patient Visual Report: No change from baseline Vision Assessment?: No apparent visual deficits      Hand Dominance (unknown)   Extremity/Trunk Assessment Upper Extremity Assessment Upper Extremity Assessment: Generalized weakness   Lower Extremity Assessment Lower Extremity Assessment: Defer to PT evaluation       Communication Communication Communication: No difficulties   Cognition Arousal/Alertness: Awake/alert Behavior During Therapy: WFL for tasks assessed/performed Overall Cognitive Status: History of cognitive impairments - at baseline                                 General Comments: No family/caregiver available to determine baseline cognition              Home Living Family/patient expects to be discharged to:: Skilled nursing facility  Additional Comments: Pt is from ALF whom cannot provide with necessary level of care      Prior Functioning/Environment Level of Independence: Needs assistance  Gait / Transfers Assistance Needed: Unknown-pt reports she used facilities equipment occasionally  ADL's / Homemaking Assistance Needed: unknown-pt unable to provide history. Suspect pt required some assistance at baseline            OT Problem List: Decreased strength;Decreased  activity tolerance;Impaired balance (sitting and/or standing);Decreased safety awareness;Decreased knowledge of use of DME or AE;Pain      OT Treatment/Interventions: Self-care/ADL training;Therapeutic exercise;DME and/or AE instruction;Therapeutic activities;Patient/family education    OT Goals(Current goals can be found in the care plan section) Acute Rehab OT Goals Patient Stated Goal: none stated OT Goal Formulation: Patient unable to participate in goal setting Time For Goal Achievement: 09/28/17 Potential to Achieve Goals: Good  OT Frequency: Min 2X/week           Co-evaluation PT/OT/SLP Co-Evaluation/Treatment: Yes Reason for Co-Treatment: Complexity of the patient's impairments (multi-system involvement);For patient/therapist safety;To address functional/ADL transfers   OT goals addressed during session: ADL's and self-care;Other (comment)(functional transfers)      AM-PAC PT "6 Clicks" Daily Activity     Outcome Measure Help from another person eating meals?: A Lot Help from another person taking care of personal grooming?: A Lot Help from another person toileting, which includes using toliet, bedpan, or urinal?: A Lot Help from another person bathing (including washing, rinsing, drying)?: Total Help from another person to put on and taking off regular upper body clothing?: A Lot Help from another person to put on and taking off regular lower body clothing?: Total 6 Click Score: 10   End of Session Nurse Communication: Mobility status  Activity Tolerance: Patient tolerated treatment well Patient left: in chair;with call bell/phone within reach  OT Visit Diagnosis: Muscle weakness (generalized) (M62.81)                Time: 2878-6767 OT Time Calculation (min): 26 min Charges:  OT General Charges $OT Visit: 1 Visit OT Evaluation $OT Eval Moderate Complexity: [place], OTR/L  [phone] 09/14/2017, 9:24 AM

## 2017-09-14 NOTE — Plan of Care (Signed)
  No Outcome Acute Rehab OT Goals (only OT should resolve) Pt. Will Perform Grooming 09/14/2017 1223 by Chryl Heck, OT Flowsheets Taken 09/14/2017 1223  Pt Will Perform Grooming with supervision;with min guard assist;sitting Pt. Will Perform Upper Body Dressing 09/14/2017 1223 by Chryl Heck, OT Flowsheets Taken 09/14/2017 1223  Pt Will Perform Upper Body Dressing with min assist;sitting Pt. Will Transfer To Toilet 09/14/2017 1223 by Chryl Heck, OT Flowsheets Taken 09/14/2017 1223  Pt Will Transfer to Toilet with mod assist;stand pivot transfer;bedside commode Note Maintaining NWB status on RLE Pt. Will Perform Toileting-Clothing Manipulation 09/14/2017 1223 by Chryl Heck, OT Flowsheets Taken 09/14/2017 1223  Pt Will Perform Toileting - Clothing Manipulation and hygiene with min assist;sitting/lateral leans

## 2017-09-14 NOTE — Progress Notes (Signed)
PROGRESS NOTE  [name]  KXF:[id number]  DOB: 1930/03/14  DOA: 09/13/2017 PCP: Celene Squibb, MD   Brief Admission Hx: [name] is a 84 y.o. female with medical history significant for Alzheimer's dementia, hypertension, dyslipidemia, normocytic anemia, and recent right acetabular and pubic ramus fracture (2 weeks ago) who was brought to the emergency department with complaints of some right-sided hip pain.  There was apparently no new fall or injury noted and the assisted living facility states that they cannot care for the patient any further and requests skilled nursing facility placement after discussion.  She denies any current hip pain.  MDM/Assessment & Plan:   1. Recent right acetabular and pubic rami fracture.  Orthopedics recommends nonweightbearing for 6 weeks and placement to skilled nursing facility/rehab after further PT/OT evaluation.  Consulted clinical social worker to help assist with this process.  Placed on bedrest with fall precautions.  She denies any current pain, but will order Tylenol as needed for now.  Foley catheter placement to protect skin. 2. Hypertension.  Well controlled at this time.  Continue current home medications. Cardiac diet as there are no plans for operative intervention. 3. Alzheimer's dementia.  Continue current medications. 4. Dyslipidemia.  Continue statin. 5. Normocytic anemia.  No overt bleeding noted and appears to be stable when compared to prior measurements.  DVT prophylaxis: Lovenox Code Status: DNR Family Communication: No family currently at bedside Disposition Plan:SNF when bed available Consults called:None  Subjective: Pt without complaints, says that her pain is controlled.  She had a fall earlier with PT but she is doing fine now.  She denies complaints.   Objective: Vitals:   09/13/17 2008 09/14/17 0600 09/14/17 0904  BP: (!) 115/41 (!) 118/48 (!) 126/41  Pulse: 81 78 85  Resp: 17 18 18   Temp: 97.8 F  (36.6 C) (!) 97.2 F (36.2 C) 97.8 F (36.6 C)  TempSrc: Oral Oral Oral  SpO2: 94% 95% 100%   No intake or output data in the 24 hours ending 09/14/17 1440 There were no vitals filed for this visit.  REVIEW OF SYSTEMS  As per history otherwise all reviewed and reported negative  Exam:  General exam: elderly female, chronically ill appearing.   Respiratory system: BBS shallow but clear.  No increased work of breathing. Cardiovascular system: S1 & S2 heard, RRR. No JVD, murmurs, gallops, clicks or pedal edema. Gastrointestinal system: Abdomen is nondistended, soft and nontender. Normal bowel sounds heard. Central nervous system: Alert and oriented. No focal neurological deficits. Extremities: good peripheral pulses bilateral.   Data Reviewed: Basic Metabolic Panel: Recent Labs  Lab 09/13/17 2159 09/14/17 0527  NA 135 137  K 4.8 4.4  CL 100* 103  CO2 24 24  GLUCOSE 125* 112*  BUN 39* 35*  CREATININE 0.92 0.73  CALCIUM 8.8* 8.7*   Liver Function Tests: No results for input(s): AST, ALT, ALKPHOS, BILITOT, PROT, ALBUMIN in the last 168 hours. No results for input(s): LIPASE, AMYLASE in the last 168 hours. No results for input(s): AMMONIA in the last 168 hours. CBC: Recent Labs  Lab 09/13/17 2159 09/14/17 0527  WBC 13.3* 12.3*  NEUTROABS 10.7*  --   HGB 10.0* 9.9*  HCT 30.7* 31.4*  MCV 92.7 93.2  PLT 336 316   Cardiac Enzymes: Recent Labs  Lab 09/13/17 2159  TROPONINI <0.03   CBG (last 3)  Recent Labs    09/14/17 0814  GLUCAP 99   No results found for this or any  previous visit (from the past 240 hour(s)).   Studies: Dg Chest 1 View  Result Date: 09/13/2017 CLINICAL DATA:  Initial evaluation for acute hip pain, acetabular fracture. EXAM: CHEST 1 VIEW COMPARISON:  Prior radiograph from 09/01/2016. FINDINGS: Mild cardiomegaly, stable. Mediastinal silhouette normal. Aortic atherosclerosis. Lungs hypoinflated. Streaky bibasilar subsegmental atelectasis. No  focal infiltrates. No pulmonary edema or pleural effusion. No appreciable pneumothorax. No acute osseus abnormality.  Diffuse osteopenia. IMPRESSION: 1. Shallow lung inflation with mild bibasilar subsegmental atelectasis. No other active cardiopulmonary disease. 2. Stable cardiomegaly with aortic atherosclerosis. Electronically Signed   By: Jeannine Boga M.D.   On: 09/13/2017 22:34   Dg Hip Unilat With Pelvis 2-3 Views Right  Result Date: 09/13/2017 CLINICAL DATA:  Right hip pain. EXAM: DG HIP (WITH OR WITHOUT PELVIS) 2-3V RIGHT COMPARISON:  09/01/2017 FINDINGS: Displaced right acetabular fracture again noted with protrusio. The degree of protrusio and fragment displacement has increased since prior study. Diffuse osteopenia. IMPRESSION: Displaced right acetabular fracture with worsening displacement and protrusio since prior study. Electronically Signed   By: Rolm Baptise M.D.   On: 09/13/2017 20:37   Scheduled Meds: . donepezil  10 mg Oral QHS  . enoxaparin (LOVENOX) injection  40 mg Subcutaneous Q24H  . losartan  100 mg Oral Daily  . memantine  10 mg Oral BID  . multivitamin with minerals  1 tablet Oral Daily  . potassium chloride SA  20 mEq Oral Daily  . risperiDONE  1 mg Oral QHS  . saccharomyces boulardii  250 mg Oral BID  . simvastatin  20 mg Oral Daily  . sodium chloride flush  3 mL Intravenous Q12H   Continuous Infusions: . sodium chloride      Principal Problem:   Hip fracture (HCC) Active Problems:   Hypertension   High cholesterol   Alzheimer's disease   Anemia  Time spent:   Irwin Brakeman, MD, FAAFP [facility] Pager [phone]  If 7PM-7AM, please contact night-coverage [email or web address] Password [email or web address] 09/14/2017, 2:40 PM    LOS: 0 days

## 2017-09-14 NOTE — Evaluation (Signed)
Physical Therapy Evaluation Patient Details Name: [name] MRN: [id number] DOB: 1929/09/14 Today's Date: 09/14/2017   History of Present Illness  [name] is a 84 y.o. female with medical history significant for Alzheimer's dementia, hypertension, dyslipidemia, normocytic anemia, and recent right acetabular and pubic ramus fracture (2 weeks ago) who was brought to the emergency department with complaints of some right-sided hip pain.  There was apparently no new fall or injury noted and the assisted living facility states that they cannot care for the patient any further and requests skilled nursing facility placement after discussion     Clinical Impression  Patient demonstrates slow labored movement for sitting up at bedside, unable to maintain NWB RLE when attempting to stand/transfer using RW, while sitting up bedside patient attempted to transfer to chair before writer was in front of her and sled to floor onto knees and writer prevented patient from hitting head on floor.  Patient had no c/o pain beyond baseline when moving RLE and required 2 person total assist to transfer to chair - RN notified.  Patient tolerated staying up in chair after therapy.  Patient will benefit from continued physical therapy in hospital and recommended venue below to increase strength, balance, endurance for safe ADLs and gait.    Follow Up Recommendations SNF    Equipment Recommendations  None recommended by PT(to be determined)    Recommendations for Other Services       Precautions / Restrictions Precautions Precautions: Fall Restrictions Weight Bearing Restrictions: Yes RLE Weight Bearing: Non weight bearing      Mobility  Bed Mobility Overal bed mobility: Needs Assistance Bed Mobility: Supine to Sit     Supine to sit: Max assist        Transfers Overall transfer level: Needs assistance Equipment used: 2 person hand held assist Transfers: Sit to/from [employer] Transfers Sit to Stand: Max assist;Total assist Stand pivot transfers: Max assist;Total assist       General transfer comment: Patient unable to maintain NWB RLE when attempting to transfer with a RW  Ambulation/Gait                Stairs            Wheelchair Mobility    Modified Rankin (Stroke Patients Only)       Balance Overall balance assessment: Needs assistance Sitting-balance support: Feet supported;Bilateral upper extremity supported Sitting balance-Leahy Scale: Fair Sitting balance - Comments: fair/poor and slipped off side of bed onto floor, requiring 2 person total assist to put in chair                                     Pertinent Vitals/Pain Pain Assessment: Faces Faces Pain Scale: Hurts little more Pain Location: RLE Pain Descriptors / Indicators: Grimacing;Guarding Pain Intervention(s): Limited activity within patient's tolerance;Monitored during session    Home Living Family/patient expects to be discharged to:: Assisted living                 Additional Comments: Pt is from ALF whom cannot provide with necessary level of care    Prior Function Level of Independence: Needs assistance   Gait / Transfers Assistance Needed: Unknown-pt reports she used facilities equipment occasionally   ADL's / Homemaking Assistance Needed: unknown-pt unable to provide history. Suspect pt required some assistance at baseline        Hand Dominance  Dominant Hand: (unknown)    Extremity/Trunk Assessment   Upper Extremity Assessment Upper Extremity Assessment: Defer to OT evaluation    Lower Extremity Assessment Lower Extremity Assessment: Generalized weakness;RLE deficits/detail;LLE deficits/detail RLE Deficits / Details: grossly -3/5 LLE Deficits / Details: grossly 3+/5    Cervical / Trunk Assessment Cervical / Trunk Assessment: Kyphotic  Communication   Communication: No difficulties  Cognition  Arousal/Alertness: Awake/alert Behavior During Therapy: WFL for tasks assessed/performed Overall Cognitive Status: History of cognitive impairments - at baseline                                 General Comments: No family/caregiver available to determine baseline cognition      General Comments      Exercises     Assessment/Plan    PT Assessment Patient needs continued PT services  PT Problem List Decreased strength;Decreased activity tolerance;Decreased balance;Decreased mobility       PT Treatment Interventions Gait training;Functional mobility training;Therapeutic activities;Therapeutic exercise;Patient/family education    PT Goals (Current goals can be found in the Care Plan section)  Acute Rehab PT Goals Patient Stated Goal: return home PT Goal Formulation: With patient Time For Goal Achievement: 09/28/17 Potential to Achieve Goals: Fair    Frequency Min 3X/week   Barriers to discharge        Co-evaluation PT/OT/SLP Co-Evaluation/Treatment: Yes Reason for Co-Treatment: Complexity of the patient's impairments (multi-system involvement);For patient/therapist safety;To address functional/ADL transfers PT goals addressed during session: Mobility/safety with mobility;Balance;Strengthening/ROM;Proper use of DME OT goals addressed during session: ADL's and self-care;Other (comment)(functional transfers)       AM-PAC PT "6 Clicks" Daily Activity  Outcome Measure Difficulty turning over in bed (including adjusting bedclothes, sheets and blankets)?: A Lot Difficulty moving from lying on back to sitting on the side of the bed? : A Lot Difficulty sitting down on and standing up from a chair with arms (e.g., wheelchair, bedside commode, etc,.)?: A Lot Help needed moving to and from a bed to chair (including a wheelchair)?: Total Help needed walking in hospital room?: Total Help needed climbing 3-5 steps with a railing? : Total 6 Click Score: 9    End of  Session   Activity Tolerance: Patient limited by fatigue Patient left: in chair;with call bell/phone within reach(left in chair with legs elevated) Nurse Communication: Mobility status;Other (comment)(RN notified that patient left up in chair with leg raised) PT Visit Diagnosis: Unsteadiness on feet (R26.81);Other abnormalities of gait and mobility (R26.89);Muscle weakness (generalized) (M62.81)    Time: 7858-8502 PT Time Calculation (min) (ACUTE ONLY): 23 min   Charges:   PT Evaluation $PT Eval High Complexity: 1 High PT Treatments $Therapeutic Activity: 23-37 mins   PT G Codes:        11:41 AM, 2017/10/03 Lonell Grandchild, MPT Physical Therapist with [facility] [phone] office [phone] mobile phone

## 2017-09-14 NOTE — Discharge Instructions (Signed)
Patient should remain nonweightbearing for 6 weeks until following up with orthopedist and having repeat imaging done.  Please be sure that patient follows up with Dr. Aline Brochure on 09/19/17 as scheduled. A fall precautions recommended.  Patient is very high risk for falling. Please check CBC and BMP in 1 week.   Fall Prevention in Hospitals, Adult WHAT ARE SOME SAFETY TIPS FOR PREVENTING FALLS? If you or a loved one has to stay in the hospital, talk with the health care providers about the risk of falling. Find out which medicines or treatments can cause dizziness or affect balance. Make a plan with the health care providers to prevent falls. The plan may include these points:  Ask for help moving around, especially after surgery or when feeling unwell.  Have support available when getting up and moving around.  Wear nonskid footwear.  Use the safety straps on wheelchairs.  Ask for help to get objects that are out of reach.  Wear eyeglasses.  Remove all clutter from the floor and the sides of the bed.  Keep equipment and wires securely out of the way.  Keep the bed locked in the low position.  Keep the side rails up on the bed.  Keep the nurse call button within reach.  Keep the door open when no one else is in the room.  Have someone stay in the hospital with you or your loved one.  Ask for a bed alarm if you are not able to stay with your loved one who is at risk for getting up without help.  Ask if sleeping pills or other medicines that alter mental status are necessary.  WHAT INCREASES THE RISK FOR FALLS? Certain conditions and treatments may increase a patient's risk for falls in a hospital. These include:  Being in an unfamiliar environment.  Being on bed rest.  Having surgery.  Taking certain medicines, such as sleeping pills.  Having tubes in place, such as IV lines or catheters.  Additional risk factors for falls in a hospital include:  Having vision  problems.  Having a change in thinking, feeling, or behavior (altered mental status).  Having trouble with balance.  Needing to use the toilet frequently.  Having fallen in the past three months.  Having low blood pressure when standing up quickly (orthostatic hypotension).  WHAT DOES THE HOSPITAL STAFF DO TO HELP PREVENT ME OR MY LOVED ONE FROM FALLING? Hospitals have systems in place to prevent falls and accidents. Talk with the hospital staff about:  Doing an assessment to discuss fall risks and create a personalized plan to prevent falls.  Checking in regularly to see if help is needed for moving around and to assess any changes in fall risk.  Knowing where the nurse call button is and how to use it. Use this to call a nursing care provider any time.  Keeping personal items within reach. This includes eyeglasses, phones, and other electronic devices.  Following general safety guidelines when moving around.  Keeping the area around the bed free from clutter.  Removing unnecessary equipment or tubes to reduce the risk of tripping.  Using safety equipment, such as: ? Walkers, crutches, and other walking devices for support. ? Safety rails on the bed. ? Safety straps in the bed. ? A bed that can be lowered and locked to prevent movement. ? Handrails in the bathroom. ? Nonskid socks and shoes. ? Locking mechanisms to secure equipment in place. ? Lifting and transfer equipment.  WHAT CAN I  DO TO HELP PREVENT A FALL?  Talk with health care providers about fall prevention.  Have a personalized fall prevention plan in place.  Do not try to move around if you feel off balance or ill.  Change position slowly.  Sit on the side of the bed before standing up.  Sit down and call for help if you feel dizzy or unsteady when standing.  Keep the hospital room clear of clutter.  WHEN SHOULD I ASK FOR HELP? Ask for help whenever you:  Are not sure if you are able to move  around safely.  Feel dizzy or unsteady.  Are not comfortable helping your loved one move around or use the bathroom.  If you or a loved one falls, tell the hospital staff. This is important. This information is not intended to replace advice given to you by your health care provider. Make sure you discuss any questions you have with your health care provider. Document Released: 07/01/2000 Document Revised: 12/01/2015 Document Reviewed: 04/16/2015 Elsevier Interactive Patient Education  2017 [place] Prevention in the Home Falls can cause injuries. They can happen to people of all ages. There are many things you can do to make your home safe and to help prevent falls. What can I do on the outside of my home?  Regularly fix the edges of walkways and driveways and fix any cracks.  Remove anything that might make you trip as you walk through a door, such as a raised step or threshold.  Trim any bushes or trees on the path to your home.  Use bright outdoor lighting.  Clear any walking paths of anything that might make someone trip, such as rocks or tools.  Regularly check to see if handrails are loose or broken. Make sure that both sides of any steps have handrails.  Any raised decks and porches should have guardrails on the edges.  Have any leaves, snow, or ice cleared regularly.  Use sand or salt on walking paths during winter.  Clean up any spills in your garage right away. This includes oil or grease spills. What can I do in the bathroom?  Use night lights.  Install grab bars by the toilet and in the tub and shower. Do not use towel bars as grab bars.  Use non-skid mats or decals in the tub or shower.  If you need to sit down in the shower, use a plastic, non-slip stool.  Keep the floor dry. Clean up any water that spills on the floor as soon as it happens.  Remove soap buildup in the tub or shower regularly.  Attach bath mats securely with double-sided  non-slip rug tape.  Do not have throw rugs and other things on the floor that can make you trip. What can I do in the bedroom?  Use night lights.  Make sure that you have a light by your bed that is easy to reach.  Do not use any sheets or blankets that are too big for your bed. They should not hang down onto the floor.  Have a firm chair that has side arms. You can use this for support while you get dressed.  Do not have throw rugs and other things on the floor that can make you trip. What can I do in the kitchen?  Clean up any spills right away.  Avoid walking on wet floors.  Keep items that you use a lot in easy-to-reach places.  If you need to reach  something above you, use a strong step stool that has a grab bar.  Keep electrical cords out of the way.  Do not use floor polish or wax that makes floors slippery. If you must use wax, use non-skid floor wax.  Do not have throw rugs and other things on the floor that can make you trip. What can I do with my stairs?  Do not leave any items on the stairs.  Make sure that there are handrails on both sides of the stairs and use them. Fix handrails that are broken or loose. Make sure that handrails are as long as the stairways.  Check any carpeting to make sure that it is firmly attached to the stairs. Fix any carpet that is loose or worn.  Avoid having throw rugs at the top or bottom of the stairs. If you do have throw rugs, attach them to the floor with carpet tape.  Make sure that you have a light switch at the top of the stairs and the bottom of the stairs. If you do not have them, ask someone to add them for you. What else can I do to help prevent falls?  Wear shoes that: ? Do not have high heels. ? Have rubber bottoms. ? Are comfortable and fit you well. ? Are closed at the toe. Do not wear sandals.  If you use a stepladder: ? Make sure that it is fully opened. Do not climb a closed stepladder. ? Make sure that both  sides of the stepladder are locked into place. ? Ask someone to hold it for you, if possible.  Clearly mark and make sure that you can see: ? Any grab bars or handrails. ? First and last steps. ? Where the edge of each step is.  Use tools that help you move around (mobility aids) if they are needed. These include: ? Canes. ? Walkers. ? Scooters. ? Crutches.  Turn on the lights when you go into a dark area. Replace any light bulbs as soon as they burn out.  Set up your furniture so you have a clear path. Avoid moving your furniture around.  If any of your floors are uneven, fix them.  If there are any pets around you, be aware of where they are.  Review your medicines with your doctor. Some medicines can make you feel dizzy. This can increase your chance of falling. Ask your doctor what other things that you can do to help prevent falls. This information is not intended to replace advice given to you by your health care provider. Make sure you discuss any questions you have with your health care provider. Document Released: 04/30/2009 Document Revised: 12/10/2015 Document Reviewed: 08/08/2014 Elsevier Interactive Patient Education  [employer].

## 2017-09-14 NOTE — NC FL2 (Signed)
[place] MEDICAID FL2 LEVEL OF CARE SCREENING TOOL     IDENTIFICATION  Patient Name: [name] Birthdate: Feb 09, 1930 Sex: female Admission Date (Current Location): 09/13/2017  [facility] and [place] Number:  [employer] and Address:  [place] [address] [place]      Provider Number: [id number]  Attending Physician Name and Address:  Murlean Iba, MD  Relative Name and Phone Number:       Current Level of Care:   Recommended Level of Care:   Prior Approval Number:    Date Approved/Denied:   PASRR Number: [id number]  Discharge Plan: SNF    Current Diagnoses: Patient Active Problem List   Diagnosis Date Noted  . Hip fracture ([place]) 09/13/2017  . Anemia 09/13/2017  . Right facial swelling   . Hypotension 04/25/2016  . Diarrhea 04/25/2016  . Acute renal failure ([place]) 04/25/2016  . Acute renal failure (ARF) ([place]) 04/25/2016  . Alzheimer's disease 09/11/2014  . Chest pain 05/24/2012  . Hypertension 08/30/2011  . High cholesterol 08/30/2011    Orientation RESPIRATION BLADDER Height & Weight     Place, Self  Normal Continent Weight:   Height:     BEHAVIORAL SYMPTOMS/MOOD NEUROLOGICAL BOWEL NUTRITION STATUS      Continent (Heart Healthy)  AMBULATORY STATUS COMMUNICATION OF NEEDS Skin   Total Care(Rt. hip fracture. patient should be nonweightbearing for 6 weeks ) Verbally Normal                       Personal Care Assistance Level of Assistance  Bathing, Feeding, Dressing Bathing Assistance: Limited assistance Feeding assistance: Independent Dressing Assistance: Limited assistance     Functional Limitations Info  Sight, Hearing, Speech Sight Info: Adequate Hearing Info: Adequate Speech Info: Adequate    SPECIAL CARE FACTORS FREQUENCY  OT (By licensed OT)     PT Frequency: 5x/week  OT Frequency: 3x/week            Contractures      Additional Factors Info  Code Status, Allergies,  Psychotropic Code Status Info: DNR Allergies Info: KNA Psychotropic Info: Xanax, Risperdal.          Current Medications (09/14/2017):  This is the current hospital active medication list Current Facility-Administered Medications  Medication Dose Route Frequency Provider Last Rate Last Dose  . 0.9 %  sodium chloride infusion  250 mL Intravenous PRN Manuella Ghazi, Pratik D, DO      . acetaminophen (TYLENOL) tablet 325-650 mg  325-650 mg Oral Q6H PRN Manuella Ghazi, Pratik D, DO      . ALPRAZolam [name]) tablet 0.5 mg  0.5 mg Oral TID PRN Manuella Ghazi, Pratik D, DO      . donepezil (ARICEPT) tablet 10 mg  10 mg Oral QHS Shah, Pratik D, DO   10 mg at 09/14/17 0121  . enoxaparin (LOVENOX) injection 40 mg  40 mg Subcutaneous Q24H Manuella Ghazi, Pratik D, DO   40 mg at 09/14/17 0122  . losartan (COZAAR) tablet 100 mg  100 mg Oral Daily Manuella Ghazi, Pratik D, DO   100 mg at 09/14/17 1052  . memantine (NAMENDA) tablet 10 mg  10 mg Oral BID Manuella Ghazi, Pratik D, DO   10 mg at 09/14/17 1052  . multivitamin with minerals tablet 1 tablet  1 tablet Oral Daily Manuella Ghazi, Pratik D, DO   1 tablet at 09/14/17 1053  . ondansetron (ZOFRAN) tablet 4 mg  4 mg Oral Q6H PRN Manuella Ghazi, Pratik D, DO  Or  . ondansetron (ZOFRAN) injection 4 mg  4 mg Intravenous Q6H PRN Manuella Ghazi, Pratik D, DO      . potassium chloride SA (K-DUR,KLOR-CON) CR tablet 20 mEq  20 mEq Oral Daily Manuella Ghazi, Pratik D, DO   20 mEq at 09/14/17 1052  . risperiDONE (RISPERDAL) tablet 1 mg  1 mg Oral QHS Shah, Pratik D, DO   1 mg at 09/14/17 0122  . saccharomyces boulardii (FLORASTOR) capsule 250 mg  250 mg Oral BID Manuella Ghazi, Pratik D, DO   250 mg at 09/14/17 1058  . simvastatin (ZOCOR) tablet 20 mg  20 mg Oral Daily Manuella Ghazi, Pratik D, DO   20 mg at 09/14/17 1053  . sodium chloride flush (NS) 0.9 % injection 3 mL  3 mL Intravenous Q12H Shah, Pratik D, DO   3 mL at 09/14/17 1058  . sodium chloride flush (NS) 0.9 % injection 3 mL  3 mL Intravenous PRN Heath Lark D, DO         Discharge Medications: Please see  discharge summary for a list of discharge medications.  Relevant Imaging Results:  Relevant Lab Results:   Additional Information    Kaydyn Chism, Clydene Pugh, LCSW

## 2017-09-14 NOTE — Care Management Obs Status (Signed)
[place] NOTIFICATION   Patient Details  Name: [name] MRN: [id number] Date of Birth: 04-29-1930   Medicare Observation Status Notification Given:  Yes    Sherald Barge, RN 09/14/2017, 1:40 PM

## 2017-09-14 NOTE — Plan of Care (Signed)
  Acute Rehab PT Goals(only PT should resolve) Pt Will Go Supine/Side To Sit 09/14/2017 1144 - Progressing by Lonell Grandchild, PT Flowsheets Taken 09/14/2017 1144  Pt will go Supine/Side to Sit with moderate assist Patient Will Transfer Sit To/From Stand 09/14/2017 1144 - Progressing by Lonell Grandchild, PT Flowsheets Taken 09/14/2017 1144  Patient will transfer sit to/from stand with moderate assist Pt Will Transfer Bed To Chair/Chair To Bed 09/14/2017 1144 - Progressing by Lonell Grandchild, PT Flowsheets Taken 09/14/2017 1144  Pt will Transfer Bed to Chair/Chair to Bed with mod assist Note Stand pivot on LLE Pt Will Ambulate 09/14/2017 1144 - Progressing by Lonell Grandchild, PT Flowsheets Taken 09/14/2017 1144  Pt will Ambulate 10 feet;with rolling walker;with maximum assist  11:45 AM, 09/14/17 Lonell Grandchild, MPT Physical Therapist with [facility] [phone] office [phone] mobile phone

## 2017-09-19 ENCOUNTER — Ambulatory Visit (INDEPENDENT_AMBULATORY_CARE_PROVIDER_SITE_OTHER): Payer: Medicare Other | Admitting: Orthopedic Surgery

## 2017-09-19 VITALS — BP 121/72 | HR 78 | Ht 63.0 in | Wt 170.0 lb

## 2017-09-19 DIAGNOSIS — S32431A Displaced fracture of anterior column [iliopubic] of right acetabulum, initial encounter for closed fracture: Secondary | ICD-10-CM

## 2017-09-19 NOTE — Progress Notes (Signed)
NEW PATIENT OFFICE VISIT   Chief Complaint  Patient presents with  . Hip Injury    Hospital follow up on right hip, DOI 09-01-17.    84 year old female presents for evaluation of right acetabular fracture  The patient fell low-energy fall on February 15 she was a Research officer, [employer]  She does complain of mild to moderate pain in the right hip associated with decreased ability to bear weight for the last 18 days with no associated limb deformity or swelling  She was admitted to the [facility] after attempting to stay at the assisted living center which was unsuccessful    Review of Systems  Unable to perform ROS: Dementia     Past Medical History:  Diagnosis Date  . Alzheimer's disease 09/11/2014  . Coronary artery disease   . Dementia   . High cholesterol   . Hypertension     Past Surgical History:  Procedure Laterality Date  . CATARACT EXTRACTION W/PHACO  12/13/2011   Procedure: CATARACT EXTRACTION PHACO AND INTRAOCULAR LENS PLACEMENT (IOC);  Surgeon: [name], MD;  Location: [facility];  Service: Ophthalmology;  Laterality: Right;  CDE 12.67  . CATARACT EXTRACTION W/PHACO  03/06/2012   Procedure: CATARACT EXTRACTION PHACO AND INTRAOCULAR LENS PLACEMENT (IOC);  Surgeon: [name], MD;  Location: [facility];  Service: Ophthalmology;  Laterality: Left;  CDE:13.12  . CHOLECYSTECTOMY    . COLONOSCOPY  09/28/2011   Procedure: COLONOSCOPY;  Surgeon: [name], MD;  Location: [facility] SUITE;  Service: Endoscopy;  Laterality: N/A;  1200    Family History  Problem Relation Age of Onset  . Dementia Sister   . Dementia Brother    Social History   Tobacco Use  . Smoking status: Former Smoker    Packs/day: 0.25    Years: 10.00    Pack years: 2.50    Last attempt to quit: 07/18/2007    Years since quitting: 10.1  . Smokeless tobacco: Never Used  Substance Use Topics  . Alcohol use: No    Alcohol/week: 0.0 oz  . Drug use: No    No Known Allergies   No  outpatient medications have been marked as taking for the 09/19/17 encounter (Office Visit) with Carole Civil, MD.    BP 121/72   Pulse 78   Ht 5\' 3"  (1.6 m)   Wt 170 lb (77.1 kg)   BMI 30.11 kg/m   Physical Exam  Constitutional: She appears well-developed and well-nourished.  Neurological: She is alert.   not oriented to time person and place  Psychiatric: She has a normal mood and affect. Judgment normal.  Vitals reviewed.   Ortho Exam   Lower extremities right and left painful left hip motion but no real leg length discrepancy.  She does appear to have knee flexion contractures bilaterally both knees are stable right hip feels stable muscle tone is normal skin is normal pulses are good sensation is intact  MEDICAL DECISION SECTION  xrays ordered? no  My independent reading of xrays: First x-ray and CT scan show a right acetabular fracture appears to involve anterior column  The most recent x-ray on 27 February shows slight pelvic protrusio from the fracture  Plan is to allow the patient to be weightbearing as tolerated with physical therapy and follow-up in 4 weeks for pelvic x-ray plus Judet views   Encounter Diagnosis  Name Primary?  . Closed displaced fracture of anterior column of right acetabulum, initial encounter ([place]) Yes  PLAN:   xrays 4 weeks wbat with walker

## 2017-09-26 ENCOUNTER — Telehealth: Payer: Self-pay | Admitting: Orthopedic Surgery

## 2017-09-26 NOTE — Telephone Encounter (Signed)
Ms. Wynetta Emery from [facility] called asking about clarification of this patient's weight bearing.  Please call and advise  [phone] and ask for PT

## 2017-09-26 NOTE — Telephone Encounter (Signed)
Plan is to allow the patient to be weightbearing as tolerated with physical therapy I have called to advise.

## 2017-10-17 DIAGNOSIS — S32431D Displaced fracture of anterior column [iliopubic] of right acetabulum, subsequent encounter for fracture with routine healing: Secondary | ICD-10-CM | POA: Insufficient documentation

## 2017-10-18 ENCOUNTER — Ambulatory Visit (INDEPENDENT_AMBULATORY_CARE_PROVIDER_SITE_OTHER): Payer: Self-pay | Admitting: Orthopedic Surgery

## 2017-10-18 ENCOUNTER — Ambulatory Visit (INDEPENDENT_AMBULATORY_CARE_PROVIDER_SITE_OTHER): Payer: Medicare Other

## 2017-10-18 DIAGNOSIS — S32431D Displaced fracture of anterior column [iliopubic] of right acetabulum, subsequent encounter for fracture with routine healing: Secondary | ICD-10-CM | POA: Diagnosis not present

## 2017-10-18 MED ORDER — TRAMADOL-ACETAMINOPHEN 37.5-325 MG PO TABS: 1.0000 | ORAL_TABLET | ORAL | 0 refills | Status: DC | PRN

## 2017-10-18 NOTE — Patient Instructions (Signed)
Weightbearing as tolerated!

## 2017-10-18 NOTE — Progress Notes (Signed)
Fracture care follow up    Fracture day  # 35 days   BP 114/67   Pulse 67   Ht 5\' 3"  (1.6 m)   Wt 170 lb (77.1 kg)   BMI 30.11 kg/m   Encounter Diagnosis  Name Primary?  . Closed fracture of anterior column of acetabulum, right, with routine healing, subsequent encounter 09/13/17    X-ray see report fracture is healing appropriately no further penetration of the head into the pelvis.  Right acetabular fracture  Plan  6 more weeks xrays   wbat

## 2017-10-27 ENCOUNTER — Emergency Department (HOSPITAL_COMMUNITY)
Admission: EM | Admit: 2017-10-27 | Discharge: 2017-10-27 | Disposition: A | Discharge status: Home / Self Care | Payer: Medicare Other | Attending: Emergency Medicine | Admitting: Emergency Medicine

## 2017-10-27 ENCOUNTER — Other Ambulatory Visit: Payer: Self-pay

## 2017-10-27 ENCOUNTER — Encounter (HOSPITAL_COMMUNITY): Payer: Self-pay | Admitting: Emergency Medicine

## 2017-10-27 DIAGNOSIS — Z87891 Personal history of nicotine dependence: Secondary | ICD-10-CM | POA: Diagnosis not present

## 2017-10-27 DIAGNOSIS — L89513 Pressure ulcer of right ankle, stage 3: Secondary | ICD-10-CM | POA: Insufficient documentation

## 2017-10-27 DIAGNOSIS — Z79899 Other long term (current) drug therapy: Secondary | ICD-10-CM | POA: Diagnosis not present

## 2017-10-27 DIAGNOSIS — I2510 Atherosclerotic heart disease of native coronary artery without angina pectoris: Secondary | ICD-10-CM | POA: Insufficient documentation

## 2017-10-27 DIAGNOSIS — I10 Essential (primary) hypertension: Secondary | ICD-10-CM | POA: Diagnosis not present

## 2017-10-27 DIAGNOSIS — M25571 Pain in right ankle and joints of right foot: Secondary | ICD-10-CM | POA: Diagnosis present

## 2017-10-27 DIAGNOSIS — G309 Alzheimer's disease, unspecified: Secondary | ICD-10-CM | POA: Insufficient documentation

## 2017-10-27 MED ORDER — BACITRACIN ZINC 500 UNIT/GM EX OINT
TOPICAL_OINTMENT | CUTANEOUS | Status: AC
  Filled 2017-10-27: qty 6.3

## 2017-10-27 MED ORDER — CEPHALEXIN 500 MG PO CAPS
500.0000 mg | ORAL_CAPSULE | Freq: Once | ORAL | Status: AC
  Administered 2017-10-27: 500 mg via ORAL
  Filled 2017-10-27: qty 1

## 2017-10-27 MED ORDER — CEPHALEXIN 500 MG PO CAPS: 500.0000 mg | ORAL_CAPSULE | Freq: Four times a day (QID) | ORAL | 0 refills | Status: AC

## 2017-10-27 MED ORDER — BACITRACIN-NEOMYCIN-POLYMYXIN 400-5-5000 EX OINT
TOPICAL_OINTMENT | Freq: Once | CUTANEOUS | Status: AC
  Administered 2017-10-27: 7 via TOPICAL

## 2017-10-27 NOTE — ED Notes (Signed)
Continues to await transport to [facility]

## 2017-10-27 NOTE — ED Notes (Addendum)
DC instruction reviewed with family Rx to family member Call to [facility] [phone] Message left on answering machine was well as call back number   Pt awaiting transport home

## 2017-10-27 NOTE — ED Notes (Signed)
Report to Elberta Fortis of [facility]  He is informed of referral number on second page of DC instruction, the need for daily dressing change The need for Q2H turns The need for pillows foam between the legs to decrease pressure And the family is getting prescriptions filled

## 2017-10-27 NOTE — Discharge Instructions (Addendum)
Keep skin clean and dry.  Do not rub or massage skin. Move or change positions every 2 hours. Use pillows or cushions to remove pressure.  I have ordered wound care daily. Ambulatory referral to wound clinic given.

## 2017-10-27 NOTE — ED Notes (Signed)
Pt in with complaint of wound to R lateral lower leg seeping thru dressing that has been replaced x 3 today- and is odoriferous  Odor noted from pt diaper which has large amount of light brown colored stool that has dried edges in diaper- sacral wounds x 2 noted stage 2 Allevyn wound cover applied to sacrum Wound/scratch noted to outer L labial fold cleaned Wound noted to L inner knee stage 3 noted with dressing applied Wound to R lateral leg unstagable with granulation noted to wound edges, and tendon visualized- Wound dressed using telfa, 4x4s and kerlix x 2 Wounds noted to R inner lower leg x 4: wounds cleansed bacitracin applied and wrapped Heels bilaterally dark and R heel dressed -   Family at bedside

## 2017-10-27 NOTE — ED Notes (Signed)
Family to bedside   Questions encouraged and answered  Dr Lemmie Evens to bedside to discuss findings and plan of care  Family states pt is a DNR, but that she is supposed to go to hospital if care needed

## 2017-10-27 NOTE — ED Provider Notes (Signed)
[facility] EMERGENCY DEPARTMENT Provider Note   CSN: [id number] Arrival date & time: 10/27/17  1720     History   Chief Complaint Chief Complaint  Patient presents with  . Wound Infection    HPI [name] is a 84 y.o. female.  Pt presents to the ED today with a wound infection to right leg.  Pt has alzheimer's disease, so she is unable to give any hx.  It is unclear how long pt has had this wound.         Past Medical History:  Diagnosis Date  . Alzheimer's disease 09/11/2014  . Coronary artery disease   . Dementia   . High cholesterol   . Hypertension     Patient Active Problem List   Diagnosis Date Noted  . Closed fracture of anterior column of acetabulum, right, with routine healing, subsequent encounter 09/13/17 10/17/2017  . Hip fracture ([place]) 09/13/2017  . Anemia 09/13/2017  . Right facial swelling   . Hypotension 04/25/2016  . Diarrhea 04/25/2016  . Acute renal failure ([place]) 04/25/2016  . Acute renal failure (ARF) ([place]) 04/25/2016  . Alzheimer's disease 09/11/2014  . Chest pain 05/24/2012  . Hypertension 08/30/2011  . High cholesterol 08/30/2011    Past Surgical History:  Procedure Laterality Date  . CATARACT EXTRACTION W/PHACO  12/13/2011   Procedure: CATARACT EXTRACTION PHACO AND INTRAOCULAR LENS PLACEMENT (IOC);  Surgeon: [name], MD;  Location: [facility] ORS;  Service: Ophthalmology;  Laterality: Right;  CDE 12.67  . CATARACT EXTRACTION W/PHACO  03/06/2012   Procedure: CATARACT EXTRACTION PHACO AND INTRAOCULAR LENS PLACEMENT (IOC);  Surgeon: [name], MD;  Location: [facility] ORS;  Service: Ophthalmology;  Laterality: Left;  CDE:13.12  . CHOLECYSTECTOMY    . COLONOSCOPY  09/28/2011   Procedure: COLONOSCOPY;  Surgeon: [name], MD;  Location: [facility] ENDO SUITE;  Service: Endoscopy;  Laterality: N/A;  1200     OB History   None      Home Medications    Prior to Admission medications   Medication Sig Start Date End Date Taking?  Authorizing Provider  acetaminophen (TYLENOL) 325 MG tablet Take 325-650 mg by mouth every 6 (six) hours as needed.    Yes [provider]  atorvastatin (LIPITOR) 10 MG tablet Take 1 tablet by mouth daily. 10/16/17  Yes [provider]  diclofenac sodium (VOLTAREN) 1 % GEL Apply topically 4 (four) times daily.   Yes [provider]  donepezil (ARICEPT) 10 MG tablet Take 1 tablet (10 mg total) by mouth at bedtime. 10/12/15  Yes [name], NP  HYDROcodone-acetaminophen (NORCO/VICODIN) 5-325 MG tablet Take 1 tablet by mouth 3 (three) times daily as needed for severe pain. 10/12/17  Yes [provider]  losartan (COZAAR) 100 MG tablet Take 100 mg by mouth daily.   Yes [provider]  memantine (NAMENDA) 10 MG tablet Take 1 tablet (10 mg total) by mouth 2 (two) times daily. 10/12/15  Yes [name], NP  potassium chloride (K-DUR) 10 MEQ tablet Take 1 tablet by mouth daily. 10/20/17  Yes [provider]  ALPRAZolam [name]) 0.5 MG tablet Take 1 tablet (0.5 mg total) by mouth 3 (three) times daily as needed for anxiety. For anxiety Patient not taking: Reported on 10/27/2017 09/14/17   Murlean Iba, MD  cephALEXin (KEFLEX) 500 MG capsule Take 1 capsule (500 mg total) by mouth 4 (four) times daily. 10/27/17   Isla Pence, MD  potassium chloride SA (K-DUR,KLOR-CON) 10 MEQ  tablet Take 1 tablet (10 mEq total) by mouth daily. Reported on 10/12/2015 Patient not taking: Reported on 10/27/2017 09/14/17   Murlean Iba, MD    Family History Family History  Problem Relation Age of Onset  . Dementia Sister   . Dementia Brother     Social History Social History   Tobacco Use  . Smoking status: Former Smoker    Packs/day: 0.25    Years: 10.00    Pack years: 2.50    Last attempt to quit: 07/18/2007    Years since quitting: 10.2  . Smokeless tobacco: Never Used  Substance Use Topics  . Alcohol use: No    Alcohol/week: 0.0 oz  . Drug  use: No     Allergies   Patient has no known allergies.   Review of Systems Review of Systems  Unable to perform ROS: Dementia     Physical Exam Updated Vital Signs BP (!) 118/55 (BP Location: Right Arm)   Pulse 72   Temp 99.8 F (37.7 C) (Rectal)   Resp 18   Ht 5\' 3"  (1.6 m)   Wt 77.1 kg (170 lb)   SpO2 91%   BMI 30.11 kg/m   Physical Exam  Constitutional: She appears well-developed and well-nourished.  HENT:  Head: Normocephalic and atraumatic.  Right Ear: External ear normal.  Left Ear: External ear normal.  Nose: Nose normal.  Mouth/Throat: Oropharynx is clear and moist.  Eyes: Pupils are equal, round, and reactive to light. Conjunctivae and EOM are normal.  Neck: Normal range of motion. Neck supple.  Cardiovascular: Normal rate, regular rhythm, normal heart sounds and intact distal pulses.  Pulmonary/Chest: Effort normal and breath sounds normal.  Abdominal: Soft. Bowel sounds are normal.  Musculoskeletal: Normal range of motion.  Neurological: She is alert.  Pt is awake and knows her name.  Skin: Capillary refill takes less than 2 seconds.  Pt has several decubitus ulcers on sacrum, labia, both lower legs.  Worst (on right lateral lower leg) is pictured below.  No surrounding cellulitis.    Psychiatric: She has a normal mood and affect. Her behavior is normal. Judgment and thought content normal.  Nursing note and vitals reviewed.      ED Treatments / Results  Labs (all labs ordered are listed, but only abnormal results are displayed) Labs Reviewed - No data to display  EKG None  Radiology No results found.  Procedures Procedures (including critical care time)  Medications Ordered in ED Medications  bacitracin 500 UNIT/GM ointment (has no administration in time range)  cephALEXin (KEFLEX) capsule 500 mg (has no administration in time range)  neomycin-bacitracin-polymyxin (NEOSPORIN) ointment (7 application Topical Given 10/27/17 1808)      Initial Impression / Assessment and Plan / ED Course  I have reviewed the triage vital signs and the nursing notes.  Pertinent labs & imaging results that were available during my care of the patient were reviewed by me and considered in my medical decision making (see chart for details).    Pt's family here now.  They were unaware of the severity until I showed them the picture of her leg. Pt is getting wound care now through advanced home care 3 times/ week.  That needs to increase.  Pt also given referral to wound clinic.  Return if worse.  Final Clinical Impressions(s) / ED Diagnoses   Final diagnoses:  Pressure injury of right ankle, stage 3 [facility] [place])    ED Discharge Orders  Ordered    Ambulatory referral to Wound Clinic    Comments:  Multiple decubitus/nonhealing wounds.   10/27/17 1821    Home Health    Comments:  Pt is getting wound care 3 times a week through advanced home care.   10/27/17 1830    Face-to-face encounter (required for Medicare/Medicaid patients)    Comments:  I Isla Pence certify that this patient is under my care and that I, or a nurse practitioner or physician's assistant working with me, had a face-to-face encounter that meets the physician face-to-face encounter requirements with this patient on 10/27/2017. The encounter with the patient was in whole, or in part for the following medical condition(s) which is the primary reason for home health care (List medical condition): ambulatory dysfunction   10/27/17 1830    cephALEXin (KEFLEX) 500 MG capsule  4 times daily     10/27/17 1831       Isla Pence, MD 10/27/17 1831

## 2017-10-27 NOTE — ED Triage Notes (Signed)
Wound to R leg followed by home care at [facility] Nurse sent to hospital for wound smelling  During dressing change- Reports wound weeping thru 3 dressing changes today  Pt has paperwork stating she is a DNR, not to be sent to hospital  But is from previous care facility No DNR sent w pt

## 2017-11-09 ENCOUNTER — Ambulatory Visit (HOSPITAL_COMMUNITY): Payer: Medicare Other | Attending: Adult Health Nurse Practitioner

## 2017-11-09 DIAGNOSIS — X58XXXA Exposure to other specified factors, initial encounter: Secondary | ICD-10-CM | POA: Insufficient documentation

## 2017-11-09 DIAGNOSIS — L89893 Pressure ulcer of other site, stage 3: Secondary | ICD-10-CM | POA: Diagnosis not present

## 2017-11-09 DIAGNOSIS — L89894 Pressure ulcer of other site, stage 4: Secondary | ICD-10-CM | POA: Diagnosis not present

## 2017-11-09 DIAGNOSIS — R2689 Other abnormalities of gait and mobility: Secondary | ICD-10-CM | POA: Diagnosis not present

## 2017-11-09 DIAGNOSIS — S81802A Unspecified open wound, left lower leg, initial encounter: Secondary | ICD-10-CM | POA: Insufficient documentation

## 2017-11-09 DIAGNOSIS — G309 Alzheimer's disease, unspecified: Secondary | ICD-10-CM | POA: Diagnosis not present

## 2017-11-09 DIAGNOSIS — S81801A Unspecified open wound, right lower leg, initial encounter: Secondary | ICD-10-CM | POA: Insufficient documentation

## 2017-11-09 DIAGNOSIS — F0280 Dementia in other diseases classified elsewhere without behavioral disturbance: Secondary | ICD-10-CM | POA: Diagnosis not present

## 2017-11-09 DIAGNOSIS — L89302 Pressure ulcer of unspecified buttock, stage 2: Secondary | ICD-10-CM | POA: Diagnosis not present

## 2017-11-10 ENCOUNTER — Other Ambulatory Visit: Payer: Self-pay

## 2017-11-10 ENCOUNTER — Encounter (HOSPITAL_COMMUNITY): Payer: Self-pay

## 2017-11-10 NOTE — Therapy (Signed)
[place], [place], [address] Phone: [phone]   Fax:  [phone]  Wound Care Evaluation  Patient Details  Name: [name] MRN: [id number] Date of Birth: February 04, 1930 Referring Provider: Pablo Lawrence, NP   Encounter Date: 11/09/2017  PT End of Session - 11/09/17 1811    Visit Number  1    Number of Visits  13    Date for PT Re-Evaluation  12/07/17    Authorization Type  [employer] [facility] Medicare (no auth required, no visit limit)    Authorization Time Period  11/09/17-12/07/17    Authorization - Visit Number  1    Authorization - Number of Visits  10    PT Start Time  1350    PT Stop Time  1547    PT Time Calculation (min)  117 min    Activity Tolerance  Patient tolerated treatment well    Behavior During Therapy  [facility] for tasks assessed/performed       Past Medical History:  Diagnosis Date   Alzheimer's disease 09/11/2014   Coronary artery disease    Dementia    High cholesterol    Hypertension     Past Surgical History:  Procedure Laterality Date   CATARACT EXTRACTION [facility]  12/13/2011   Procedure: CATARACT EXTRACTION PHACO AND INTRAOCULAR LENS PLACEMENT ([place]);  Surgeon: [name], MD;  Location: [facility] ORS;  Service: Ophthalmology;  Laterality: Right;  CDE 12.67   CATARACT EXTRACTION W/PHACO  03/06/2012   Procedure: CATARACT EXTRACTION PHACO AND INTRAOCULAR LENS PLACEMENT (IOC);  Surgeon: [name], MD;  Location: [facility] ORS;  Service: Ophthalmology;  Laterality: Left;  CDE:13.12   CHOLECYSTECTOMY     COLONOSCOPY  09/28/2011   Procedure: COLONOSCOPY;  Surgeon: [name], MD;  Location: [facility] ENDO SUITE;  Service: Endoscopy;  Laterality: N/A;  1200    There were no vitals filed for this visit.     11/09/17 0001  Assessment  Medical Diagnosis Multiple Bil LE Wounds and Sacral Wound  Referring Provider Pablo Lawrence, NP  Onset Date/Surgical Date 09/01/17  Prior  [place] for Wounds; PT for gait/mobility training for several weeks at [facility] prior to moving to [place]  Has the patient fallen in the past 6 months Yes  How many times? 1  Has the patient had a decrease in activity level because of a fear of falling?  Yes  Is the patient reluctant to leave their home because of a fear of falling?  Yes  [facility]  Additional Comments Patient is currently at [facility]; previously lived at [place] from 10/2015 until her fall on 09/01/17. She spent some time at [facility] after being discharged from the hospital on 09/14/17, before moving to [facility] around 10/16/17.  Prior Function  Level of Independence Independent with household mobility with device (prior to her fall on 09/01/17)  Vocation Retired  Associate Professor  Overall Cognitive Status History of cognitive impairments - at baseline (family present to confrim, 3+ year history of alzheimer's)  Observation/Other Assessments  Skin Integrity Severe breakdown of skin on Bil LE with most severe wound on Rt lateral lower leg and multiple wounds on both LE. Some skin breakdown at sacral region.  Observation/Other Assessments-Edema   Edema  (Rt LE edema)      11/09/17 0001  Subjective Assessment  Subjective Patient has significant history of alzheimer's disease and her son, Ronalee Belts, and caregiver from [facility], Seth Bake, are present to provide some history of present condition. History also obtained through thorough chart review. Patient experienced a fall  out of bed on 09/01/17 and fractured her Rt pubic ramus and acetabulum. Prior to her fall she was independent to ambulate with RW and independent with other mobility. She was living at [facility] assisted living. [name] was was not a surgical candidate and was required to remain NWB  on her Rt LE for 6 weeks after her fall. She was unable to return to [facility] after being discharged from [facility] on 09/14/17 and was discharged to [facility] in [place] where she had physical therapy for mobility training for several weeks. She was then transferred to [facility] around 10/16/17 and the patients son and her caregiver report she had developed her wounds prior to being transferred to [place]. She has been receiving [facility] nursing to manage her sacral pressure injury and Bil LE wounds. She did not progress with physical therapy at [facility] and has remained non-ambulatory and currently requires Maximum to Total assist for transfers.  Patient and Family Stated Goals Wounds to heal and patient to have no pain.  Date of Onset 09/21/17 (approximate, believe wounds developed at prior facility)  Prior Treatments [place] for Wounds  Pain Assessment  Pain Scale Faces  Faces Pain Scale 4  Pain Type Acute pain  Pain Location Leg  Pain Orientation Right;Left;Lower  Patients Stated Pain Goal 0  Pain Intervention(s) Distraction;Emotional support  Evaluation and Treatment  Evaluation and Treatment Procedures Explained to Patient/Family Yes  Evaluation and Treatment Procedures Patient unable to consent due to mental status (family agreed to treatment, pt verbalized agreement as well)  Pressure Injury 11/09/17 Stage II -  Partial thickness loss of dermis presenting as a shallow open ulcer with a red, pink wound bed without slough. central buttocks wound, small granular pink wound with surrounding periwound in healed epithelialized stat  Date First Assessed/Time First Assessed: 11/09/17 1410   Location: Buttocks  Location Orientation: Mid;Upper  Staging: Stage II -  Partial thickness loss of dermis presenting as a shallow open ulcer with a red, pink wound bed without slough.  Wound De...  Dressing Type Gauze (Comment) (petroleum, guaze,  sacral foam patch with gel adhesive)  Dressing Changed  Dressing Change Frequency PRN  State of Healing Epithelialized  Site / Wound Assessment Clean;Dry;Pink  % Wound base Red or Granulating 100% (pink early epithelialization)  Peri-wound Assessment Intact (2 small  areas of epithelialized healed wounds on Rt/Lt)  Wound Length (cm) 1 cm  Wound Width (cm) 0.4 cm  Wound Surface Area (cm^2) 0.4 cm^2  Margins Attached edges (approximated)  Drainage Amount None  Treatment Cleansed;Other (Comment) (petroleum, guaze, sacral foam patch with gel adhesive)  Pressure Injury 11/09/17 Stage III -  Full thickness tissue loss. Subcutaneous fat may be visible but bone, tendon or muscle are NOT exposed. Wound at left medial knee, wider at superior end of wound and narrower distally  Date First Assessed/Time First Assessed: 11/09/17 1414   Location: Knee  Location Orientation: Left;Medial  Staging: Stage III -  Full thickness tissue loss. Subcutaneous fat may be visible but bone, tendon or muscle are NOT exposed.  Wound Descriptio...  Dressing Type Impregnated gauze (bismuth);Gauze (Comment);Tape dressing  Dressing Old drainage (marked)  Dressing Change Frequency  PRN  State of Healing Early/partial granulation  Site / Wound Assessment Pink;Granulation tissue  % Wound base Red or Granulating 55%  % Wound base Yellow/Fibrinous Exudate 45%  Peri-wound Assessment Intact  Wound Length (cm) 5 cm  Wound Width (cm) 4 cm (4 cm at superior end, 2 cm at distal end)  Wound Depth (cm) 0.3 cm  Wound Surface Area (cm^2) 20 cm^2  Wound Volume (cm^3) 6 cm^3  Margins Attached edges (approximated)  Drainage Amount Minimal  Drainage Description Serosanguineous  Treatment Cleansed;Debridement (Selective);Other (Comment) (xeroform, gauze, medipore tape)  Pressure Injury 11/09/17 Stage III -  Full thickness tissue loss. Subcutaneous fat may be visible but bone, tendon or muscle are NOT exposed. Most superior and middle  wound on Left heel  Date First Assessed/Time First Assessed: 11/09/17 1421   Location: Heel  Location Orientation: Left  Staging: Stage III -  Full thickness tissue loss. Subcutaneous fat may be visible but bone, tendon or muscle are NOT exposed.  Wound Description (Comm...  Dressing Type Impregnated gauze (bismuth);Gauze (Comment);Tape dressing  Dressing Old drainage (marked)  Dressing Change Frequency PRN  State of Healing Early/partial granulation  Site / Wound Assessment Red;Brown  % Wound base Red or Granulating 70% (pink/red)  % Wound base Black/Eschar 30%  Peri-wound Assessment Intact;Erythema (non-blanchable) (extends/surrounds >1 cm from edge of all 3 wounds on heel)  Wound Length (cm) 0.9 cm  Wound Width (cm) 1.2 cm  Wound Depth (cm) 0.2 cm  Wound Surface Area (cm^2) 1.08 cm^2  Wound Volume (cm^3) 0.22 cm^3  Margins Attached edges (approximated)  Drainage Amount Scant  Drainage Description Serosanguineous  Treatment Cleansed;Debridement (Selective);Other (Comment) (xeroform, gauze, medipore tape)  Pressure Injury 11/09/17 Stage II -  Partial thickness loss of dermis presenting as a shallow open ulcer with a red, pink wound bed without slough. Slightly lateral and distal to most superior pressure wound on left heel  Date First Assessed/Time First Assessed: 11/09/17 1432   Location: Heel  Location Orientation: Left  Staging: Stage II -  Partial thickness loss of dermis presenting as a shallow open ulcer with a red, pink wound bed without slough.  Wound Description...  Dressing Type Impregnated gauze (bismuth);Gauze (Comment);Tape dressing  Dressing Old drainage (marked)  Dressing Change Frequency PRN  State of Healing Early/partial granulation  Site / Wound Assessment Red;Brown  % Wound base Red or Granulating 50%  % Wound base Other/Granulation Tissue (Comment) 50%  Peri-wound Assessment Erythema (non-blanchable);Intact (extends/surrounds >1 cm from edge of all 3 wounds on  heel)  Wound Length (cm) 1 cm  Wound Width (cm) 0.8 cm  Wound Depth (cm) 0.1 cm  Wound Surface Area (cm^2) 0.8 cm^2  Wound Volume (cm^3) 0.08 cm^3  Margins Attached edges (approximated)  Drainage Amount Scant  Drainage Description Serosanguineous  Treatment Cleansed;Debridement (Selective) (xeroform, gauze, medipore tape)  Pressure Injury 11/09/17 Stage III -  Full thickness tissue loss. Subcutaneous fat may be visible but bone, tendon or muscle are NOT exposed. Slightly medial and distal to most superior wound on left heel  Date First Assessed/Time First Assessed: 11/09/17 1432   Location: Heel  Location Orientation: Left  Staging: Stage III -  Full thickness tissue loss. Subcutaneous fat may be visible but bone, tendon or muscle are NOT exposed.  Wound Description (Comm...  Dressing Type Impregnated gauze (bismuth);Gauze (Comment);Tape dressing  Dressing Old drainage (marked)  Dressing Change Frequency PRN  State of Healing Early/partial granulation  Site / Wound Assessment Brown;Red  % Wound base Red or  Granulating 80%  % Wound base Black/Eschar 20%  Peri-wound Assessment Intact;Erythema (non-blanchable) (extends/surrounds >1 cm from edge of all 3 wounds on heel)  Wound Length (cm) 1.4 cm  Wound Width (cm) 2.2 cm  Wound Depth (cm) 0.3 cm  Wound Surface Area (cm^2) 3.08 cm^2  Wound Volume (cm^3) 0.92 cm^3  Margins Attached edges (approximated)  Drainage Amount Scant  Drainage Description Serosanguineous  Treatment Cleansed;Debridement (Selective) (xeroform, gauze, medipore tape)  Pressure Injury 11/09/17 Stage IV - Full thickness tissue loss with exposed bone, tendon or muscle. Right lower leg; Superior to lateral malleolus and distal to lateral femoral condyle  Date First Assessed/Time First Assessed: 11/09/17 1440   Location: Leg  Location Orientation: Right;Lower;Lateral  Staging: Stage IV - Full thickness tissue loss with exposed bone, tendon or muscle.  Wound Description  (Comments): Right lower leg; Supe...  Dressing Type Impregnated gauze (bismuth);Tape dressing;ABD  Dressing Old drainage (marked)  Dressing Change Frequency PRN  State of Healing Non-healing  Site / Wound Assessment Brown;Pink;Pale;Painful;Yellow;Black;Red  % Wound base Yellow/Fibrinous Exudate 70%  % Wound base Black/Eschar 15%  % Wound base Other/Granulation Tissue (Comment) 15%  Peri-wound Assessment Edema;Purple  Wound Length (cm) 12.6 cm  Wound Width (cm) 6.7 cm  Wound Depth (cm) 2.7 cm  Wound Surface Area (cm^2) 84.42 cm^2  Wound Volume (cm^3) 227.93 cm^3  Margins Epibole (rolled edges)  Drainage Amount Copious  Drainage Description Serosanguineous  Treatment Cleansed (xeroform, abdominal pad, medipore tape)  Pressure Injury 11/09/17 Stage III -  Full thickness tissue loss. Subcutaneous fat may be visible but bone, tendon or muscle are NOT exposed. Right lateral malleolus, partially granulating with slough and necrotic tissue present  Date First Assessed/Time First Assessed: 11/09/17 1440   Location: Ankle  Location Orientation: Lateral;Right  Staging: Stage III -  Full thickness tissue loss. Subcutaneous fat may be visible but bone, tendon or muscle are NOT exposed.  Wound Descrip...  Dressing Type Impregnated gauze (bismuth);Gauze (Comment);Tape dressing;Hydrogel  Dressing Old drainage (marked)  Dressing Change Frequency PRN  State of Healing Non-healing  Site / Wound Assessment Brown;Red;Yellow;Pink  % Wound base Yellow/Fibrinous Exudate 40%  % Wound base Other/Granulation Tissue (Comment) 60% (pink)  Peri-wound Assessment Edema  Wound Length (cm) 3.3 cm  Wound Width (cm) 2.3 cm  Wound Depth (cm) 0.8 cm  Wound Surface Area (cm^2) 7.59 cm^2  Wound Volume (cm^3) 6.07 cm^3  Margins Epibole (rolled edges)  Drainage Amount Minimal  Drainage Description Serosanguineous  Treatment Cleansed;Debridement (Selective) (hydrogel, xeroform, gauze, medipore tape)  Pressure Injury  11/09/17 Unstageable - Full thickness tissue loss in which the base of the ulcer is covered by slough (yellow, tan, gray, green or brown) and/or eschar (tan, brown or black) in the wound bed. circular wound at base of 5th metatarsel on le  Date First Assessed/Time First Assessed: 11/09/17 1442   Location: Foot  Location Orientation: Right  Staging: Unstageable - Full thickness tissue loss in which the base of the ulcer is covered by slough (yellow, tan, gray, green or brown) and/or esch...  Dressing Type Impregnated gauze (bismuth);Gauze (Comment);Tape dressing;Hydrogel  Dressing Old drainage (marked)  Dressing Change Frequency PRN  State of Healing Non-healing  Site / Wound Assessment Black;Dry  % Wound base Black/Eschar 100%  Peri-wound Assessment Black;Edema  Wound Length (cm) 1.3 cm  Wound Width (cm) 1.3 cm  Wound Depth (cm) 0 cm  Wound Surface Area (cm^2) 1.69 cm^2  Wound Volume (cm^3) 0 cm^3  Margins Attached edges (approximated) (unstagable and difficult to  define)  Drainage Amount None  Treatment Cleansed (xeroform, gauze, medipore tape)  Wound / Incision (Open or Dehisced) 11/09/17 Other (Comment) Leg Lower;Right;Medial;Anterior Non-helaing wound on right anteromedial lower leg, slightly distal and medial to tibial tuberosity  Date First Assessed/Time First Assessed: 11/09/17 1510   Wound Type: (c) Other (Comment)  Location: Leg  Location Orientation: Lower;Right;Medial;Anterior  Wound Description (Comments): Non-helaing wound on right anteromedial lower leg, slightly dista...  Dressing Type Gauze (Comment);Hydrogel;Impregnated gauze (bismuth);Tape dressing  Dressing Changed Changed  Dressing Status Old drainage  Dressing Change Frequency PRN  Site / Wound Assessment Black;Brown;Pink;Red;Painful;Yellow  % Wound base Red or Granulating 25%  % Wound base Yellow/Fibrinous Exudate 20%  % Wound base Black/Eschar 20%  % Wound base Other/Granulation Tissue (Comment) 35% (pink/pale)   Peri-wound Assessment Intact;Edema  Wound Length (cm) 1 cm  Wound Width (cm) 1.1 cm  Wound Depth (cm) 0.5 cm  Wound Volume (cm^3) 0.55 cm^3  Wound Surface Area (cm^2) 1.1 cm^2  Margins Epibole (rolled edges)  Drainage Amount Scant  Drainage Description Serosanguineous  Treatment Cleansed;Debridement (Selective) (hydrogel, xeroform, gauze, medipore tape)  Wound / Incision (Open or Dehisced) 11/09/17 Other (Comment) Leg Right;Medial;Lower Right medial lower leg wound (slightly anterior), approximately halfway between knee and ankle  Date First Assessed/Time First Assessed: 11/09/17 1510   Wound Type: (c) Other (Comment)  Location: Leg  Location Orientation: Right;Medial;Lower  Wound Description (Comments): Right medial lower leg wound (slightly anterior), approximately halfway be...  Dressing Type Impregnated gauze (bismuth);Gauze (Comment);Tape dressing;Hydrogel  Dressing Changed Changed  Dressing Status Old drainage  Dressing Change Frequency PRN  Site / Wound Assessment Yellow;Pink;Brown  % Wound base Yellow/Fibrinous Exudate 60%  % Wound base Black/Eschar 10%  % Wound base Other/Granulation Tissue (Comment) 30% (pink)  Peri-wound Assessment Edema;Intact  Wound Length (cm) 1.8 cm  Wound Width (cm) 1.2 cm  Wound Depth (cm) 0.6 cm  Wound Volume (cm^3) 1.3 cm^3  Wound Surface Area (cm^2) 2.16 cm^2  Margins Epibole (rolled edges)  Drainage Amount Scant  Drainage Description Serosanguineous  Treatment Cleansed;Debridement (Selective) (hydrogel, xeroform, gauze, medipore tape)  Wound / Incision (Open or Dehisced) 11/09/17 Other (Comment) Foot Right;Medial Right medial foot, circular wound  Date First Assessed/Time First Assessed: 11/09/17 1510   Wound Type: (c) Other (Comment)  Location: Foot  Location Orientation: Right;Medial  Wound Description (Comments): Right medial foot, circular wound  Present on Admission: Yes  Dressing Type Gauze (Comment);Hydrogel;Impregnated gauze  (bismuth);Tape dressing  Dressing Changed Changed  Dressing Status Old drainage  Dressing Change Frequency PRN  Site / Wound Assessment Brown;Pink;Yellow;Painful  % Wound base Yellow/Fibrinous Exudate 50%  % Wound base Black/Eschar 10%  % Wound base Other/Granulation Tissue (Comment) 40% (pink)  Peri-wound Assessment Edema;Intact  Wound Length (cm) 2.5 cm  Wound Width (cm) 2.9 cm  Wound Depth (cm) 0.4 cm  Wound Volume (cm^3) 2.9 cm^3  Wound Surface Area (cm^2) 7.25 cm^2  Margins Epibole (rolled edges)  Drainage Amount Scant  Drainage Description Serosanguineous  Treatment Cleansed;Debridement (Selective) (hydrogel, xeroform, gauze, medipore tape)  Selective Debridement  Selective Debridement - Location Wounds on Bil LE  Selective Debridement - Tools Used Forceps;Scalpel;Scissors  Selective Debridement - Tissue Removed necrotic/devitalized tissue and slough  Wound Therapy - Assess/Plan/Recommendations  Wound Therapy - Clinical Statement Ms. Lana presents for wound evaluation in outpatient clinic for sacral and bilateral lower extremity wounds. Sacral pressure sores are virtually healed with small area at central upper buttock of early pink granulation tissue surrounded by epithelialized and healed skin. Lt LE  presents with large pressure sore at medial knee that has signs of early granulation but has significant amount of slough present. Lt heel has 3 separated pressure sores that are surrounded by reddened peri-wound with non-blanchable erythema. Lt heel sores have some necrotic tissue present but minimal slough. Rt LE has many wounds along lateral and medial leg. Largest wound is stage IV pressure injury along lateral Rt leg with bone, muscle, and tendon visible with a surface area o 84.42 cm^2. The remaining 5 wounds measured on the Rt LE are circular in nature and have minimal drainage. The patient Rt leg is swollen however no swellignis present in the Lt LE. The patient requires an  urgent vascular surgical referral to assess circulatory pathologies that may be present and could be and underlying cause of wounds or could impact wound healing. I have called and spoken with the patients primary physician office, Dr .Celene Squibb, and they have stated they will begin placing the order. Ms. Flannery will benefit from skilled wound care management for multiple wound on Bil LE and to provide education on pressure relief, mobility, and self-care and care giver education to improve wound healing and improve QOL.  Wound Therapy - Functional Problem List difficulty perfomring functional mobility due to pain  Factors Delaying/Impairing Wound Healing Immobility;Incontinence (patient needs vascular surgeon consult)  Hydrotherapy Plan Debridement;Dressing change;Patient/family education  Wound Therapy - Frequency 3X / week  Wound Therapy - Current Recommendations PT;Other (comment) (vascular surgeon consult)  Wound Therapy - Follow Up Recommendations Other (comment) (vascular surgeon consult)  Wound Plan Measure wounds on Wednesday. Follow up on vascular surgeon consult/referral before any debridement to large wound on Rt lateral lower leg. Selective debridement to necrotic tissues and to wound bed/edges to promote healing and apply appropriate wound dressing to promote moist environment. Educate family/caregivers on proper pressure relief and hourly turns to promote healing. Educate on proper care/management of dressings for caregivers to keep dry and clean and intact.  Wound Therapy Goals - Improve the function of patient's integumentary system by progressing the wound(s) through the phases of wound healing by:  Decrease Length/Width/Depth by (cm) Reduce surfaces area of combined wounds by 25% in 4 weeks to demonstrate improved wound healing.  Decrease Length/Width/Depth - Progress Goal set today  Patient/Family will be able to  Will verbalize understanding of proper pressure relief  schedule with turns/position change every 1 hour to prevent new wounds and promote healing of present wounds.  Patient/Family Instruction Goal - Progress Goal set today  Additional Wound Therapy Goal Patient's family/caregivers will obtain vascular surgical consult/referral and schedule visit for vascular testing to determine underlying pathologies.  Additional Wound Therapy Goal - Progress Goal set today       Objective measurements completed on examination: See above findings.      PT Education - 11/09/17 1813    Education provided  Yes    Education Details  Educated on stages of wounds and on treatment interventions for wounds care with sharps debridement. Educated on keeping dressing dry and intact, educated to not remove dressings unless they become soiled. If dressing becomes soiled remove and cover with new clean/dry dressing. Educated that patient can not recieve home health wound care if she is coming to outpatient clinic for wound care. Educated on importance of patient getting a referral to a vascular surgeon for her Right leg wound and to check for circulation problems with bilateral lower extremities to determine if there are any venous or arterial pathologies present.  Person(s) Educated  Patient;Child(ren);Caregiver(s) Winfred Burn; caregiver Seth Bake    Methods  Explanation;Demonstration;Handout written instructions in [place]    Comprehension  Need further instruction       PT Short Term Goals - 11/09/17 1941      PT SHORT TERM GOAL #1   Title  Patient's family/caregivers will obtain vascular surgical consult/referral and schedule visit for vascular testing to determine underlying pathologies.    Time  1    Period  Weeks    Status  New    Target Date  11/16/17      PT SHORT TERM GOAL #2   Title  Will verbalize understanding of proper pressure relief schedule with turns/position change every 1 hour to prevent new wounds and promote healing of present wounds.     Time  1    Period  Weeks    Status  New        PT Long Term Goals - 11/09/17 1948      PT LONG TERM GOAL #1   Title  Reduce surfaces area of combined wounds by 25% in 4 weeks to demonstrate improved wound healing.    Time  4    Period  Weeks    Status  New    Target Date  12/07/17         Plan - 11/09/17 1758    Clinical Impression Statement  see plan above    Clinical Presentation  Stable    Clinical Decision Making  High    Rehab Potential  Fair    PT Frequency  3x / week    PT Duration  4 weeks    PT Treatment/Interventions  ADLs/Self Care Home Management;Patient/family education;Manual techniques;Taping;Other (comment) selective dirbridement and cleansing of wounds; wound care/dressing    PT Next Visit Plan  Measure wounds on Wednesday. Follow up on vascular surgeon consult/referral before any debridement to large wound on Rt lateral lower leg. Selective debridement to necrotic tissues and to wound bed/edges to promote healing and apply appropriate wound dressing to promote moist environment. Educate family/caregivers on proper pressure relief and hourly turns to promote healing. Educate on proper care/management of dressings for caregivers to keep dry and clean and intact.    Consulted and Agree with Plan of Care  Patient;Family member/caregiver    Family Member Consulted  patient's son, Ronalee Belts; caregiver from [employer] home, Seth Bake       Patient will benefit from skilled therapeutic intervention in order to improve the following deficits and impairments:  Decreased skin integrity, Difficulty walking, Decreased mobility, Decreased cognition, Increased edema, Pain  Visit Diagnosis: Pressure injury of right leg, stage 4 (HCC)  Pressure injury of left knee, stage 3 (HCC)  Multiple open wounds of left lower extremity, initial encounter  Multiple open wounds of right lower extremity, initial encounter  Pressure injury of buttock, stage 2, unspecified laterality  Other  abnormalities of gait and mobility    Problem List Patient Active Problem List   Diagnosis Date Noted   Closed fracture of anterior column of acetabulum, right, with routine healing, subsequent encounter 09/13/17 10/17/2017   Hip fracture ([place]) 09/13/2017   Anemia 09/13/2017   Right facial swelling    Hypotension 04/25/2016   Diarrhea 04/25/2016   Acute renal failure ([place]) 04/25/2016   Acute renal failure (ARF) ([place]) 04/25/2016   Alzheimer's disease 09/11/2014   Chest pain 05/24/2012   Hypertension 08/30/2011   High cholesterol 08/30/2011    Kipp Brood, PT, DPT Physical Therapist  with [facility]  11/10/2017 6:29 PM    [place], [place], [address] Phone: [phone]   Fax:  [phone]  Name: [name] MRN: [id number] Date of Birth: [birth year]

## 2017-11-12 ENCOUNTER — Encounter (HOSPITAL_COMMUNITY): Payer: Self-pay | Admitting: Emergency Medicine

## 2017-11-12 ENCOUNTER — Emergency Department (HOSPITAL_COMMUNITY)
Admission: EM | Admit: 2017-11-12 | Discharge: 2017-11-13 | Disposition: A | Discharge status: Home / Self Care | Payer: Medicare Other | Attending: Emergency Medicine | Admitting: Emergency Medicine

## 2017-11-12 ENCOUNTER — Emergency Department (HOSPITAL_COMMUNITY): Payer: Medicare Other

## 2017-11-12 DIAGNOSIS — I10 Essential (primary) hypertension: Secondary | ICD-10-CM | POA: Diagnosis not present

## 2017-11-12 DIAGNOSIS — Z87891 Personal history of nicotine dependence: Secondary | ICD-10-CM | POA: Insufficient documentation

## 2017-11-12 DIAGNOSIS — L089 Local infection of the skin and subcutaneous tissue, unspecified: Secondary | ICD-10-CM

## 2017-11-12 DIAGNOSIS — S81801D Unspecified open wound, right lower leg, subsequent encounter: Secondary | ICD-10-CM | POA: Diagnosis present

## 2017-11-12 DIAGNOSIS — I2510 Atherosclerotic heart disease of native coronary artery without angina pectoris: Secondary | ICD-10-CM | POA: Diagnosis not present

## 2017-11-12 DIAGNOSIS — Z79899 Other long term (current) drug therapy: Secondary | ICD-10-CM | POA: Insufficient documentation

## 2017-11-12 DIAGNOSIS — R2231 Localized swelling, mass and lump, right upper limb: Secondary | ICD-10-CM | POA: Diagnosis not present

## 2017-11-12 DIAGNOSIS — G309 Alzheimer's disease, unspecified: Secondary | ICD-10-CM | POA: Diagnosis not present

## 2017-11-12 DIAGNOSIS — X58XXXD Exposure to other specified factors, subsequent encounter: Secondary | ICD-10-CM | POA: Diagnosis not present

## 2017-11-12 DIAGNOSIS — T148XXA Other injury of unspecified body region, initial encounter: Secondary | ICD-10-CM

## 2017-11-12 DIAGNOSIS — Z5189 Encounter for other specified aftercare: Secondary | ICD-10-CM

## 2017-11-12 LAB — CBC WITH DIFFERENTIAL/PLATELET
Basophils Absolute: 0 10*3/uL (ref 0.0–0.1)
Basophils Relative: 0 %
EOS ABS: 0 10*3/uL (ref 0.0–0.7)
Eosinophils Relative: 0 %
HCT: 33.8 % — ABNORMAL LOW (ref 36.0–46.0)
HEMOGLOBIN: 10.4 g/dL — AB (ref 12.0–15.0)
LYMPHS ABS: 1.8 10*3/uL (ref 0.7–4.0)
LYMPHS PCT: 18 %
MCH: 27.3 pg (ref 26.0–34.0)
MCHC: 30.8 g/dL (ref 30.0–36.0)
MCV: 88.7 fL (ref 78.0–100.0)
MONOS PCT: 8 %
Monocytes Absolute: 0.8 10*3/uL (ref 0.1–1.0)
NEUTROS PCT: 74 %
Neutro Abs: 7.5 10*3/uL (ref 1.7–7.7)
Platelets: 305 10*3/uL (ref 150–400)
RBC: 3.81 MIL/uL — AB (ref 3.87–5.11)
RDW: 13.9 % (ref 11.5–15.5)
WBC: 10.2 10*3/uL (ref 4.0–10.5)

## 2017-11-12 LAB — BASIC METABOLIC PANEL
Anion gap: 16 — ABNORMAL HIGH (ref 5–15)
BUN: 13 mg/dL (ref 6–20)
CHLORIDE: 101 mmol/L (ref 101–111)
CO2: 23 mmol/L (ref 22–32)
CREATININE: 0.83 mg/dL (ref 0.44–1.00)
Calcium: 9.2 mg/dL (ref 8.9–10.3)
GFR calc Af Amer: >60 mL/min (ref >60)
GFR calc non Af Amer: >60 mL/min (ref >60)
GLUCOSE: 95 mg/dL (ref 65–99)
POTASSIUM: 5 mmol/L (ref 3.5–5.1)
SODIUM: 140 mmol/L (ref 135–145)

## 2017-11-12 MED ORDER — DOXYCYCLINE HYCLATE 100 MG PO TABS
100.0000 mg | ORAL_TABLET | Freq: Once | ORAL | Status: AC
  Administered 2017-11-12: 100 mg via ORAL
  Filled 2017-11-12: qty 1

## 2017-11-12 MED ORDER — DOXYCYCLINE HYCLATE 100 MG PO CAPS: 100.0000 mg | ORAL_CAPSULE | Freq: Two times a day (BID) | ORAL | 0 refills | Status: AC

## 2017-11-12 NOTE — ED Notes (Signed)
EMS called to set up transportation back to [facility].

## 2017-11-12 NOTE — Discharge Instructions (Signed)
Please go to the wound care center for your normal visit on Tuesday Doxycycline 100 mg by mouth twice a day for 10 days Seek medical attention for fever pain or worsening symptoms

## 2017-11-12 NOTE — ED Provider Notes (Signed)
[facility] EMERGENCY DEPARTMENT Provider Note   CSN: [id number] Arrival date & time: 11/12/17  1530     History   Chief Complaint Chief Complaint  Patient presents with  . Wound Check    HPI [name] is a 84 y.o. female.  HPI  The patient is an 84 year old female with a known history of Alzheimer's dementia as well as high blood pressure and high cholesterol.  The patient is unable to give any history thus a level 5 caveat applies.  According to the transport team the patient is currently a patient of a group home type facility.  They report that she has had some increased foul odor coming from the bandages of her right lower extremity.  She was seen for the same wound on 12 April and prescribed cephalexin thinking that it may be infected.  She finished those medications and has been following up with the wound care center.  Additional history was obtained from the son and his wife who state that they were at the wound care visit with her 3 days ago, dressings were changed and they were advised not to change the dressings again until Tuesday when she was to return unless the dressings became wet.  Because of the foul odor she was sent to the hospital today.  There was no report of fevers vomiting or any other complaints, the patient has no complaints.  Past Medical History:  Diagnosis Date  . Alzheimer's disease 09/11/2014  . Coronary artery disease   . Dementia   . High cholesterol   . Hypertension     Patient Active Problem List   Diagnosis Date Noted  . Closed fracture of anterior column of acetabulum, right, with routine healing, subsequent encounter 09/13/17 10/17/2017  . Hip fracture ([place]) 09/13/2017  . Anemia 09/13/2017  . Right facial swelling   . Hypotension 04/25/2016  . Diarrhea 04/25/2016  . Acute renal failure ([place]) 04/25/2016  . Acute renal failure (ARF) ([place]) 04/25/2016  . Alzheimer's disease 09/11/2014  . Chest pain 05/24/2012  . Hypertension  08/30/2011  . High cholesterol 08/30/2011    Past Surgical History:  Procedure Laterality Date  . CATARACT EXTRACTION W/PHACO  12/13/2011   Procedure: CATARACT EXTRACTION PHACO AND INTRAOCULAR LENS PLACEMENT (IOC);  Surgeon: [name], MD;  Location: [facility] ORS;  Service: Ophthalmology;  Laterality: Right;  CDE 12.67  . CATARACT EXTRACTION W/PHACO  03/06/2012   Procedure: CATARACT EXTRACTION PHACO AND INTRAOCULAR LENS PLACEMENT (IOC);  Surgeon: [name], MD;  Location: [facility] ORS;  Service: Ophthalmology;  Laterality: Left;  CDE:13.12  . CHOLECYSTECTOMY    . COLONOSCOPY  09/28/2011   Procedure: COLONOSCOPY;  Surgeon: [name], MD;  Location: [facility] ENDO SUITE;  Service: Endoscopy;  Laterality: N/A;  1200     OB History   None      Home Medications    Prior to Admission medications   Medication Sig Start Date End Date Taking? Authorizing Provider  acetaminophen (TYLENOL) 325 MG tablet Take 325-650 mg by mouth every 6 (six) hours as needed.     [provider]  ALPRAZolam [name]) 0.5 MG tablet Take 1 tablet (0.5 mg total) by mouth 3 (three) times daily as needed for anxiety. For anxiety Patient not taking: Reported on 10/27/2017 09/14/17   Murlean Iba, MD  atorvastatin (LIPITOR) 10 MG tablet Take 1 tablet by mouth daily. 10/16/17   [provider]  cephALEXin (KEFLEX) 500 MG capsule Take 1 capsule (500  mg total) by mouth 4 (four) times daily. 10/27/17   Isla Pence, MD  diclofenac sodium (VOLTAREN) 1 % GEL Apply topically 4 (four) times daily.    [provider]  donepezil (ARICEPT) 10 MG tablet Take 1 tablet (10 mg total) by mouth at bedtime. 10/12/15   [name], NP  doxycycline (VIBRAMYCIN) 100 MG capsule Take 1 capsule (100 mg total) by mouth 2 (two) times daily. 11/12/17   Noemi Chapel, MD  HYDROcodone-acetaminophen (NORCO/VICODIN) 5-325 MG tablet Take 1 tablet by mouth 3 (three) times daily as needed for severe pain. 10/12/17   [provider]  losartan (COZAAR) 100 MG tablet Take 100 mg by mouth daily.    [provider]  memantine (NAMENDA) 10 MG tablet Take 1 tablet (10 mg total) by mouth 2 (two) times daily. 10/12/15   [name], NP  potassium chloride (K-DUR) 10 MEQ tablet Take 1 tablet by mouth daily. 10/20/17   [provider]  potassium chloride SA (K-DUR,KLOR-CON) 10 MEQ tablet Take 1 tablet (10 mEq total) by mouth daily. Reported on 10/12/2015 Patient not taking: Reported on 10/27/2017 09/14/17   Murlean Iba, MD    Family History Family History  Problem Relation Age of Onset  . Dementia Sister   . Dementia Brother     Social History Social History   Tobacco Use  . Smoking status: Former Smoker    Packs/day: 0.25    Years: 10.00    Pack years: 2.50    Last attempt to quit: 07/18/2007    Years since quitting: 10.3  . Smokeless tobacco: Never Used  Substance Use Topics  . Alcohol use: No    Alcohol/week: 0.0 oz  . Drug use: No     Allergies   Patient has no known allergies.   Review of Systems Review of Systems  Unable to perform ROS: Dementia     Physical Exam Updated Vital Signs BP (!) 145/75   Pulse 91   Temp 98.6 F (37 C) (Oral)   Resp 14   Ht 5\' 3"  (1.6 m)   Wt 77.1 kg (170 lb)   SpO2 (!) 76%   BMI 30.11 kg/m   Physical Exam  Constitutional: She appears well-developed and well-nourished. No distress.  HENT:  Head: Normocephalic and atraumatic.  Mouth/Throat: Oropharynx is clear and moist. No oropharyngeal exudate.  Eyes: Pupils are equal, round, and reactive to light. Conjunctivae and EOM are normal. Right eye exhibits no discharge. Left eye exhibits no discharge. No scleral icterus.  Neck: Normal range of motion. Neck supple. No JVD present. No thyromegaly present.  Cardiovascular: Normal rate, regular rhythm, normal heart sounds and intact distal pulses. Exam reveals no gallop and no friction rub.  No murmur heard. Pulmonary/Chest:  Effort normal and breath sounds normal. No respiratory distress. She has no wheezes. She has no rales.  Abdominal: Soft. Bowel sounds are normal. She exhibits no distension and no mass. There is no tenderness.  Musculoskeletal: Normal range of motion. She exhibits no edema or tenderness.  There is no edema of the lower extremities  Lymphadenopathy:    She has no cervical adenopathy.  Neurological: She is alert. Coordination normal.  Patient is able to follow commands, she is able to speak, she does not answer my questions with very good memory but has normal coordination  Skin: Skin is warm and dry. No rash noted. No erythema.  There are open wounds to the right lower extremity at the same location as  per the prior chart in April.  Pictures are almost exactly the same, no additional images were taken as they are identical.  There is no surrounding cellulitis, no induration, no warmth, there is a small amount of purulent drainage in the wounds.  This was cleaned out.  Psychiatric: She has a normal mood and affect. Her behavior is normal.  Nursing note and vitals reviewed.    ED Treatments / Results  Labs (all labs ordered are listed, but only abnormal results are displayed) Labs Reviewed  CBC WITH DIFFERENTIAL/PLATELET - Abnormal; Notable for the following components:      Result Value   RBC 3.81 (*)    Hemoglobin 10.4 (*)    HCT 33.8 (*)    All other components within normal limits  BASIC METABOLIC PANEL - Abnormal; Notable for the following components:   Anion gap 16 (*)    All other components within normal limits    EKG None  Radiology Dg Hand 2 View Right  Result Date: 11/12/2017 CLINICAL DATA:  Right hand swelling. EXAM: RIGHT HAND - 2 VIEW COMPARISON:  Right hand x-rays dated April 13, 2015. FINDINGS: No acute fracture or dislocation. Prior distal radius ORIF. Moderate osteoarthritis of the IP joints. Severe osteopenia. Mild soft tissue swelling. IMPRESSION: Mild diffuse  soft tissue swelling.  No acute osseous abnormality. Electronically Signed   By: Titus Dubin M.D.   On: 11/12/2017 17:33    Procedures Procedures (including critical care time)  Medications Ordered in ED Medications  doxycycline (VIBRA-TABS) tablet 100 mg (100 mg Oral Given 11/12/17 1754)     Initial Impression / Assessment and Plan / ED Course  I have reviewed the triage vital signs and the nursing notes.  Pertinent labs & imaging results that were available during my care of the patient were reviewed by me and considered in my medical decision making (see chart for details).  Clinical Course as of Nov 13 1823  Sun Nov 12, 2017  1824 Labs reviewed, no leukocytosis, renal function totally preserved, the patient appears stable for discharge with normal vital signs.  Oxygen on my exam is over 98% on room air   [BM]    Clinical Course User Index [BM] Noemi Chapel, MD   The patient does not appear septic, she is not tachycardic febrile or hypotensive.  She has an ongoing open wound, the foul smell was removed with the dressings which were soaked in slightly necrotic.  The underlying wound appears clean with a good wound bed.  The foul smell has essentially resolved after the wound was redressed.  I believe that the foul smell was just the drainage from the wound but there does not appear to be any signs of necrotizing fasciitis or deeper tissue infection.  Her compartment is very soft and she does not have any tenderness when I press around the wound.  She will need to have a different antibiotic and I will start doxycycline as she has not had MRSA coverage.  Final Clinical Impressions(s) / ED Diagnoses   Final diagnoses:  Visit for wound check  Wound infection    ED Discharge Orders        Ordered    doxycycline (VIBRAMYCIN) 100 MG capsule  2 times daily     11/12/17 Christy Gentles, MD 11/12/17 1825

## 2017-11-12 NOTE — ED Notes (Signed)
Report given to Rosetta at [facility].

## 2017-11-12 NOTE — ED Triage Notes (Signed)
Pt arrived from [facility] for evaluation of right leg wound with drainage.  Seen by wound care on Thursday.  Also family states right hand is more swollen than left.

## 2017-11-13 ENCOUNTER — Telehealth (HOSPITAL_COMMUNITY): Payer: Self-pay | Admitting: Physical Therapy

## 2017-11-13 ENCOUNTER — Telehealth (HOSPITAL_COMMUNITY): Payer: Self-pay

## 2017-11-13 NOTE — Telephone Encounter (Signed)
Pt wil be d/c from homehealth PT and Wound care per Carin Primrose - he will make the request today 11/10/17 and send [place] proof when completed. Elberta Fortis will nee progress notes for instruction of pt care while at the center. Refaxed request d/c 11/13/17

## 2017-11-13 NOTE — Telephone Encounter (Signed)
Director of Case Management from [facility] wound center [phone] to cx and d/c patient=family requested she be seen in [place]

## 2017-11-13 NOTE — Telephone Encounter (Signed)
Called Mr. Huel Cote at the center to let know of time change. NF 11/13/17

## 2017-11-14 ENCOUNTER — Encounter

## 2017-11-14 ENCOUNTER — Ambulatory Visit (HOSPITAL_COMMUNITY): Payer: Medicare Other | Admitting: Physical Therapy

## 2017-11-16 ENCOUNTER — Ambulatory Visit (HOSPITAL_COMMUNITY): Payer: Medicare Other | Admitting: Physical Therapy

## 2017-11-20 ENCOUNTER — Ambulatory Visit (HOSPITAL_COMMUNITY): Payer: Medicare Other

## 2017-11-22 ENCOUNTER — Ambulatory Visit (HOSPITAL_COMMUNITY): Payer: Medicare Other

## 2017-11-27 ENCOUNTER — Ambulatory Visit (HOSPITAL_COMMUNITY): Payer: Medicare Other

## 2017-11-27 ENCOUNTER — Encounter (HOSPITAL_COMMUNITY): Payer: Self-pay

## 2017-11-27 NOTE — Therapy (Signed)
[facility] [address] [place], [place], [address] Phone: [phone]   Fax:  [phone]  Patient Details  Name: [name] MRN: [id number] Date of Birth: 07/12/1930 Referring Provider:  No ref. provider found  Encounter Date: 11/27/2017   PHYSICAL THERAPY DISCHARGE SUMMARY  Visits from Start of Care: 1  Current functional level related to goals / functional outcomes: Patient was admitted to SNF shortly after evaluation for wound here at [facility]. She was admitted to [facility] in [place], [place] and will be treated there for her extensive wounds on bil LE's and for all other skilled medical needs. I spoke with Brandy at [facility] and informed her the patient will benefit from a vascular surgeon consultation/referral and from treatment at a hyperbaric wound center.    Remaining deficits: Multiple wounds on bil LE.   Education / Equipment: Educated patient's family and caregivers on recommendation for vascular consult and hyperbaric wound center treatment.   Plan: Patient agrees to discharge.  Patient goals were not met. Patient is being discharged due to the patient's request.  ?????       Rachel Quinn-Brown, PT, DPT Physical Therapist with [facility]  11/27/2017 8:31 AM      [facility] [address] [place], [place], [address] Phone: [phone]   Fax:  [phone] 

## 2017-11-29 ENCOUNTER — Ambulatory Visit (HOSPITAL_COMMUNITY): Payer: Medicare Other | Admitting: Physical Therapy

## 2017-12-01 ENCOUNTER — Ambulatory Visit (HOSPITAL_COMMUNITY): Payer: Medicare Other

## 2017-12-04 ENCOUNTER — Ambulatory Visit (HOSPITAL_COMMUNITY): Payer: Medicare Other

## 2017-12-06 ENCOUNTER — Ambulatory Visit: Payer: Medicare Other | Admitting: Orthopedic Surgery

## 2017-12-06 ENCOUNTER — Ambulatory Visit (HOSPITAL_COMMUNITY): Payer: Medicare Other

## 2017-12-08 ENCOUNTER — Ambulatory Visit: Payer: Medicare Other | Admitting: Orthopedic Surgery

## 2017-12-08 ENCOUNTER — Ambulatory Visit (HOSPITAL_COMMUNITY): Payer: Medicare Other

## 2018-01-15 DEATH — deceased

## 2019-04-01 IMAGING — CT CT HEAD W/O CM
3 of 7 series · 15 of 47 positions shown, 18 images · non-contrast
Comparison: 04/11/2017, MRI 12/06/2010

CLINICAL DATA: Facial droop and altered LOC

EXAM:
CT HEAD WITHOUT CONTRAST
CT MAXILLOFACIAL WITHOUT CONTRAST
TECHNIQUE: Multidetector CT imaging of the head and maxillofacial structures
were performed using the standard protocol without intravenous
contrast. Multiplanar CT image reconstructions of the maxillofacial
structures were also generated.

[Series 6: sagittal soft tissue · sagittal · 0.35mm/px · 3 of 59 slices shown]
[im 12/59  brain]
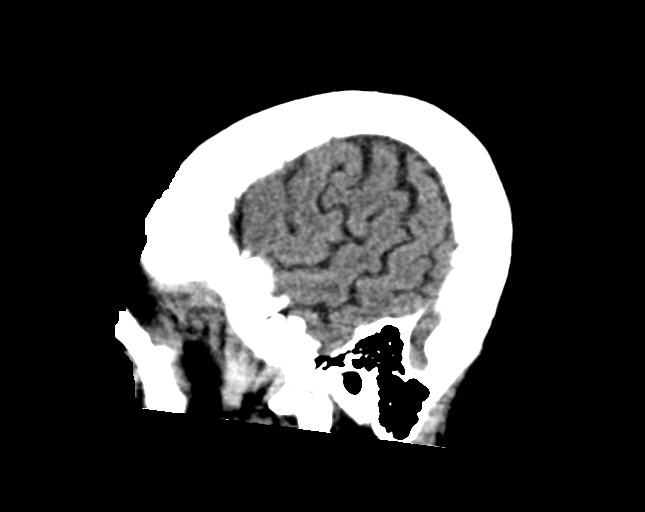
[im 24/59  brain]
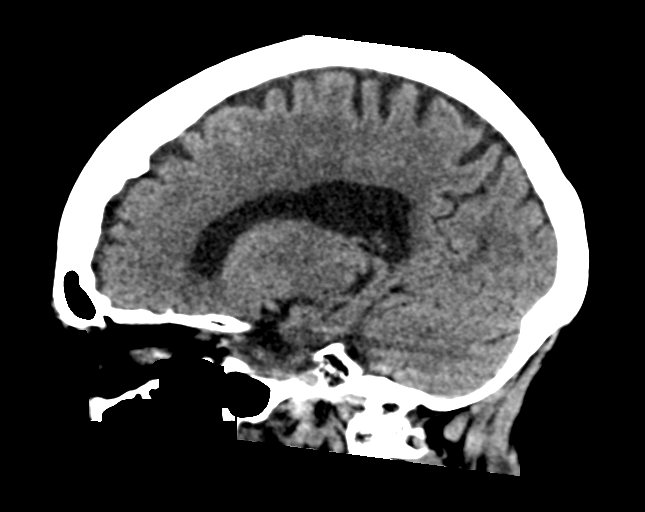
[im 35/59  brain]
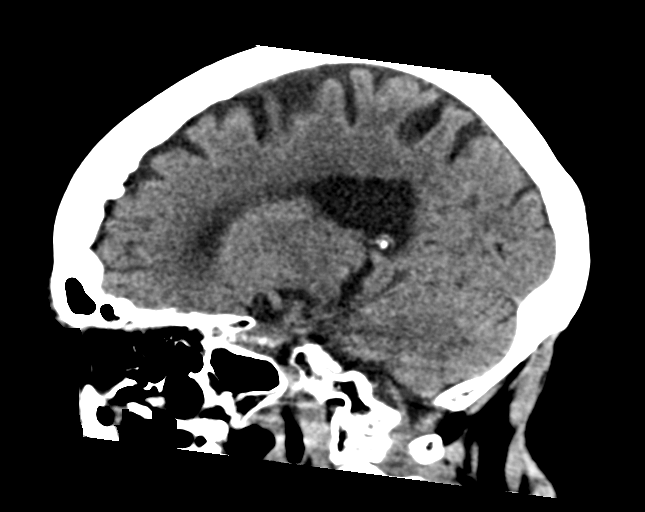

[Series 7: max soft · axial · 0.36mm/px · z∈[-112,+32]mm · 10 of 86 slices shown, 13 images]
[im 9/86  brain]
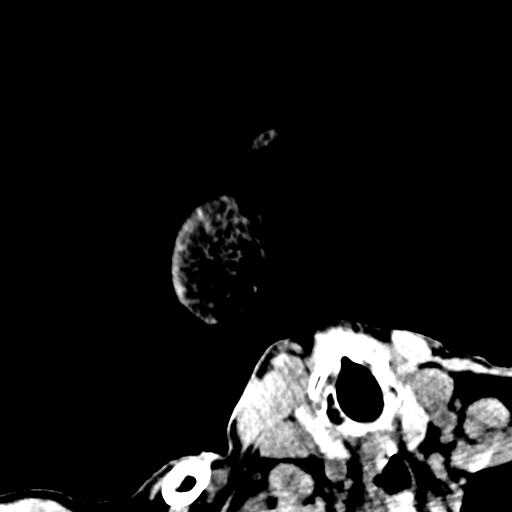
[im 9/86  bone]
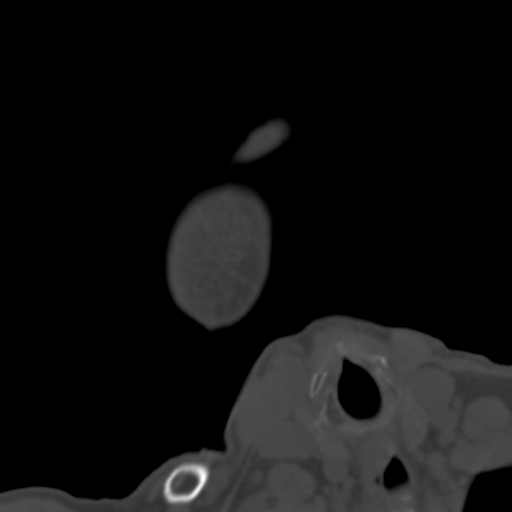
[im 17/86  brain]
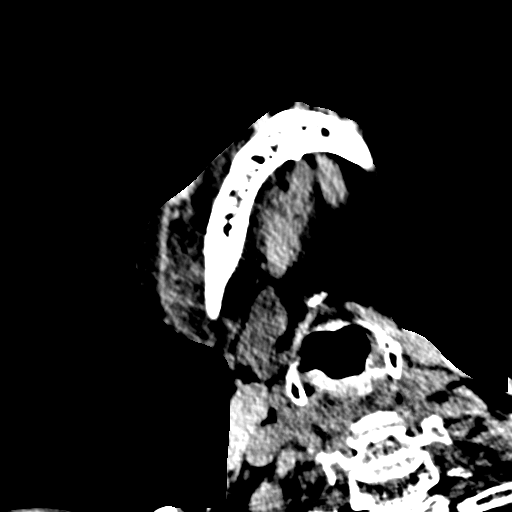
[im 25/86  brain]
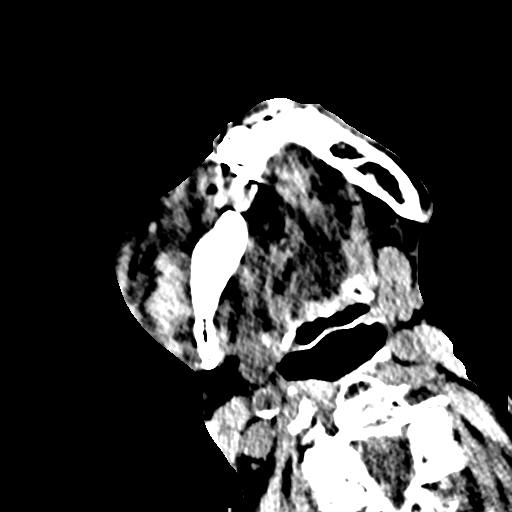
[im 33/86  brain]
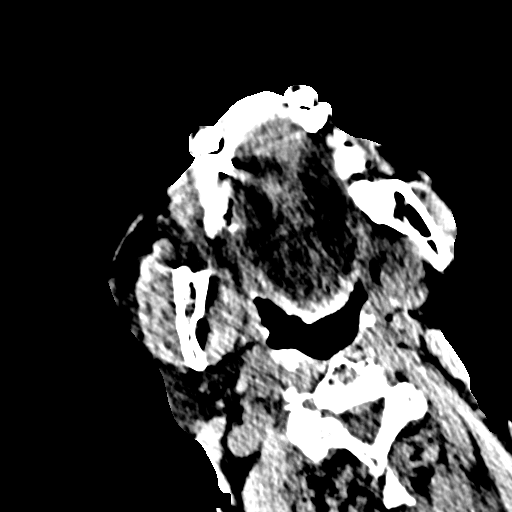
[im 41/86  brain]
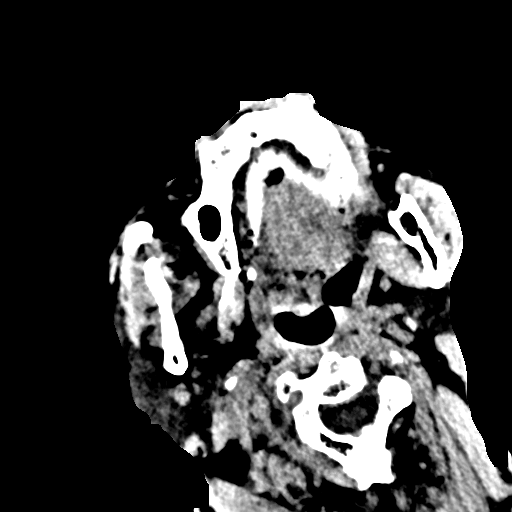
[im 41/86  bone]
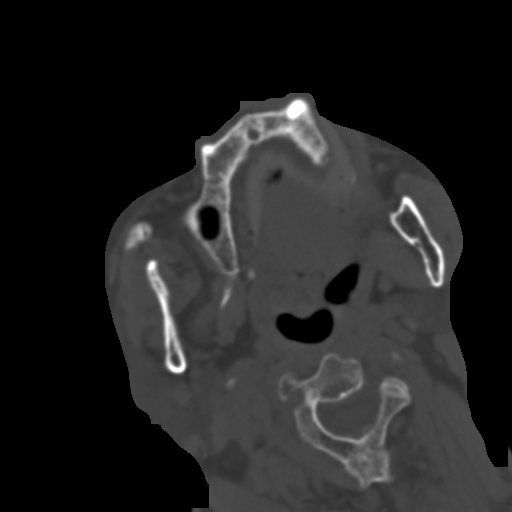
[im 49/86  brain]
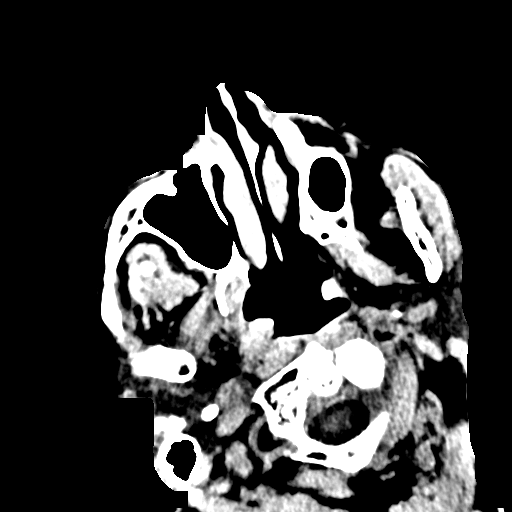
[im 57/86  brain]
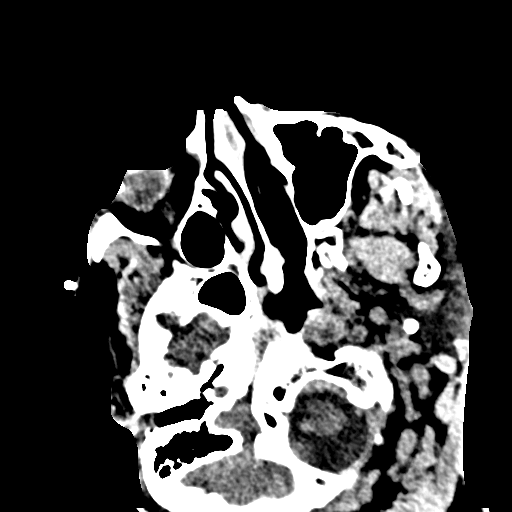
[im 65/86  brain]
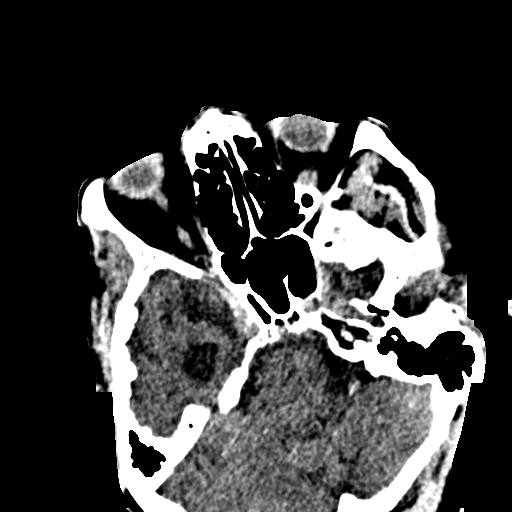
[im 73/86  brain]
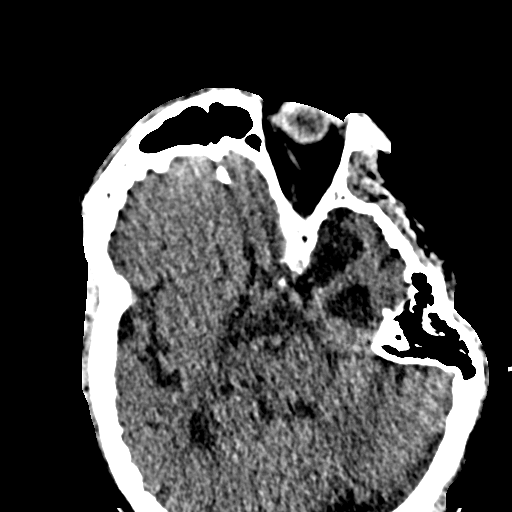
[im 73/86  bone]
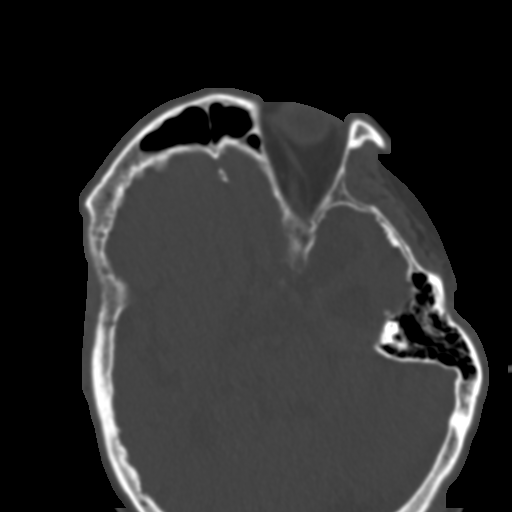
[im 81/86  brain]
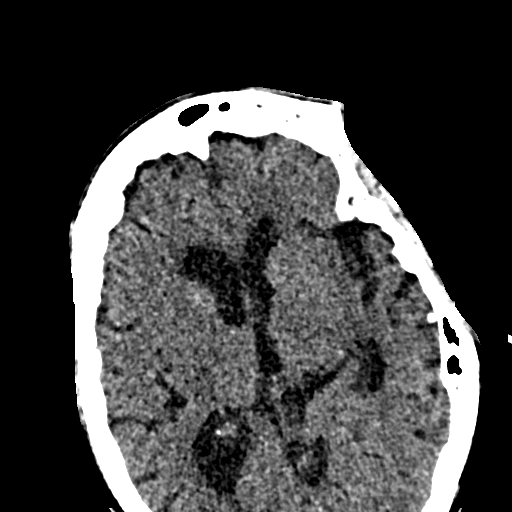

[Series 11: coronal soft · coronal · 0.36mm/px · 2 of 69 slices shown]
[im 9/69  brain]
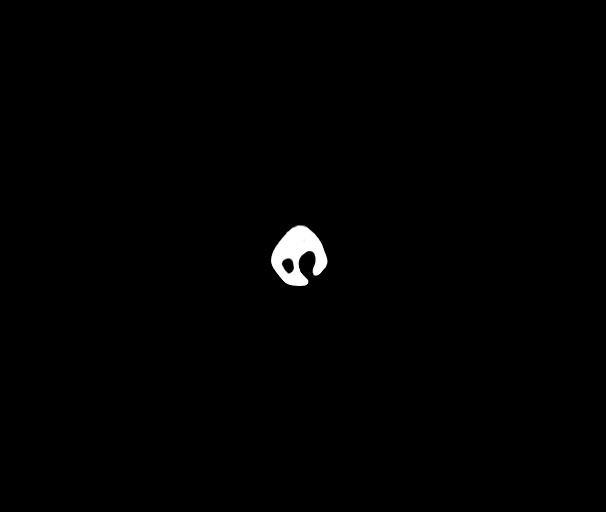
[im 39/69  brain]
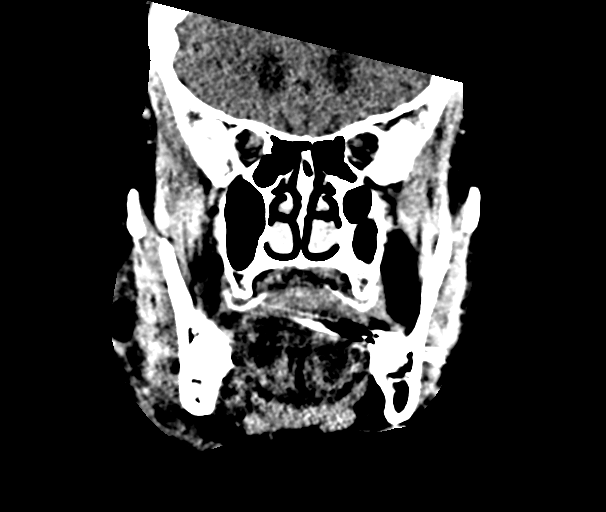

[15 of 47 positions shown; findings below may reference images not displayed]

FINDINGS: CT HEAD FINDINGS

Brain: No acute territorial infarction, hemorrhage or intracranial
mass is visualized. Moderate atrophy. Mild small vessel ischemic
changes of the white matter. Stable ventricle size.

Vascular: No hyperdense vessels.  Carotid artery calcification.

Skull: No fracture or suspicious lesion

Other: None

CT MAXILLOFACIAL FINDINGS

Osseous: Minimal anterior positioning of the mandibular heads with
respect to the mandibular fossa. No mandibular fracture is seen. No
nasal bone fracture. Pterygoid plates and zygomatic arches are
intact. Small fluid in the left mastoid air cells.

Orbits: Negative. No traumatic or inflammatory finding.

Sinuses: Clear.

Soft tissues: Mild to moderate right lower facial soft tissue
swelling. No focal fluid collections.
IMPRESSION: 1. No CT evidence for acute intracranial abnormality. Atrophy and
small vessel ischemic changes of the white matter
2. No acute facial bone fracture.
3. Moderate right lower facial soft tissue swelling, could relate to
trauma or cellulitis. No focal fluid collections are seen in this
region.

## 2019-07-21 IMAGING — CR DG CHEST 1V PORT
1 series · 1 of 1 positions shown · non-contrast
Comparison: 05/04/2017

CLINICAL DATA: Generalized weakness

EXAM:
PORTABLE CHEST 1 VIEW

[portable]
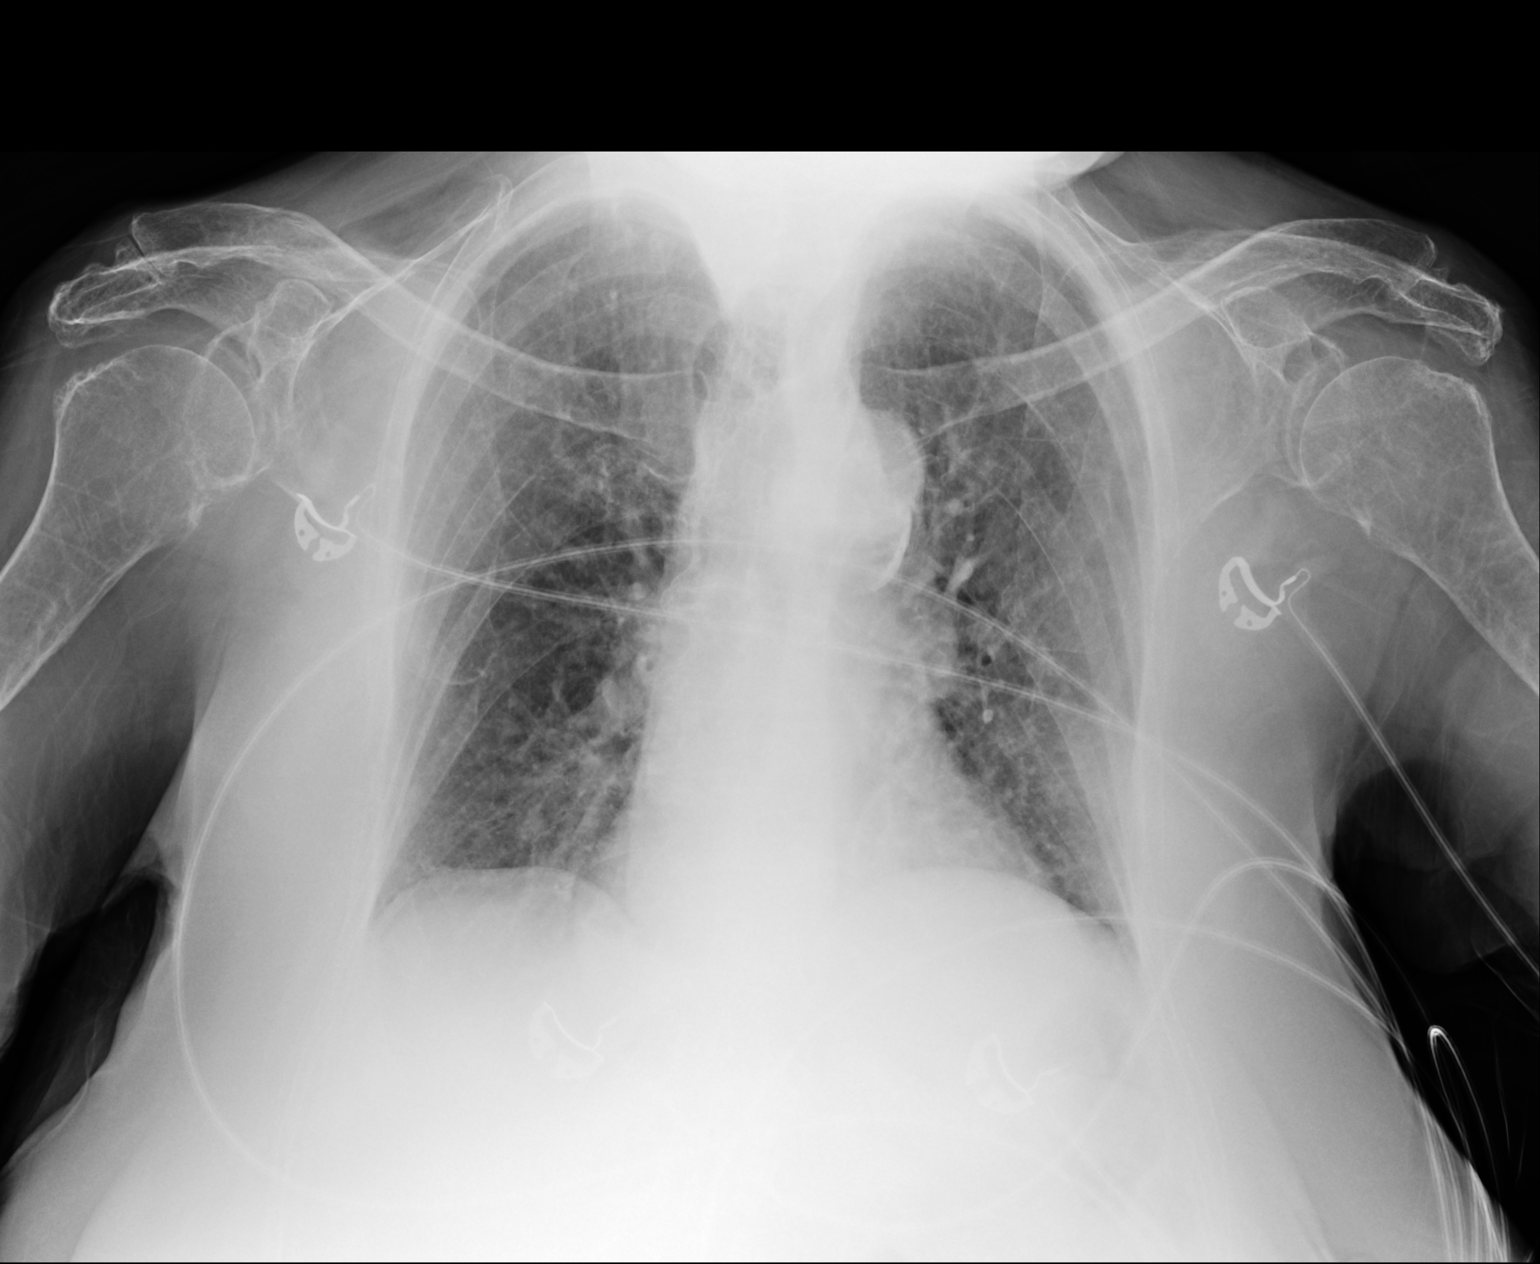

[1 of 1 positions shown; findings below may reference images not displayed]

FINDINGS: Coarse chronic interstitial opacity. No acute consolidation or
effusion. Stable mild cardiomegaly with aortic atherosclerosis. No
pneumothorax.
IMPRESSION: No active disease. Stable mild cardiomegaly and coarse chronic
appearing interstitial opacity.
